# Patient Record
Sex: Female | Born: 1960
Health system: Southern US, Community
[De-identification: ages and names within clinical notes are randomized; demographics above are authoritative.]

## PROBLEM LIST (undated history)

## (undated) DIAGNOSIS — K219 Gastro-esophageal reflux disease without esophagitis: Secondary | ICD-10-CM

## (undated) DIAGNOSIS — J449 Chronic obstructive pulmonary disease, unspecified: Secondary | ICD-10-CM

## (undated) DIAGNOSIS — R519 Headache, unspecified: Secondary | ICD-10-CM

## (undated) DIAGNOSIS — T7840XA Allergy, unspecified, initial encounter: Secondary | ICD-10-CM

## (undated) DIAGNOSIS — J45909 Unspecified asthma, uncomplicated: Secondary | ICD-10-CM

## (undated) DIAGNOSIS — R51 Headache: Secondary | ICD-10-CM

## (undated) HISTORY — PX: POLYPECTOMY: SHX149

## (undated) HISTORY — PX: BREAST SURGERY: SHX581

## (undated) HISTORY — DX: Headache, unspecified: R51.9

## (undated) HISTORY — DX: Headache: R51

## (undated) HISTORY — PX: FRACTURE SURGERY: SHX138

## (undated) HISTORY — DX: Chronic obstructive pulmonary disease, unspecified: J44.9

## (undated) HISTORY — DX: Allergy, unspecified, initial encounter: T78.40XA

## (undated) HISTORY — DX: Gastro-esophageal reflux disease without esophagitis: K21.9

---

## 2002-05-25 HISTORY — PX: ABDOMINAL HYSTERECTOMY: SHX81

## 2006-06-05 ENCOUNTER — Emergency Department (HOSPITAL_COMMUNITY): Admission: EM | Admit: 2006-06-05 | Discharge: 2006-06-05 | Payer: Self-pay | Admitting: Family Medicine

## 2006-10-19 ENCOUNTER — Encounter (HOSPITAL_COMMUNITY): Admission: RE | Admit: 2006-10-19 | Discharge: 2006-10-20 | Payer: Self-pay | Admitting: Internal Medicine

## 2010-10-04 DIAGNOSIS — K769 Liver disease, unspecified: Secondary | ICD-10-CM | POA: Insufficient documentation

## 2011-10-07 ENCOUNTER — Ambulatory Visit: Payer: Self-pay | Admitting: Family Medicine

## 2014-03-19 ENCOUNTER — Ambulatory Visit: Payer: Self-pay | Admitting: Gastroenterology

## 2014-08-25 ENCOUNTER — Encounter: Payer: Self-pay | Admitting: Obstetrics and Gynecology

## 2014-11-08 ENCOUNTER — Encounter: Payer: Self-pay | Admitting: Obstetrics and Gynecology

## 2014-11-13 ENCOUNTER — Encounter: Payer: Self-pay | Admitting: Family Medicine

## 2014-11-13 DIAGNOSIS — J449 Chronic obstructive pulmonary disease, unspecified: Secondary | ICD-10-CM | POA: Insufficient documentation

## 2014-11-13 DIAGNOSIS — F324 Major depressive disorder, single episode, in partial remission: Secondary | ICD-10-CM | POA: Insufficient documentation

## 2014-11-13 DIAGNOSIS — G43009 Migraine without aura, not intractable, without status migrainosus: Secondary | ICD-10-CM | POA: Insufficient documentation

## 2014-11-13 DIAGNOSIS — K219 Gastro-esophageal reflux disease without esophagitis: Secondary | ICD-10-CM | POA: Insufficient documentation

## 2014-11-13 DIAGNOSIS — F325 Major depressive disorder, single episode, in full remission: Secondary | ICD-10-CM | POA: Insufficient documentation

## 2014-11-13 DIAGNOSIS — Z8742 Personal history of other diseases of the female genital tract: Secondary | ICD-10-CM | POA: Insufficient documentation

## 2014-11-13 DIAGNOSIS — E559 Vitamin D deficiency, unspecified: Secondary | ICD-10-CM | POA: Insufficient documentation

## 2014-11-13 DIAGNOSIS — Z85828 Personal history of other malignant neoplasm of skin: Secondary | ICD-10-CM | POA: Insufficient documentation

## 2014-11-13 DIAGNOSIS — F900 Attention-deficit hyperactivity disorder, predominantly inattentive type: Secondary | ICD-10-CM | POA: Insufficient documentation

## 2014-11-16 ENCOUNTER — Ambulatory Visit (INDEPENDENT_AMBULATORY_CARE_PROVIDER_SITE_OTHER): Payer: BLUE CROSS/BLUE SHIELD | Admitting: Family Medicine

## 2014-11-16 ENCOUNTER — Encounter: Payer: Self-pay | Admitting: Family Medicine

## 2014-11-16 VITALS — BP 116/80 | HR 86 | Temp 97.9°F | Resp 18 | Ht 62.0 in | Wt 140.5 lb

## 2014-11-16 DIAGNOSIS — Z13220 Encounter for screening for lipoid disorders: Secondary | ICD-10-CM

## 2014-11-16 DIAGNOSIS — G43009 Migraine without aura, not intractable, without status migrainosus: Secondary | ICD-10-CM

## 2014-11-16 DIAGNOSIS — F900 Attention-deficit hyperactivity disorder, predominantly inattentive type: Secondary | ICD-10-CM | POA: Diagnosis not present

## 2014-11-16 DIAGNOSIS — J449 Chronic obstructive pulmonary disease, unspecified: Secondary | ICD-10-CM | POA: Diagnosis not present

## 2014-11-16 DIAGNOSIS — M7550 Bursitis of unspecified shoulder: Secondary | ICD-10-CM | POA: Diagnosis not present

## 2014-11-16 DIAGNOSIS — Z79899 Other long term (current) drug therapy: Secondary | ICD-10-CM

## 2014-11-16 DIAGNOSIS — K219 Gastro-esophageal reflux disease without esophagitis: Secondary | ICD-10-CM | POA: Diagnosis not present

## 2014-11-16 DIAGNOSIS — F329 Major depressive disorder, single episode, unspecified: Secondary | ICD-10-CM | POA: Diagnosis not present

## 2014-11-16 DIAGNOSIS — F32A Depression, unspecified: Secondary | ICD-10-CM

## 2014-11-16 MED ORDER — AMPHETAMINE-DEXTROAMPHETAMINE 10 MG PO TABS: 10.0000 mg | ORAL_TABLET | Freq: Three times a day (TID) | ORAL | Status: DC

## 2014-11-16 MED ORDER — DESVENLAFAXINE SUCCINATE ER 50 MG PO TB24: 50.0000 mg | ORAL_TABLET | Freq: Every day | ORAL | Status: DC

## 2014-11-16 MED ORDER — SUMATRIPTAN SUCCINATE 100 MG PO TABS: 100.0000 mg | ORAL_TABLET | ORAL | Status: DC | PRN

## 2014-11-16 MED ORDER — IPRATROPIUM-ALBUTEROL 20-100 MCG/ACT IN AERS: 1.0000 | INHALATION_SPRAY | Freq: Four times a day (QID) | RESPIRATORY_TRACT | Status: DC

## 2014-11-16 MED ORDER — ALPRAZOLAM 0.5 MG PO TABS
0.5000 mg | ORAL_TABLET | ORAL | Status: DC | PRN
Start: 2014-11-16 — End: 2015-02-19

## 2014-11-16 MED ORDER — TOPIRAMATE 100 MG PO TABS: 100.0000 mg | ORAL_TABLET | Freq: Two times a day (BID) | ORAL | Status: DC

## 2014-11-16 NOTE — Progress Notes (Signed)
Name: Jane Patrick   MRN: [id number]    DOB: 07-19-1960   Date:11/16/2014       Progress Note  Subjective  Chief Complaint  Chief Complaint  Patient presents with  . Depression  . Anxiety  . COPD  . Headache    worsening having 6 per month  . Arm Pain    left worsening over a month    HPI  Migraine Headaches:  She states symptoms have been more often , about once weekly. She does not take Imitrex right away and can have pain all day. Associated with nausea and occasionally vomiting. Taking Topamax. She states she has been more stressed lately. Changes at work .  COPD Mild: She has been taking Combivent and also walks daily to control her symptoms.  She states no cough, wheezing but occasionally has some SOB, worse with weather changes.  ADHD: taking medication as prescribed, no side effects, symptoms have been controlled.   Depression/Anxiety: she has been taking Pristiq daily and Alprazolam prn, she also has a dog at home that helps her cope with stress.  No suicidal thoughts or ideation, no anhedonia.   Bilateral upper arm pain: she had a high desk at work and over the past couple of months she has noticed pain with abduction of arms, and worse when laying on lateral decubitus at night. No redness, no swelling, no tingling or numbness or arms. No trauma.  GERD: not on medication because it does not work , controlled with life style modification  Patient Active Problem List   Diagnosis Date Noted  . ADD (attention deficit hyperactivity disorder, inattentive type) 11/13/2014  . COPD, mild 11/13/2014  . Clinical depression 11/13/2014  . Basal cell carcinoma 11/13/2014  . Gastroesophageal reflux disease without esophagitis 11/13/2014  . History of abnormal cervical Pap smear 11/13/2014  . Migraine without aura and without status migrainosus, not intractable 11/13/2014  . Vitamin D deficiency 11/13/2014    Past Surgical History  Procedure Laterality Date  . Abdominal  hysterectomy  2004    partial  . Fracture surgery      rode in femur  . Breast surgery      biopsies  . Polypectomy      Family History  Problem Relation Age of Onset  . Colon cancer Father   . Breast cancer Sister     History   Social History  . Marital Status: Married    Spouse Name: N/A  . Number of Children: N/A  . Years of Education: N/A   Occupational History  . Not on file.   Social History Main Topics  . Smoking status: Former Smoker -- 1.00 packs/day for 25 years    Types: Cigarettes    Quit date: 05/25/2004  . Smokeless tobacco: Not on file  . Alcohol Use: No  . Drug Use: No  . Sexual Activity: Yes   Other Topics Concern  . Not on file   Social History Narrative     Current outpatient prescriptions:  .  ALPRAZolam (XANAX) 0.5 MG tablet, Take 1 tablet (0.5 mg total) by mouth as needed for anxiety., Disp: 30 tablet, Rfl: 2 .  amphetamine-dextroamphetamine (ADDERALL) 10 MG tablet, Take 1 tablet (10 mg total) by mouth 3 (three) times daily. 2 in am and one in pm, Disp: 90 tablet, Rfl: 0 .  cholecalciferol (VITAMIN D) 1000 UNITS tablet, Take 2,000 Units by mouth daily., Disp: , Rfl:  .  desvenlafaxine (PRISTIQ) 50 MG 24 hr tablet,  Take 1 tablet (50 mg total) by mouth daily., Disp: 30 tablet, Rfl: 5 .  Ipratropium-Albuterol (COMBIVENT) 20-100 MCG/ACT AERS respimat, Inhale 1 puff into the lungs every 6 (six) hours., Disp: 1 Inhaler, Rfl: 5 .  SUMAtriptan (IMITREX) 100 MG tablet, Take 1 tablet (100 mg total) by mouth as needed for migraine. May repeat in 2 hours if headache persists or recurs., Disp: 10 tablet, Rfl: 5 .  topiramate (TOPAMAX) 100 MG tablet, Take 1 tablet (100 mg total) by mouth 2 (two) times daily., Disp: 60 tablet, Rfl: 5 .  amphetamine-dextroamphetamine (ADDERALL) 10 MG tablet, Take 1 tablet (10 mg total) by mouth 3 (three) times daily. 2 in am and one in pm, Disp: 90 tablet, Rfl: 0 .  amphetamine-dextroamphetamine (ADDERALL) 10 MG tablet, Take 1  tablet (10 mg total) by mouth 3 (three) times daily. 2 in am and one in pm, Disp: 90 tablet, Rfl: 0  Allergies  Allergen Reactions  . Codeine      ROS  Constitutional: Negative for fever no significant weight change.  Respiratory: Negative for cough occasionally  shortness of breath.   Cardiovascular: Negative for chest pain or palpitations.  Gastrointestinal: Negative for abdominal pain, no bowel changes.  Musculoskeletal: Negative for gait problem or joint swelling.  Skin: Negative for rash.  Neurological: Negative for dizziness. Positive for migraine  headache.  No other specific complaints in a complete review of systems (except as listed in HPI above).  Objective  Filed Vitals:   11/16/14 0822  BP: 116/80  Pulse: 86  Temp: 97.9 F (36.6 C)  TempSrc: Oral  Resp: 18  Height:  (1.575 m)  Weight: 140 lb 8 oz (63.73 kg)  SpO2: 98%    Body mass index is 25.69 kg/(m^2).  Physical Exam   Constitutional: Patient appears well-developed and well-nourished. No distress.  Eyes:  No scleral icterus.  Neck: Normal range of motion. Neck supple. Cardiovascular: Normal rate, regular rhythm and normal heart sounds.  No murmur heard. No BLE edema. Pulmonary/Chest: Effort normal and breath sounds normal. No respiratory distress. Abdominal: Soft.  There is no tenderness. Psychiatric: Patient has a normal mood and affect. behavior is normal. Judgment and thought content normal. Muscular skeletal : pain during palpation of deltoid bilaterally. Pain with abduction of shoulders, no redness.   PHQ2/9: Depression screen PHQ 2/9 11/16/2014  Decreased Interest 0  Down, Depressed, Hopeless 0  PHQ - 2 Score 0    Fall Risk: Fall Risk  11/16/2014  Falls in the past year? No    Assessment & Plan   1. Migraine without aura and without status migrainosus, not intractable Increased secondary to stress, advised to take Imitrex as soon as symptoms starts.  - topiramate (TOPAMAX) 100  MG tablet; Take 1 tablet (100 mg total) by mouth 2 (two) times daily.  Dispense: 60 tablet; Refill: 5 - SUMAtriptan (IMITREX) 100 MG tablet; Take 1 tablet (100 mg total) by mouth as needed for migraine. May repeat in 2 hours if headache persists or recurs.  Dispense: 10 tablet; Refill: 5  2. Gastroesophageal reflux disease without esophagitis stable  3. Clinical depression We will wrist a letter stating that she needs a emotional support dog to help with her symptoms - ALPRAZolam (XANAX) 0.5 MG tablet; Take 1 tablet (0.5 mg total) by mouth as needed for anxiety.  Dispense: 30 tablet; Refill: 2  4. COPD, mild  - Ipratropium-Albuterol (COMBIVENT) 20-100 MCG/ACT AERS respimat; Inhale 1 puff into the lungs every 6 (six)  hours.  Dispense: 1 Inhaler; Refill: 5  5. ADD (attention deficit hyperactivity disorder, inattentive type)  - desvenlafaxine (PRISTIQ) 50 MG 24 hr tablet; Take 1 tablet (50 mg total) by mouth daily.  Dispense: 30 tablet; Refill: 5 - amphetamine-dextroamphetamine (ADDERALL) 10 MG tablet; Take 1 tablet (10 mg total) by mouth 3 (three) times daily. 2 in am and one in pm  Dispense: 90 tablet; Refill: 0 - amphetamine-dextroamphetamine (ADDERALL) 10 MG tablet; Take 1 tablet (10 mg total) by mouth 3 (three) times daily. 2 in am and one in pm  Dispense: 90 tablet; Refill: 0 - amphetamine-dextroamphetamine (ADDERALL) 10 MG tablet; Take 1 tablet (10 mg total) by mouth 3 (three) times daily. 2 in am and one in pm  Dispense: 90 tablet; Refill: 0  6. Lipid screening  - Lipid Profile  7. Long-term use of high-risk medication  - Comprehensive Metabolic Panel (CMET)  8. Bursitis/tendonitis, shoulder, unspecified laterality Discussed PT referral, injections, or otc ice and nsaid's prn, she wants to follow the later. She will also try a different desk at work , since she is constantly typing.

## 2014-11-16 NOTE — Patient Instructions (Signed)

## 2014-11-20 LAB — COMPREHENSIVE METABOLIC PANEL
A/G RATIO: 1.9 (ref 1.1–2.5)
ALK PHOS: 97 IU/L (ref 39–117)
ALT: 11 IU/L (ref 0–32)
AST: 12 IU/L (ref 0–40)
Albumin: 4.2 g/dL (ref 3.5–5.5)
BUN / CREAT RATIO: 22 (ref 9–23)
BUN: 19 mg/dL (ref 6–24)
Bilirubin Total: 0.2 mg/dL (ref 0.0–1.2)
CHLORIDE: 107 mmol/L (ref 97–108)
CO2: 25 mmol/L (ref 18–29)
CREATININE: 0.86 mg/dL (ref 0.57–1.00)
Calcium: 9.4 mg/dL (ref 8.7–10.2)
GFR, EST AFRICAN AMERICAN: 90 mL/min/{1.73_m2} (ref >59)
GFR, EST NON AFRICAN AMERICAN: 78 mL/min/{1.73_m2} (ref >59)
GLOBULIN, TOTAL: 2.2 g/dL (ref 1.5–4.5)
GLUCOSE: 89 mg/dL (ref 65–99)
POTASSIUM: 4.1 mmol/L (ref 3.5–5.2)
Sodium: 146 mmol/L — ABNORMAL HIGH (ref 134–144)
Total Protein: 6.4 g/dL (ref 6.0–8.5)

## 2014-11-20 LAB — LIPID PANEL
CHOL/HDL RATIO: 4.3 ratio (ref 0.0–4.4)
Cholesterol, Total: 201 mg/dL — ABNORMAL HIGH (ref 100–199)
HDL: 47 mg/dL (ref >39)
LDL Calculated: 123 mg/dL — ABNORMAL HIGH (ref 0–99)
Triglycerides: 157 mg/dL — ABNORMAL HIGH (ref 0–149)
VLDL CHOLESTEROL CAL: 31 mg/dL (ref 5–40)

## 2014-11-21 ENCOUNTER — Telehealth: Payer: Self-pay

## 2014-11-21 DIAGNOSIS — R5383 Other fatigue: Secondary | ICD-10-CM

## 2014-11-21 NOTE — Telephone Encounter (Signed)
Patient notified and wanted to ask if we could add a Vitamin D to her existing blood work due to being Fatigue.

## 2014-11-21 NOTE — Telephone Encounter (Signed)
-----   Message from Alba CoryKrichna Sowles, MD sent at 11/21/2014  3:02 PM EDT ----- Lipid panel is at goal for her, ASCVD is 1.5% in the next 10 years, no need for statins at this time Sugar , kidney and liver functions are within normal limits Please notify patient, thank you

## 2014-11-22 NOTE — Telephone Encounter (Signed)
I ordered CBC, vitamin D , B12 and TSH if you can add it to current labs. Thank you

## 2014-11-22 NOTE — Telephone Encounter (Signed)
We could add all the chemistry labs, Vitamin D, B12, and TSH. But she did not have any lavendar's draw so they could add the CBC.

## 2014-11-23 LAB — VITAMIN B12: VITAMIN B 12: 201 pg/mL — AB (ref 211–946)

## 2014-11-23 LAB — TSH: TSH: 4.22 u[IU]/mL (ref 0.450–4.500)

## 2014-11-23 LAB — VITAMIN D 25 HYDROXY (VIT D DEFICIENCY, FRACTURES): VIT D 25 HYDROXY: 32.2 ng/mL (ref 30.0–100.0)

## 2014-11-23 NOTE — Progress Notes (Signed)
Patient notified

## 2014-11-24 LAB — SPECIMEN STATUS REPORT

## 2014-11-27 ENCOUNTER — Telehealth: Payer: Self-pay | Admitting: Family Medicine

## 2014-11-27 NOTE — Telephone Encounter (Signed)
She can come in for B12 shot

## 2014-11-27 NOTE — Telephone Encounter (Signed)
Pt was told that her b-12 level was low and was advised to take something over the counter, however patient is not able to find it and is requesting to come in for a b-12 shot. Is it possible for you to write her a prescription for this? Please return call once complete and it is okay to leave a detailed message. [phone]

## 2014-11-28 ENCOUNTER — Encounter: Payer: Self-pay | Admitting: Family Medicine

## 2014-11-28 ENCOUNTER — Ambulatory Visit (INDEPENDENT_AMBULATORY_CARE_PROVIDER_SITE_OTHER): Payer: BLUE CROSS/BLUE SHIELD

## 2014-11-28 DIAGNOSIS — D519 Vitamin B12 deficiency anemia, unspecified: Secondary | ICD-10-CM

## 2014-11-28 MED ORDER — CYANOCOBALAMIN 1000 MCG/ML IJ SOLN
1000.0000 ug | Freq: Once | INTRAMUSCULAR | Status: AC
  Administered 2014-11-28: 1000 ug via INTRAMUSCULAR

## 2015-01-29 ENCOUNTER — Other Ambulatory Visit: Payer: Self-pay

## 2015-01-29 DIAGNOSIS — F900 Attention-deficit hyperactivity disorder, predominantly inattentive type: Secondary | ICD-10-CM

## 2015-02-19 ENCOUNTER — Other Ambulatory Visit: Payer: Self-pay

## 2015-02-19 ENCOUNTER — Encounter: Payer: Self-pay | Admitting: Family Medicine

## 2015-02-19 ENCOUNTER — Ambulatory Visit (INDEPENDENT_AMBULATORY_CARE_PROVIDER_SITE_OTHER): Payer: 59 | Admitting: Family Medicine

## 2015-02-19 VITALS — BP 104/70 | HR 82 | Temp 97.9°F | Resp 16 | Ht 62.0 in | Wt 146.6 lb

## 2015-02-19 DIAGNOSIS — F900 Attention-deficit hyperactivity disorder, predominantly inattentive type: Secondary | ICD-10-CM

## 2015-02-19 DIAGNOSIS — J449 Chronic obstructive pulmonary disease, unspecified: Secondary | ICD-10-CM

## 2015-02-19 DIAGNOSIS — E559 Vitamin D deficiency, unspecified: Secondary | ICD-10-CM | POA: Diagnosis not present

## 2015-02-19 DIAGNOSIS — Z23 Encounter for immunization: Secondary | ICD-10-CM

## 2015-02-19 DIAGNOSIS — F32A Depression, unspecified: Secondary | ICD-10-CM

## 2015-02-19 DIAGNOSIS — G43009 Migraine without aura, not intractable, without status migrainosus: Secondary | ICD-10-CM

## 2015-02-19 DIAGNOSIS — R319 Hematuria, unspecified: Secondary | ICD-10-CM

## 2015-02-19 DIAGNOSIS — E538 Deficiency of other specified B group vitamins: Secondary | ICD-10-CM | POA: Diagnosis not present

## 2015-02-19 DIAGNOSIS — F329 Major depressive disorder, single episode, unspecified: Secondary | ICD-10-CM | POA: Diagnosis not present

## 2015-02-19 DIAGNOSIS — Z8742 Personal history of other diseases of the female genital tract: Secondary | ICD-10-CM | POA: Diagnosis not present

## 2015-02-19 DIAGNOSIS — F324 Major depressive disorder, single episode, in partial remission: Secondary | ICD-10-CM

## 2015-02-19 DIAGNOSIS — K7689 Other specified diseases of liver: Secondary | ICD-10-CM

## 2015-02-19 DIAGNOSIS — K769 Liver disease, unspecified: Secondary | ICD-10-CM

## 2015-02-19 MED ORDER — SUMATRIPTAN SUCCINATE 100 MG PO TABS: 100.0000 mg | ORAL_TABLET | ORAL | Status: DC | PRN

## 2015-02-19 MED ORDER — IPRATROPIUM-ALBUTEROL 20-100 MCG/ACT IN AERS: 1.0000 | INHALATION_SPRAY | Freq: Four times a day (QID) | RESPIRATORY_TRACT | Status: DC

## 2015-02-19 MED ORDER — TOPIRAMATE 100 MG PO TABS: 100.0000 mg | ORAL_TABLET | Freq: Two times a day (BID) | ORAL | Status: DC

## 2015-02-19 MED ORDER — AMPHETAMINE-DEXTROAMPHETAMINE 10 MG PO TABS: 10.0000 mg | ORAL_TABLET | Freq: Three times a day (TID) | ORAL | Status: DC

## 2015-02-19 MED ORDER — CYANOCOBALAMIN 1000 MCG/ML IJ SOLN
1000.0000 ug | Freq: Once | INTRAMUSCULAR | Status: AC
  Administered 2015-02-19: 1000 ug via INTRAMUSCULAR

## 2015-02-19 MED ORDER — VITAMIN B-12 1000 MCG PO TABS: 1000.0000 ug | ORAL_TABLET | Freq: Every day | ORAL | Status: DC

## 2015-02-19 MED ORDER — ALPRAZOLAM 0.5 MG PO TABS: 0.5000 mg | ORAL_TABLET | Freq: Every evening | ORAL | Status: DC | PRN

## 2015-02-19 MED ORDER — DESVENLAFAXINE SUCCINATE ER 50 MG PO TB24: 50.0000 mg | ORAL_TABLET | Freq: Every day | ORAL | Status: DC

## 2015-02-19 NOTE — Progress Notes (Signed)
Name: Jane Patrick   MRN: [id number]    DOB: [birth year]   Date:02/19/2015       Progress Note  Subjective  Chief Complaint  Chief Complaint  Patient presents with  . Medication Refill    follow-up  . Depression  . Migraine  . ADHD    HPI  Abnormal pap smear in 19-Oct-2010: she was referred to [facility], and advised to see [name] but never followed through.  Explained the risk of worsening of vaginal wall pathology/caner. She has a TAH in 10-19-2002 for treatment of abnormal cervical cells  Hematuria: seen at [facility] in 10-19-2010 and advised by them to follow up in 6 months, but did not go back, she does not want to go back now. She has a long history of hematuria and in one of the studies she has a liver lesion, but was offered multiple times to have a repeat CT scan but she continues to refuse.   ADD: she now has a new job. Working for [employer]. They are located in [place], she works from home. She states medication helps her focus.   COPD Mild: occasional SOB with weather changes, no cough, no decrease in exercise tolerance. No longer smoking.  She uses Combivent daily for symptoms.   B12 deficiency: found in June 2016, had one B12 injection and felt better, still taking otc SL but would like another injection  Major Depression in partial Remission: on Pristiq and Alprazolam prn. Symptoms started when husband was diagnosed with lung cancer and died in 10/18/08.  She is now mostly stressed with learning a new job. No crying spells, but she has lack of energy, she has some anhedonia.    Patient Active Problem List   Diagnosis Date Noted  . Hematuria 02/19/2015  . B12 deficiency 02/19/2015  . ADD (attention deficit hyperactivity disorder, inattentive type) 11/13/2014  . COPD, mild 11/13/2014  . Major depression in partial remission 11/13/2014  . History of basal cell carcinoma 11/13/2014  . Gastroesophageal reflux disease without esophagitis 11/13/2014  . History of abnormal  cervical Pap smear 11/13/2014  . Migraine without aura and without status migrainosus, not intractable 11/13/2014  . Vitamin D deficiency 11/13/2014  . Liver lesion 10/04/2010    Past Surgical History  Procedure Laterality Date  . Abdominal hysterectomy  2002-10-19    partial  . Fracture surgery      rode in femur  . Breast surgery      biopsies  . Polypectomy      Family History  Problem Relation Age of Onset  . Colon cancer Father   . Breast cancer Sister     Social History   Social History  . Marital Status: Married    Spouse Name: N/A  . Number of Children: N/A  . Years of Education: N/A   Occupational History  . Not on file.   Social History Main Topics  . Smoking status: Former Smoker -- 1.00 packs/day for 25 years    Types: Cigarettes    Quit date: 05/25/2004  . Smokeless tobacco: Never Used  . Alcohol Use: No  . Drug Use: No  . Sexual Activity: Yes   Other Topics Concern  . Not on file   Social History Narrative     Current outpatient prescriptions:  .  ALPRAZolam (XANAX) 0.5 MG tablet, Take 1 tablet (0.5 mg total) by mouth at bedtime as needed for anxiety., Disp: 30 tablet, Rfl: 2 .  amphetamine-dextroamphetamine (ADDERALL) 10  MG tablet, Take 1 tablet (10 mg total) by mouth 3 (three) times daily. 2 in am and one in pm, Disp: 90 tablet, Rfl: 0 .  amphetamine-dextroamphetamine (ADDERALL) 10 MG tablet, Take 1 tablet (10 mg total) by mouth 3 (three) times daily. 2 in am and one in pm, Disp: 90 tablet, Rfl: 0 .  amphetamine-dextroamphetamine (ADDERALL) 10 MG tablet, Take 1 tablet (10 mg total) by mouth 3 (three) times daily. 2 in am and one in pm, Disp: 90 tablet, Rfl: 0 .  cholecalciferol (VITAMIN D) 1000 UNITS tablet, Take 2,000 Units by mouth daily., Disp: , Rfl:  .  desvenlafaxine (PRISTIQ) 50 MG 24 hr tablet, Take 1 tablet (50 mg total) by mouth daily., Disp: 30 tablet, Rfl: 5 .  Ipratropium-Albuterol (COMBIVENT) 20-100 MCG/ACT AERS respimat, Inhale 1 puff  into the lungs every 6 (six) hours., Disp: 3 Inhaler, Rfl: 1 .  SUMAtriptan (IMITREX) 100 MG tablet, Take 1 tablet (100 mg total) by mouth as needed for migraine. May repeat in 2 hours if headache persists or recurs., Disp: 18 tablet, Rfl: 1 .  topiramate (TOPAMAX) 100 MG tablet, Take 1 tablet (100 mg total) by mouth 2 (two) times daily., Disp: 180 tablet, Rfl: 1 .  vitamin B-12 (CYANOCOBALAMIN) 1000 MCG tablet, Take 1 tablet (1,000 mcg total) by mouth daily., Disp: 30 tablet, Rfl: 0  Allergies  Allergen Reactions  . Codeine   . Pantoprazole     Flu like syptoms     ROS  Constitutional: Negative for fever or weight change.  Respiratory: Negative for cough and shortness of breath.   Cardiovascular: Negative for chest pain or palpitations.  Gastrointestinal: Negative for abdominal pain, no bowel changes.  Musculoskeletal: Negative for gait problem or joint swelling.  Skin: Negative for rash.  Neurological: Negative for dizziness or headache.  No other specific complaints in a complete review of systems (except as listed in HPI above).  Objective  Filed Vitals:   02/19/15 0811  BP: 104/70  Pulse: 82  Temp: 97.9 F (36.6 C)  TempSrc: Oral  Resp: 16  Height:  (1.575 m)  Weight: 146 lb 9.6 oz (66.497 kg)  SpO2: 95%    Body mass index is 26.81 kg/(m^2).  Physical Exam  Constitutional: Patient appears well-developed and well-nourished. No distress.  HEENT: head atraumatic, normocephalic, pupils equal and reactive to light, neck supple, throat within normal limits Cardiovascular: Normal rate, regular rhythm and normal heart sounds.  No murmur heard. No BLE edema. Pulmonary/Chest: Effort normal and breath sounds normal. No respiratory distress. Abdominal: Soft.  There is no tenderness. Psychiatric: Patient has a normal mood and affect. behavior is normal. Judgment and thought content normal.  PHQ2/9: Depression screen [facility] 2/9 02/19/2015 11/16/2014  Decreased Interest 0 0   Down, Depressed, Hopeless 0 0  PHQ - 2 Score 0 0    Fall Risk: Fall Risk  02/19/2015 11/16/2014 11/16/2014  Falls in the past year? No No No    Functional Status Survey: Is the patient deaf or have difficulty hearing?: No Does the patient have difficulty seeing, even when wearing glasses/contacts?: Yes (reading glasses) Does the patient have difficulty concentrating, remembering, or making decisions?: No Does the patient have difficulty walking or climbing stairs?: No Does the patient have difficulty dressing or bathing?: No Does the patient have difficulty doing errands alone such as visiting a doctor's office or shopping?: No    Assessment & Plan  1. History of abnormal cervical Pap smear  - Ambulatory  referral to Gynecology  2. Needs flu shot  - Flu Vaccine QUAD 36+ mos PF IM (Fluarix & Fluzone Quad PF) - refused  3. Liver lesion  Refuses to have CT at this time  4. ADD (attention deficit hyperactivity disorder, inattentive type)  Doing well on medication  - desvenlafaxine (PRISTIQ) 50 MG 24 hr tablet; Take 1 tablet (50 mg total) by mouth daily.  Dispense: 30 tablet; Refill: 5 - amphetamine-dextroamphetamine (ADDERALL) 10 MG tablet; Take 1 tablet (10 mg total) by mouth 3 (three) times daily. 2 in am and one in pm  Dispense: 90 tablet; Refill: 0 - amphetamine-dextroamphetamine (ADDERALL) 10 MG tablet; Take 1 tablet (10 mg total) by mouth 3 (three) times daily. 2 in am and one in pm  Dispense: 90 tablet; Refill: 0 - amphetamine-dextroamphetamine (ADDERALL) 10 MG tablet; Take 1 tablet (10 mg total) by mouth 3 (three) times daily. 2 in am and one in pm  Dispense: 90 tablet; Refill: 0  5. Major depressive disorder, single episode, in partial remission  _Pristiq 50 mg daily  - ALPRAZolam (XANAX) 0.5 MG tablet; Take 1 tablet (0.5 mg total) by mouth at bedtime as needed for anxiety.  Dispense: 30 tablet; Refill: 2  6. Vitamin D deficiency   continue medication daily   7.  Migraine without aura and without status migrainosus, not intractable  Doing well at this time, only one or two episodes in the past 3 months - topiramate (TOPAMAX) 100 MG tablet; Take 1 tablet (100 mg total) by mouth 2 (two) times daily.  Dispense: 180 tablet; Refill: 1 - SUMAtriptan (IMITREX) 100 MG tablet; Take 1 tablet (100 mg total) by mouth as needed for migraine. May repeat in 2 hours if headache persists or recurs.  Dispense: 18 tablet; Refill: 1  8. Hematuria  Discussed importance of going back to Urologist at [facility] - Dr. Solmon Ice, but she still does not want to go  9. COPD, mild  - Ipratropium-Albuterol (COMBIVENT) 20-100 MCG/ACT AERS respimat; Inhale 1 puff into the lungs every 6 (six) hours.  Dispense: 3 Inhaler; Refill: 1   10. B12 deficiency

## 2015-02-19 NOTE — Telephone Encounter (Signed)
Patient requesting refill. 

## 2015-02-20 ENCOUNTER — Other Ambulatory Visit: Payer: Self-pay

## 2015-02-20 DIAGNOSIS — J449 Chronic obstructive pulmonary disease, unspecified: Secondary | ICD-10-CM

## 2015-02-20 MED ORDER — IPRATROPIUM-ALBUTEROL 20-100 MCG/ACT IN AERS: 1.0000 | INHALATION_SPRAY | Freq: Four times a day (QID) | RESPIRATORY_TRACT | Status: DC

## 2015-05-22 ENCOUNTER — Encounter: Payer: Self-pay | Admitting: Family Medicine

## 2015-05-22 ENCOUNTER — Ambulatory Visit (INDEPENDENT_AMBULATORY_CARE_PROVIDER_SITE_OTHER): Payer: 59 | Admitting: Family Medicine

## 2015-05-22 VITALS — BP 102/64 | HR 96 | Temp 98.2°F | Resp 16 | Ht 62.0 in | Wt 147.7 lb

## 2015-05-22 DIAGNOSIS — J449 Chronic obstructive pulmonary disease, unspecified: Secondary | ICD-10-CM

## 2015-05-22 DIAGNOSIS — G43009 Migraine without aura, not intractable, without status migrainosus: Secondary | ICD-10-CM

## 2015-05-22 DIAGNOSIS — Z9119 Patient's noncompliance with other medical treatment and regimen: Secondary | ICD-10-CM | POA: Diagnosis not present

## 2015-05-22 DIAGNOSIS — F900 Attention-deficit hyperactivity disorder, predominantly inattentive type: Secondary | ICD-10-CM

## 2015-05-22 DIAGNOSIS — F324 Major depressive disorder, single episode, in partial remission: Secondary | ICD-10-CM

## 2015-05-22 DIAGNOSIS — Z8742 Personal history of other diseases of the female genital tract: Secondary | ICD-10-CM

## 2015-05-22 DIAGNOSIS — Z91199 Patient's noncompliance with other medical treatment and regimen due to unspecified reason: Secondary | ICD-10-CM

## 2015-05-22 MED ORDER — AMPHETAMINE-DEXTROAMPHETAMINE 10 MG PO TABS: 10.0000 mg | ORAL_TABLET | Freq: Three times a day (TID) | ORAL | Status: DC

## 2015-05-22 NOTE — Progress Notes (Signed)
Name: Jane Patrick   MRN: [id number]    DOB: [birth year]   Date:05/22/2015       Progress Note  Subjective  Chief Complaint  Chief Complaint  Patient presents with  . Medication Refill    3 month follow-up  . ADHD  . Depression  . Migraine  . Asthma    HPI   Migraine headache: she is on Topamax for prevention, described as throbbing, usually triggered by barometric pressure changes. Imitrex helps with symptoms, takes about 45 minutes to resolve the symptoms.   Abnormal pap smear in 27-Sep-2010: she was referred to [facility], and advised to see [name] but never followed through. Explained the risk of worsening of vaginal wall pathology/caner. She has a TAH in 09-27-02 for treatment of abnormal cervical cells. She has missed another appointment and is trying to see a local GYN, [name] at [facility] before going to [facility] - she will schedule the appointment today  Hematuria: seen at [facility] in 2010-09-27 and advised by them to follow up in 6 months, but did not go back, she does not want to go back now. She has a long history of hematuria and in one of the studies she has a liver lesion, but was offered multiple times to have a repeat CT scan but she continues to refuse. She still has not gone back yet. She wants to check coverage with her insurance first  ADD: she now has a new job. Working for [employer]. They are located in [place], she works from home. She states medication helps her focus, adjusting well to the new job. No side effects of medication   COPD Mild: occasional SOB , no cough, no wheezing, no decrease in exercise tolerance. No longer smoking. She uses Combivent daily for symptoms - usually for SOB.   B12 deficiency: found in June 2016, had one B12 injection and felt better, still taking otc SL but would like another injection  Major Depression in partial Remission: on Pristiq and Alprazolam prn. Symptoms started when husband was diagnosed with lung cancer and died  in 2008/09/26.She does not want to change medication or see a Psychiatrist.  She is now mostly stressed with learning a new job. No crying spells, but she has lack of energy, she has some anhedonia.   Patient Active Problem List   Diagnosis Date Noted  . Hematuria 02/19/2015  . B12 deficiency 02/19/2015  . ADD (attention deficit hyperactivity disorder, inattentive type) 11/13/2014  . COPD, mild (HCC) 11/13/2014  . Major depression in partial remission (HCC) 11/13/2014  . History of basal cell carcinoma 11/13/2014  . Gastroesophageal reflux disease without esophagitis 11/13/2014  . History of abnormal cervical Pap smear 11/13/2014  . Migraine without aura and without status migrainosus, not intractable 11/13/2014  . Vitamin D deficiency 11/13/2014  . Liver lesion 10/04/2010    Past Surgical History  Procedure Laterality Date  . Abdominal hysterectomy  09-27-02    partial  . Fracture surgery      rode in femur  . Breast surgery      biopsies  . Polypectomy      Family History  Problem Relation Age of Onset  . Colon cancer Father   . Breast cancer Sister     Social History   Social History  . Marital Status: Married    Spouse Name: N/A  . Number of Children: N/A  . Years of Education: N/A   Occupational History  . Not on  file.   Social History Main Topics  . Smoking status: Former Smoker -- 1.00 packs/day for 25 years    Types: Cigarettes    Quit date: 05/25/2004  . Smokeless tobacco: Never Used  . Alcohol Use: No  . Drug Use: No  . Sexual Activity: Yes   Other Topics Concern  . Not on file   Social History Narrative     Current outpatient prescriptions:  .  ALPRAZolam (XANAX) 0.5 MG tablet, Take 1 tablet (0.5 mg total) by mouth at bedtime as needed for anxiety., Disp: 30 tablet, Rfl: 2 .  amphetamine-dextroamphetamine (ADDERALL) 10 MG tablet, Take 1 tablet (10 mg total) by mouth 3 (three) times daily. 2 in am and one in pm, Disp: 90 tablet, Rfl: 0 .   amphetamine-dextroamphetamine (ADDERALL) 10 MG tablet, Take 1 tablet (10 mg total) by mouth 3 (three) times daily. 2 in am and one in pm, Disp: 90 tablet, Rfl: 0 .  amphetamine-dextroamphetamine (ADDERALL) 10 MG tablet, Take 1 tablet (10 mg total) by mouth 3 (three) times daily. 2 in am and one in pm, Disp: 90 tablet, Rfl: 0 .  cholecalciferol (VITAMIN D) 1000 UNITS tablet, Take 2,000 Units by mouth daily., Disp: , Rfl:  .  desvenlafaxine (PRISTIQ) 50 MG 24 hr tablet, Take 1 tablet (50 mg total) by mouth daily., Disp: 30 tablet, Rfl: 5 .  Ipratropium-Albuterol (COMBIVENT) 20-100 MCG/ACT AERS respimat, Inhale 1 puff into the lungs every 6 (six) hours., Disp: 3 Inhaler, Rfl: 1 .  SUMAtriptan (IMITREX) 100 MG tablet, Take 1 tablet (100 mg total) by mouth as needed for migraine. May repeat in 2 hours if headache persists or recurs., Disp: 18 tablet, Rfl: 1 .  topiramate (TOPAMAX) 100 MG tablet, Take 1 tablet (100 mg total) by mouth 2 (two) times daily., Disp: 180 tablet, Rfl: 1 .  vitamin B-12 (CYANOCOBALAMIN) 1000 MCG tablet, Take 1 tablet (1,000 mcg total) by mouth daily., Disp: 30 tablet, Rfl: 0  Allergies  Allergen Reactions  . Codeine   . Pantoprazole     Flu like syptoms     ROS  Constitutional: Negative for fever or weight change.  Respiratory: Negative for cough , occasional shortness of breath.   Cardiovascular: Negative for chest pain or palpitations.  Gastrointestinal: Negative for abdominal pain, no bowel changes.  Musculoskeletal: Negative for gait problem or joint swelling.  Skin: Negative for rash.  Neurological: Negative for dizziness or headache.  No other specific complaints in a complete review of systems (except as listed in HPI above).  Objective  Filed Vitals:   05/22/15 0807  BP: 102/64  Pulse: 96  Temp: 98.2 F (36.8 C)  TempSrc: Oral  Resp: 16  Height: 5\' 2"  (1.575 m)  Weight: 147 lb 11.2 oz (66.996 kg)  SpO2: 96%    Body mass index is 27.01  kg/(m^2).  Physical Exam  Constitutional: Patient appears well-developed and well-nourished.  No distress.  HEENT: head atraumatic, normocephalic, pupils equal and reactive to light, , neck supple, throat within normal limits Cardiovascular: Normal rate, regular rhythm and normal heart sounds.  No murmur heard. No BLE edema. Pulmonary/Chest: Effort normal and breath sounds normal. No respiratory distress. Abdominal: Soft.  There is no tenderness. Psychiatric: Patient has a normal mood and affect. behavior is normal. Judgment and thought content normal.  PHQ2/9: Depression screen [facility] 2/9 05/22/2015 02/19/2015 11/16/2014  Decreased Interest 0 0 0  Down, Depressed, Hopeless 0 0 0  PHQ - 2 Score 0 0 0  Fall Risk: Fall Risk  05/22/2015 02/19/2015 11/16/2014 11/16/2014  Falls in the past year? No No No No     Functional Status Survey: Is the patient deaf or have difficulty hearing?: No Does the patient have difficulty seeing, even when wearing glasses/contacts?: Yes (reading glasses) Does the patient have difficulty concentrating, remembering, or making decisions?: No Does the patient have difficulty walking or climbing stairs?: No Does the patient have difficulty dressing or bathing?: No Does the patient have difficulty doing errands alone such as visiting a doctor's office or shopping?: No    Assessment & Plan  1. Migraine without aura and without status migrainosus, not intractable  Doing well continue medication   2. Major depressive disorder, single episode, in partial remission (HCC)  Continue medication, still not in full remission but stable and does not want to see a Psychiatrist   3. ADD (attention deficit hyperactivity disorder, inattentive type)  - amphetamine-dextroamphetamine (ADDERALL) 10 MG tablet; Take 1 tablet (10 mg total) by mouth 3 (three) times daily. 2 in am and one in pm  Dispense: 90 tablet; Refill: 0 - amphetamine-dextroamphetamine (ADDERALL) 10 MG  tablet; Take 1 tablet (10 mg total) by mouth 3 (three) times daily. 2 in am and one in pm  Dispense: 90 tablet; Refill: 0 - amphetamine-dextroamphetamine (ADDERALL) 10 MG tablet; Take 1 tablet (10 mg total) by mouth 3 (three) times daily. 2 in am and one in pm  Dispense: 90 tablet; Refill: 0  4. COPD, mild (HCC)  Continue Combivent prn   5. History of abnormal cervical Pap smear  Explained importance of following with GYN asap, she also needs to check liver lesion and mammogram is not up to date  6. Non compliance with medical treatment

## 2015-07-18 ENCOUNTER — Encounter: Payer: Self-pay | Admitting: Family Medicine

## 2015-07-19 ENCOUNTER — Other Ambulatory Visit: Payer: Self-pay | Admitting: Family Medicine

## 2015-07-19 ENCOUNTER — Telehealth: Payer: Self-pay

## 2015-07-19 MED ORDER — CYANOCOBALAMIN 1000 MCG/ML IJ KIT: 1000.0000 mg | PACK | INTRAMUSCULAR | Status: DC

## 2015-07-19 NOTE — Telephone Encounter (Signed)
Got a call from [facility] needing instructions for the B12 injection as well as the diagnosis.

## 2015-07-31 ENCOUNTER — Telehealth: Payer: Self-pay

## 2015-07-31 NOTE — Telephone Encounter (Signed)
Got a call from [facility] asking for specifics regarding this pt's rx for b12 injections. She was informed it was supposed to go to [facility] instead of [facility] so she cancelled their order.

## 2015-08-20 ENCOUNTER — Ambulatory Visit (INDEPENDENT_AMBULATORY_CARE_PROVIDER_SITE_OTHER): Payer: 59 | Admitting: Family Medicine

## 2015-08-20 ENCOUNTER — Encounter: Payer: Self-pay | Admitting: Family Medicine

## 2015-08-20 VITALS — BP 102/66 | HR 88 | Temp 98.0°F | Resp 14 | Ht 62.0 in | Wt 148.9 lb

## 2015-08-20 DIAGNOSIS — F900 Attention-deficit hyperactivity disorder, predominantly inattentive type: Secondary | ICD-10-CM | POA: Diagnosis not present

## 2015-08-20 DIAGNOSIS — Z8619 Personal history of other infectious and parasitic diseases: Secondary | ICD-10-CM | POA: Diagnosis not present

## 2015-08-20 DIAGNOSIS — J449 Chronic obstructive pulmonary disease, unspecified: Secondary | ICD-10-CM

## 2015-08-20 DIAGNOSIS — G43009 Migraine without aura, not intractable, without status migrainosus: Secondary | ICD-10-CM

## 2015-08-20 DIAGNOSIS — F324 Major depressive disorder, single episode, in partial remission: Secondary | ICD-10-CM

## 2015-08-20 DIAGNOSIS — E538 Deficiency of other specified B group vitamins: Secondary | ICD-10-CM

## 2015-08-20 MED ORDER — AMPHETAMINE-DEXTROAMPHETAMINE 10 MG PO TABS: 10.0000 mg | ORAL_TABLET | Freq: Three times a day (TID) | ORAL | Status: DC

## 2015-08-20 MED ORDER — DESVENLAFAXINE SUCCINATE ER 50 MG PO TB24: 50.0000 mg | ORAL_TABLET | Freq: Every day | ORAL | Status: DC

## 2015-08-20 MED ORDER — VALACYCLOVIR HCL 500 MG PO TABS: 500.0000 mg | ORAL_TABLET | Freq: Two times a day (BID) | ORAL | Status: DC

## 2015-08-20 MED ORDER — TOPIRAMATE 100 MG PO TABS: 100.0000 mg | ORAL_TABLET | Freq: Two times a day (BID) | ORAL | Status: DC

## 2015-08-20 NOTE — Progress Notes (Signed)
Name: Jane Patrick   MRN: [id number]    DOB: 1961/04/04   Date:08/20/2015       Progress Note  Subjective  Chief Complaint  Chief Complaint  Patient presents with  . Follow-up    medications  . ADHD  . Insomnia    HPI  Migraine headache: she is on Topamax for prevention,  usually triggered by barometric pressure changes. Imitrex helps with symptoms, takes about 45 minutes to resolve the symptoms. Symptoms are described as throbbing, usually temporal but can radiate to the entire head. It is associates with nausea and vomiting, at times associated with phonophobia and photophobia.   Abnormal pap smear in Jul 11, 2010: she was referred to [facility], and advised to see Dr. Merilyn Baba but never followed through. She has a TAH in 11-Jul-2002 for treatment of abnormal cervical cells. She was seen by Dr. Leafy Ro at [facility] and last pap done in Feb 2017 was normal with positive HPV and will go back for follow up.  Herpes Type II: she had a small lesion when she was seen by Dr. Leafy Ro and culture was positive for herpes, she was given Valtrex, no other outbreaks since. Boyfriend has been notified.   Hematuria: seen at [facility] in 2010-07-11 and advised by them to follow up in 6 months, but did not go back, she does not want to go back now. She has a long history of hematuria and in one of the studies she has a liver lesion, but was offered multiple times to have a repeat CT scan but she continues to refuse. She still has not gone back yet. She wants to check coverage with her insurance first  ADD: she now has a new job. Working for [employer]. They are located in [place], she works from home. She states medication helps her focus, adjusting well to the new job. No side effects of medication . Skipping Adderal at times since working from home has less distractions.   COPD Mild: occasional SOB , no cough, no wheezing, no decrease in exercise tolerance. No longer smoking. She uses Combivent intermittently  - usually for SOB.   B12 deficiency: found in June 2016, she is getting B12 at home, her daughter is a CMA.  Major Depression in partial Remission: on Pristiq and Alprazolam prn. Symptoms started when husband was diagnosed with lung cancer and died in 2008-07-11.She does not want to change medication or see a Psychiatrist. She is now mostly stressed with learning a new job. No crying spells, she states the anhedonia has improved.    Patient Active Problem List   Diagnosis Date Noted  . H/O herpes simplex type 2 infection 08/20/2015  . Hematuria 02/19/2015  . B12 deficiency 02/19/2015  . ADD (attention deficit hyperactivity disorder, inattentive type) 11/13/2014  . COPD, mild ([place]) 11/13/2014  . Major depression in partial remission ([place]) 11/13/2014  . History of basal cell carcinoma 11/13/2014  . Gastroesophageal reflux disease without esophagitis 11/13/2014  . History of abnormal cervical Pap smear 11/13/2014  . Migraine without aura and without status migrainosus, not intractable 11/13/2014  . Vitamin D deficiency 11/13/2014  . Liver lesion 10/04/2010    Past Surgical History  Procedure Laterality Date  . Abdominal hysterectomy  2002-07-11    partial  . Fracture surgery      rode in femur  . Breast surgery      biopsies  . Polypectomy      Family History  Problem Relation Age of Onset  .  Colon cancer Father   . Breast cancer Sister     Social History   Social History  . Marital Status: Married    Spouse Name: N/A  . Number of Children: N/A  . Years of Education: N/A   Occupational History  . Not on file.   Social History Main Topics  . Smoking status: Former Smoker -- 1.00 packs/day for 25 years    Types: Cigarettes    Quit date: 05/25/2004  . Smokeless tobacco: Never Used  . Alcohol Use: No  . Drug Use: No  . Sexual Activity: Yes   Other Topics Concern  . Not on file   Social History Narrative     Current outpatient prescriptions:  .  ALPRAZolam (XANAX) 0.5  MG tablet, Take 1 tablet (0.5 mg total) by mouth at bedtime as needed for anxiety., Disp: 30 tablet, Rfl: 2 .  amphetamine-dextroamphetamine (ADDERALL) 10 MG tablet, Take 1 tablet (10 mg total) by mouth 3 (three) times daily. 2 in am and one in pm, Disp: 90 tablet, Rfl: 0 .  amphetamine-dextroamphetamine (ADDERALL) 10 MG tablet, Take 1 tablet (10 mg total) by mouth 3 (three) times daily. 2 in am and one in pm, Disp: 90 tablet, Rfl: 0 .  amphetamine-dextroamphetamine (ADDERALL) 10 MG tablet, Take 1 tablet (10 mg total) by mouth 3 (three) times daily. 2 in am and one in pm, Disp: 90 tablet, Rfl: 0 .  cholecalciferol (VITAMIN D) 1000 UNITS tablet, Take 2,000 Units by mouth daily., Disp: , Rfl:  .  Cyanocobalamin 1000 MCG/ML KIT, Inject 1,000 mg as directed every 30 (thirty) days., Disp: 1 kit, Rfl: 5 .  desvenlafaxine (PRISTIQ) 50 MG 24 hr tablet, Take 1 tablet (50 mg total) by mouth daily., Disp: 30 tablet, Rfl: 5 .  Ipratropium-Albuterol (COMBIVENT) 20-100 MCG/ACT AERS respimat, Inhale 1 puff into the lungs every 6 (six) hours., Disp: 3 Inhaler, Rfl: 1 .  SUMAtriptan (IMITREX) 100 MG tablet, Take 1 tablet (100 mg total) by mouth as needed for migraine. May repeat in 2 hours if headache persists or recurs., Disp: 18 tablet, Rfl: 1 .  topiramate (TOPAMAX) 100 MG tablet, Take 1 tablet (100 mg total) by mouth 2 (two) times daily., Disp: 180 tablet, Rfl: 1 .  valACYclovir (VALTREX) 500 MG tablet, Take 1 tablet (500 mg total) by mouth 2 (two) times daily., Disp: 30 tablet, Rfl: 0  Allergies  Allergen Reactions  . Codeine   . Pantoprazole     Flu like syptoms     ROS  Constitutional: Negative for fever or weight change.  Respiratory: Negative for cough , occasionally has  shortness of breath.   Cardiovascular: Negative for chest pain or palpitations.  Gastrointestinal: Negative for abdominal pain, no bowel changes.  Musculoskeletal: Negative for gait problem or joint swelling.  Skin: Negative for  rash.  Neurological: Negative for dizziness , positive for intermittent headache.  No other specific complaints in a complete review of systems (except as listed in HPI above).  Objective  Filed Vitals:   08/20/15 0818  BP: 102/66  Pulse: 88  Temp: 98 F (36.7 C)  TempSrc: Oral  Resp: 14  Height: 5' 2" (1.575 m)  Weight: 148 lb 14.4 oz (67.541 kg)  SpO2: 97%    Body mass index is 27.23 kg/(m^2).  Physical Exam  Constitutional: Patient appears well-developed and well-nourished. No distress.  HEENT: head atraumatic, normocephalic, pupils equal and reactive to light, neck supple, throat within normal limits Cardiovascular: Normal rate, regular  rhythm and normal heart sounds.  No murmur heard. No BLE edema. Pulmonary/Chest: Effort normal and breath sounds normal. No respiratory distress. Abdominal: Soft.  There is no tenderness. Psychiatric: Patient has a normal mood and affect. behavior is normal. Judgment and thought content normal.  PHQ2/9: Depression screen [facility] 2/9 05/22/2015 02/19/2015 11/16/2014  Decreased Interest 0 0 0  Down, Depressed, Hopeless 0 0 0  PHQ - 2 Score 0 0 0     Fall Risk: Fall Risk  08/20/2015 05/22/2015 02/19/2015 11/16/2014 11/16/2014  Falls in the past year? _0      Functional Status Survey: Is the patient deaf or have difficulty hearing?: No Does the patient have difficulty seeing, even when wearing glasses/contacts?: No Does the patient have difficulty concentrating, remembering, or making decisions?: No Does the patient have difficulty walking or climbing stairs?: No Does the patient have difficulty dressing or bathing?: No Does the patient have difficulty doing errands alone such as visiting a doctor's office or shopping?: No    Assessment & Plan  1. Migraine without aura and without status migrainosus, not intractable  - topiramate (TOPAMAX) 100 MG tablet; Take 1 tablet (100 mg total) by mouth 2 (two) times daily.  Dispense:  180 tablet; Refill: 1  2. H/O herpes simplex type 2 infection  - valACYclovir (VALTREX) 500 MG tablet; Take 1 tablet (500 mg total) by mouth 2 (two) times daily.  Dispense: 30 tablet; Refill: 0  3. Major depressive disorder, single episode, in partial remission (HCC)  - desvenlafaxine (PRISTIQ) 50 MG 24 hr tablet; Take 1 tablet (50 mg total) by mouth daily.  Dispense: 30 tablet; Refill: 5  4. ADD (attention deficit hyperactivity disorder, inattentive type)  - amphetamine-dextroamphetamine (ADDERALL) 10 MG tablet; Take 1 tablet (10 mg total) by mouth 3 (three) times daily. 2 in am and one in pm  Dispense: 90 tablet; Refill: 0 - amphetamine-dextroamphetamine (ADDERALL) 10 MG tablet; Take 1 tablet (10 mg total) by mouth 3 (three) times daily. 2 in am and one in pm  Dispense: 90 tablet; Refill: 0 - amphetamine-dextroamphetamine (ADDERALL) 10 MG tablet; Take 1 tablet (10 mg total) by mouth 3 (three) times daily. 2 in am and one in pm  Dispense: 90 tablet; Refill: 0  5. COPD, mild ([place])  Doing well on prn medication   6. B12 deficiency  Continue B12 injection

## 2015-08-31 ENCOUNTER — Other Ambulatory Visit: Payer: Self-pay | Admitting: Family Medicine

## 2015-09-18 ENCOUNTER — Other Ambulatory Visit: Payer: Self-pay | Admitting: Family Medicine

## 2015-09-18 NOTE — Telephone Encounter (Signed)
Patient requesting refill. 

## 2015-10-31 ENCOUNTER — Other Ambulatory Visit: Payer: Self-pay | Admitting: Family Medicine

## 2015-10-31 NOTE — Telephone Encounter (Signed)
Patient requesting refill. 

## 2015-12-04 ENCOUNTER — Other Ambulatory Visit: Payer: Self-pay | Admitting: Family Medicine

## 2015-12-04 NOTE — Telephone Encounter (Signed)
Patient requesting refill. 

## 2015-12-09 ENCOUNTER — Ambulatory Visit (INDEPENDENT_AMBULATORY_CARE_PROVIDER_SITE_OTHER): Payer: 59 | Admitting: Family Medicine

## 2015-12-09 ENCOUNTER — Encounter: Payer: Self-pay | Admitting: Family Medicine

## 2015-12-09 VITALS — BP 108/70 | HR 77 | Temp 97.6°F | Resp 16 | Wt 150.4 lb

## 2015-12-09 DIAGNOSIS — K7689 Other specified diseases of liver: Secondary | ICD-10-CM

## 2015-12-09 DIAGNOSIS — Z79899 Other long term (current) drug therapy: Secondary | ICD-10-CM

## 2015-12-09 DIAGNOSIS — E785 Hyperlipidemia, unspecified: Secondary | ICD-10-CM

## 2015-12-09 DIAGNOSIS — F900 Attention-deficit hyperactivity disorder, predominantly inattentive type: Secondary | ICD-10-CM

## 2015-12-09 DIAGNOSIS — J449 Chronic obstructive pulmonary disease, unspecified: Secondary | ICD-10-CM

## 2015-12-09 DIAGNOSIS — E559 Vitamin D deficiency, unspecified: Secondary | ICD-10-CM | POA: Diagnosis not present

## 2015-12-09 DIAGNOSIS — K219 Gastro-esophageal reflux disease without esophagitis: Secondary | ICD-10-CM | POA: Diagnosis not present

## 2015-12-09 DIAGNOSIS — F324 Major depressive disorder, single episode, in partial remission: Secondary | ICD-10-CM

## 2015-12-09 DIAGNOSIS — G43009 Migraine without aura, not intractable, without status migrainosus: Secondary | ICD-10-CM | POA: Diagnosis not present

## 2015-12-09 DIAGNOSIS — Z131 Encounter for screening for diabetes mellitus: Secondary | ICD-10-CM

## 2015-12-09 DIAGNOSIS — K769 Liver disease, unspecified: Secondary | ICD-10-CM

## 2015-12-09 DIAGNOSIS — E538 Deficiency of other specified B group vitamins: Secondary | ICD-10-CM | POA: Diagnosis not present

## 2015-12-09 LAB — HEMOGLOBIN A1C
HEMOGLOBIN A1C: 5.6 % (ref <5.7)
MEAN PLASMA GLUCOSE: 114 mg/dL

## 2015-12-09 LAB — CBC WITH DIFFERENTIAL/PLATELET
BASOS ABS: 38 {cells}/uL (ref 0–200)
Basophils Relative: 1 %
EOS PCT: 3 %
Eosinophils Absolute: 114 cells/uL (ref 15–500)
HCT: 40 % (ref 35.0–45.0)
Hemoglobin: 13.1 g/dL (ref 11.7–15.5)
LYMPHS ABS: 1140 {cells}/uL (ref 850–3900)
Lymphocytes Relative: 30 %
MCH: 27.7 pg (ref 27.0–33.0)
MCHC: 32.8 g/dL (ref 32.0–36.0)
MCV: 84.6 fL (ref 80.0–100.0)
MONOS PCT: 8 %
MPV: 9.7 fL (ref 7.5–12.5)
Monocytes Absolute: 304 cells/uL (ref 200–950)
NEUTROS ABS: 2204 {cells}/uL (ref 1500–7800)
NEUTROS PCT: 58 %
PLATELETS: 190 10*3/uL (ref 140–400)
RBC: 4.73 MIL/uL (ref 3.80–5.10)
RDW: 15 % (ref 11.0–15.0)
WBC: 3.8 10*3/uL (ref 3.8–10.8)

## 2015-12-09 LAB — COMPLETE METABOLIC PANEL WITH GFR
ALBUMIN: 4.1 g/dL (ref 3.6–5.1)
ALK PHOS: 101 U/L (ref 33–130)
ALT: 9 U/L (ref 6–29)
AST: 11 U/L (ref 10–35)
BILIRUBIN TOTAL: 0.3 mg/dL (ref 0.2–1.2)
BUN: 22 mg/dL (ref 7–25)
CALCIUM: 9.1 mg/dL (ref 8.6–10.4)
CO2: 24 mmol/L (ref 20–31)
Chloride: 109 mmol/L (ref 98–110)
Creat: 1.04 mg/dL (ref 0.50–1.05)
GFR, EST NON AFRICAN AMERICAN: 61 mL/min (ref >=60)
GFR, Est African American: 71 mL/min (ref >=60)
GLUCOSE: 98 mg/dL (ref 65–99)
POTASSIUM: 4.2 mmol/L (ref 3.5–5.3)
SODIUM: 140 mmol/L (ref 135–146)
TOTAL PROTEIN: 6.6 g/dL (ref 6.1–8.1)

## 2015-12-09 LAB — LIPID PANEL
CHOL/HDL RATIO: 4.5 ratio (ref <=5.0)
CHOLESTEROL: 207 mg/dL — AB (ref 125–200)
HDL: 46 mg/dL (ref >=46)
LDL CALC: 138 mg/dL — AB (ref <130)
TRIGLYCERIDES: 113 mg/dL (ref <150)
VLDL: 23 mg/dL (ref <30)

## 2015-12-09 LAB — VITAMIN B12: Vitamin B-12: 1161 pg/mL — ABNORMAL HIGH (ref 200–1100)

## 2015-12-09 MED ORDER — IPRATROPIUM-ALBUTEROL 20-100 MCG/ACT IN AERS: 1.0000 | INHALATION_SPRAY | Freq: Four times a day (QID) | RESPIRATORY_TRACT | Status: DC | PRN

## 2015-12-09 MED ORDER — AMPHETAMINE-DEXTROAMPHETAMINE 10 MG PO TABS: 10.0000 mg | ORAL_TABLET | Freq: Three times a day (TID) | ORAL | Status: DC

## 2015-12-09 MED ORDER — CYANOCOBALAMIN 1000 MCG/ML IJ KIT: 1000.0000 mg | PACK | INTRAMUSCULAR | Status: DC

## 2015-12-09 NOTE — Progress Notes (Signed)
Name: Jane Patrick   MRN: [id number]    DOB: 09-14-1960   Date:12/09/2015       Progress Note  Subjective  Chief Complaint  Chief Complaint  Patient presents with  . Medication Refill  . Migraine  . Depression  . ADD  . COPD  . B12 Deficiency    HPI  Migraine headache: she is on Topamax for prevention, usually triggered by barometric pressure changes. Imitrex helps with symptoms, takes about 45 minutes to resolve the symptoms. Symptoms are described as throbbing, usually temporal but can radiate to the entire head. It is associates with nausea and vomiting, at times associated with phonophobia and photophobia. Last episode was 7/10 and was about one month ago. It came in waves and lasted a couple of days  Abnormal pap smear in 2010/08/24: she was referred to [facility], and advised to see Dr. Merilyn Baba.  She had a TAH in Aug 24, 2002 for treatment of abnormal cervical cells. She was seen by Dr. Leafy Ro at [facility] and last pap done in Feb 2017 was normal with positive HPV and will go back for follow up yearly. No vaginal bleeding or cramping  Herpes Type II: she had a small lesion when she was seen by Dr. Leafy Ro and culture was positive for herpes, she was given Valtrex, no other outbreaks since. Boyfriend has been notified.   Hematuria: seen at [facility] in 2010-08-24 and advised by them to follow up in 6 months, but did not go back, she does not want to go back now. She has a long history of hematuria and in one of the studies she has a liver lesion, but was offered multiple times to have a repeat CT scan but she continues to refuse. She still has not gone back yet. She still wants to see insurance coverage. Can't have an MRI because of metal rod on left femur  ADD: .Working for [employer]. They are located in [place], she works from home. She states medication helps her focus. No side effects of medication . Skipping Adderal on weekends, sometimes she skips during the day  COPD Mild:  occasional SOB , no cough, no wheezing, no decrease in exercise tolerance. No longer smoking. She uses Combivent intermittently - usually for SOB. She has not noticed worsening of symptoms this Summer, but has been spending more time indoors.   B12 deficiency: found in June 2016, she is getting B12 at home, her daughter is a CMA - she states she has not really noticed an improvement in energy with the B12 supplementation   Major Depression in partial Remission: on Pristiq and Alprazolam prn. Symptoms started when husband was diagnosed with lung cancer and died in 08-23-2008.She does not want to change medication or see a Psychiatrist . No crying spells, she states the anhedonia has improved. She also states it helps regulate her mood.    Patient Active Problem List   Diagnosis Date Noted  . H/O herpes simplex type 2 infection 08/20/2015  . Hematuria 02/19/2015  . B12 deficiency 02/19/2015  . ADD (attention deficit hyperactivity disorder, inattentive type) 11/13/2014  . COPD, mild ([place]) 11/13/2014  . Major depression in partial remission ([place]) 11/13/2014  . History of basal cell carcinoma 11/13/2014  . Gastroesophageal reflux disease without esophagitis 11/13/2014  . History of abnormal cervical Pap smear 11/13/2014  . Migraine without aura and without status migrainosus, not intractable 11/13/2014  . Vitamin D deficiency 11/13/2014  . Liver lesion 10/04/2010    Past  Surgical History  Procedure Laterality Date  . Abdominal hysterectomy  2004    partial  . Fracture surgery      rode in femur  . Breast surgery      biopsies  . Polypectomy      Family History  Problem Relation Age of Onset  . Colon cancer Father   . Breast cancer Sister     Social History   Social History  . Marital Status: Married    Spouse Name: N/A  . Number of Children: N/A  . Years of Education: N/A   Occupational History  . Not on file.   Social History Main Topics  . Smoking status: Former Smoker --  1.00 packs/day for 25 years    Types: Cigarettes    Quit date: 05/25/2004  . Smokeless tobacco: Never Used  . Alcohol Use: No  . Drug Use: No  . Sexual Activity: Yes   Other Topics Concern  . Not on file   Social History Narrative     Current outpatient prescriptions:  .  ALPRAZolam (XANAX) 0.5 MG tablet, TAKE 1 TABLET BY MOUTH AS NEEDED FOR ANXIETY, Disp: 30 tablet, Rfl: 0 .  amphetamine-dextroamphetamine (ADDERALL) 10 MG tablet, Take 1 tablet (10 mg total) by mouth 3 (three) times daily. 2 in am and one in pm, Disp: 90 tablet, Rfl: 0 .  amphetamine-dextroamphetamine (ADDERALL) 10 MG tablet, Take 1 tablet (10 mg total) by mouth 3 (three) times daily. 2 in am and one in pm, Disp: 90 tablet, Rfl: 0 .  amphetamine-dextroamphetamine (ADDERALL) 10 MG tablet, Take 1 tablet (10 mg total) by mouth 3 (three) times daily. 2 in am and one in pm, Disp: 90 tablet, Rfl: 0 .  cholecalciferol (VITAMIN D) 1000 UNITS tablet, Take 2,000 Units by mouth daily., Disp: , Rfl:  .  Cyanocobalamin 1000 MCG/ML KIT, Inject 1,000 mg as directed every 30 (thirty) days., Disp: 1 kit, Rfl: 5 .  desvenlafaxine (PRISTIQ) 50 MG 24 hr tablet, Take 1 tablet (50 mg total) by mouth daily., Disp: 30 tablet, Rfl: 5 .  Ipratropium-Albuterol (COMBIVENT RESPIMAT) 20-100 MCG/ACT AERS respimat, Inhale 1 puff into the lungs every 6 (six) hours as needed for wheezing or shortness of breath., Disp: 12 g, Rfl: 2 .  SUMAtriptan (IMITREX) 100 MG tablet, Take 1 tablet (100 mg total) by mouth as needed for migraine. May repeat in 2 hours if headache persists or recurs., Disp: 18 tablet, Rfl: 1 .  topiramate (TOPAMAX) 100 MG tablet, Take 1 tablet by mouth two  times daily, Disp: 180 tablet, Rfl: 1 .  valACYclovir (VALTREX) 500 MG tablet, Take 1 tablet (500 mg total) by mouth 2 (two) times daily., Disp: 30 tablet, Rfl: 0  Allergies  Allergen Reactions  . Codeine   . Pantoprazole     Flu like syptoms     ROS  Constitutional: Negative  for fever or weight change.  Respiratory: Negative for cough , positive for intermittent  shortness of breath.   Cardiovascular: Negative for chest pain or palpitations.  Gastrointestinal: Negative for abdominal pain, no bowel changes.  Musculoskeletal: Negative for gait problem or joint swelling.  Skin: Negative for rash.  Neurological: Negative for dizziness or headache.  No other specific complaints in a complete review of systems (except as listed in HPI above).  Objective  Filed Vitals:   12/09/15 0819  BP: 108/70  Pulse: 77  Temp: 97.6 F (36.4 C)  TempSrc: Oral  Resp: 16  Weight: 150 lb  6.4 oz (68.221 kg)  SpO2: 98%    Body mass index is 27.5 kg/(m^2).  Physical Exam  Constitutional: Patient appears well-developed and well-nourished. No distress.  HEENT: head atraumatic, normocephalic, pupils equal and reactive to light,  neck supple, throat within normal limits Cardiovascular: Normal rate, regular rhythm and normal heart sounds.  No murmur heard. No BLE edema. Pulmonary/Chest: Effort normal and breath sounds normal. No respiratory distress. Abdominal: Soft.  There is no tenderness. Psychiatric: Patient has a normal mood and affect. behavior is normal. Judgment and thought content normal.  PHQ2/9: Depression screen [facility] 2/9 12/09/2015 05/22/2015 02/19/2015 11/16/2014  Decreased Interest 0 0 0 0  Down, Depressed, Hopeless 0 0 0 0  PHQ - 2 Score 0 0 0 0     Fall Risk: Fall Risk  12/09/2015 08/20/2015 05/22/2015 02/19/2015 11/16/2014  Falls in the past year? No No No No No     Functional Status Survey: Is the patient deaf or have difficulty hearing?: No Does the patient have difficulty seeing, even when wearing glasses/contacts?: No Does the patient have difficulty concentrating, remembering, or making decisions?: No Does the patient have difficulty walking or climbing stairs?: No Does the patient have difficulty dressing or bathing?: No Does the patient have  difficulty doing errands alone such as visiting a doctor's office or shopping?: No    Assessment & Plan  1. Migraine without aura and without status migrainosus, not intractable  Doing well on preventive medication   2. Major depressive disorder, single episode, in partial remission ([place])  Doing well with medication   3. ADD (attention deficit hyperactivity disorder, inattentive type)  - amphetamine-dextroamphetamine (ADDERALL) 10 MG tablet; Take 1 tablet (10 mg total) by mouth 3 (three) times daily. 2 in am and one in pm  Dispense: 90 tablet; Refill: 0 - amphetamine-dextroamphetamine (ADDERALL) 10 MG tablet; Take 1 tablet (10 mg total) by mouth 3 (three) times daily. 2 in am and one in pm  Dispense: 90 tablet; Refill: 0 - amphetamine-dextroamphetamine (ADDERALL) 10 MG tablet; Take 1 tablet (10 mg total) by mouth 3 (three) times daily. 2 in am and one in pm  Dispense: 90 tablet; Refill: 0  4. COPD, mild ([place])  - Ipratropium-Albuterol (COMBIVENT RESPIMAT) 20-100 MCG/ACT AERS respimat; Inhale 1 puff into the lungs every 6 (six) hours as needed for wheezing or shortness of breath.  Dispense: 12 g; Refill: 2  5. B12 deficiency  - Cyanocobalamin 1000 MCG/ML KIT; Inject 1,000 mg as directed every 30 (thirty) days.  Dispense: 1 kit; Refill: 5 - Vitamin B12  6. Vitamin D deficiency  - VITAMIN D 25 Hydroxy (Vit-D Deficiency, Fractures)  7. Gastroesophageal reflux disease without esophagitis  Not symptoms at this time  8. Dyslipidemia  - Lipid panel  9. Long-term use of high-risk medication  - COMPLETE METABOLIC PANEL WITH GFR - CBC with Differential/Platelet  10. Screening for diabetes mellitus (DM)  - Hemoglobin A1c  11. Liver lesion  She does not want to have test done at this time for follow up

## 2015-12-10 LAB — VITAMIN D 25 HYDROXY (VIT D DEFICIENCY, FRACTURES): Vit D, 25-Hydroxy: 32 ng/mL (ref 30–100)

## 2016-01-15 ENCOUNTER — Encounter: Payer: Self-pay | Admitting: Family Medicine

## 2016-01-15 ENCOUNTER — Ambulatory Visit (INDEPENDENT_AMBULATORY_CARE_PROVIDER_SITE_OTHER): Payer: 59 | Admitting: Family Medicine

## 2016-01-15 VITALS — BP 118/68 | HR 88 | Temp 97.9°F | Resp 20 | Ht 62.0 in | Wt 149.0 lb

## 2016-01-15 DIAGNOSIS — M7541 Impingement syndrome of right shoulder: Secondary | ICD-10-CM | POA: Diagnosis not present

## 2016-01-15 DIAGNOSIS — M25511 Pain in right shoulder: Secondary | ICD-10-CM

## 2016-01-15 MED ORDER — MELOXICAM 15 MG PO TABS: 15.0000 mg | ORAL_TABLET | Freq: Every day | ORAL | 0 refills | Status: DC

## 2016-01-15 MED ORDER — HYDROCODONE-ACETAMINOPHEN 10-325 MG PO TABS: 1.0000 | ORAL_TABLET | Freq: Four times a day (QID) | ORAL | 0 refills | Status: DC | PRN

## 2016-01-15 NOTE — Progress Notes (Signed)
Name: Jane Patrick   MRN: [id number]    DOB: [birth year]   Date:01/15/2016       Progress Note  Subjective  Chief Complaint  Chief Complaint  Patient presents with  . Shoulder Pain    rt side for months getting worse    HPI  Right Shoulder pain: gradual onset over the past few months, but is getting progressively worse. Pain is aching or throbbing  like, worse with movement and when she tries to sleep at night. She can't get comfortable, also unable to fasten her bra on her back, able to comb her hair. No trauma, taking ibuprofen every night without much help. No tingling, numbness or weakness or right hand or arm. No neck problems. No previous history of shoulder problems.   Patient Active Problem List   Diagnosis Date Noted  . H/O herpes simplex type 2 infection 08/20/2015  . Hematuria 02/19/2015  . B12 deficiency 02/19/2015  . ADD (attention deficit hyperactivity disorder, inattentive type) 11/13/2014  . COPD, mild ([place]) 11/13/2014  . Major depression in partial remission ([place]) 11/13/2014  . History of basal cell carcinoma 11/13/2014  . Gastroesophageal reflux disease without esophagitis 11/13/2014  . History of abnormal cervical Pap smear 11/13/2014  . Migraine without aura and without status migrainosus, not intractable 11/13/2014  . Vitamin D deficiency 11/13/2014  . Liver lesion 10/04/2010    Past Surgical History:  Procedure Laterality Date  . ABDOMINAL HYSTERECTOMY  2004   partial  . BREAST SURGERY     biopsies  . FRACTURE SURGERY     rode in femur  . POLYPECTOMY      Family History  Problem Relation Age of Onset  . Colon cancer Father   . Breast cancer Sister     Social History   Social History  . Marital status: Married    Spouse name: N/A  . Number of children: N/A  . Years of education: N/A   Occupational History  . Not on file.   Social History Main Topics  . Smoking status: Former Smoker    Packs/day: 1.00    Years: 25.00    Types:  Cigarettes    Quit date: 05/25/2004  . Smokeless tobacco: Never Used  . Alcohol use No  . Drug use: No  . Sexual activity: Yes   Other Topics Concern  . Not on file   Social History Narrative  . No narrative on file     Current Outpatient Prescriptions:  .  ALPRAZolam (XANAX) 0.5 MG tablet, TAKE 1 TABLET BY MOUTH AS NEEDED FOR ANXIETY, Disp: 30 tablet, Rfl: 0 .  amphetamine-dextroamphetamine (ADDERALL) 10 MG tablet, Take 1 tablet (10 mg total) by mouth 3 (three) times daily. 2 in am and one in pm, Disp: 90 tablet, Rfl: 0 .  amphetamine-dextroamphetamine (ADDERALL) 10 MG tablet, Take 1 tablet (10 mg total) by mouth 3 (three) times daily. 2 in am and one in pm, Disp: 90 tablet, Rfl: 0 .  amphetamine-dextroamphetamine (ADDERALL) 10 MG tablet, Take 1 tablet (10 mg total) by mouth 3 (three) times daily. 2 in am and one in pm, Disp: 90 tablet, Rfl: 0 .  cholecalciferol (VITAMIN D) 1000 UNITS tablet, Take 2,000 Units by mouth daily., Disp: , Rfl:  .  Cyanocobalamin 1000 MCG/ML KIT, Inject 1,000 mg as directed every 30 (thirty) days., Disp: 1 kit, Rfl: 5 .  desvenlafaxine (PRISTIQ) 50 MG 24 hr tablet, Take 1 tablet (50 mg total) by mouth daily., Disp: 30 tablet,  Rfl: 5 .  Ipratropium-Albuterol (COMBIVENT RESPIMAT) 20-100 MCG/ACT AERS respimat, Inhale 1 puff into the lungs every 6 (six) hours as needed for wheezing or shortness of breath., Disp: 12 g, Rfl: 2 .  meloxicam (MOBIC) 15 MG tablet, Take 1 tablet (15 mg total) by mouth daily., Disp: 30 tablet, Rfl: 0 .  SUMAtriptan (IMITREX) 100 MG tablet, Take 1 tablet (100 mg total) by mouth as needed for migraine. May repeat in 2 hours if headache persists or recurs., Disp: 18 tablet, Rfl: 1 .  topiramate (TOPAMAX) 100 MG tablet, Take 1 tablet by mouth two  times daily, Disp: 180 tablet, Rfl: 1 .  valACYclovir (VALTREX) 500 MG tablet, Take 1 tablet (500 mg total) by mouth 2 (two) times daily., Disp: 30 tablet, Rfl: 0  Allergies  Allergen Reactions  .  Codeine   . Pantoprazole     Flu like syptoms     ROS  Ten systems reviewed and is negative except as mentioned in HPI   Objective  Vitals:   01/15/16 1344  BP: 118/68  Pulse: 88  Resp: 20  Temp: 97.9 F (36.6 C)  SpO2: 95%  Weight: 149 lb (67.6 kg)  Height: '5\' 2"'  (1.575 m)    Body mass index is 27.25 kg/m.  Physical Exam  Constitutional: Patient appears well-developed and well-nourished. Obese  No distress.  HEENT: head atraumatic, normocephalic, pupils equal and reactive to light,  neck supple, throat within normal limits Cardiovascular: Normal rate, regular rhythm and normal heart sounds.  No murmur heard. No BLE edema. Pulmonary/Chest: Effort normal and breath sounds normal. No respiratory distress. Abdominal: Soft.  There is no tenderness. Psychiatric: Patient has a normal mood and affect. behavior is normal. Judgment and thought content normal. Muscular Skeletal: she has pain with abduction, internal rotation and positive empty can sign on right side, also pain during palpation of right anterior shoulder  Recent Results (from the past 2160 hour(s))  Lipid panel     Status: Abnormal   Collection Time: 12/09/15  9:04 AM  Result Value Ref Range   Cholesterol 207 (H) 125 - 200 mg/dL   Triglycerides 113 <150 mg/dL   HDL 46 >=46 mg/dL   Total CHOL/HDL Ratio 4.5 <=5.0 Ratio   VLDL 23 <30 mg/dL   LDL Cholesterol 138 (H) <130 mg/dL    Comment:   Total Cholesterol/HDL Ratio:CHD Risk                        Coronary Heart Disease Risk Table                                        Men       Women          1/2 Average Risk              3.4        3.3              Average Risk              5.0        4.4           2X Average Risk              9.6        7.1           3X  Average Risk             23.4       11.0 Use the calculated Patient Ratio above and the CHD Risk table  to determine the patient's CHD Risk.   VITAMIN D 25 Hydroxy (Vit-D Deficiency, Fractures)      Status: None   Collection Time: 12/09/15  9:04 AM  Result Value Ref Range   Vit D, 25-Hydroxy 32 30 - 100 ng/mL    Comment: Vitamin D Status           25-OH Vitamin D        Deficiency                <20 ng/mL        Insufficiency         20 - 29 ng/mL        Optimal             > or = 30 ng/mL   For 25-OH Vitamin D testing on patients on D2-supplementation and patients for whom quantitation of D2 and D3 fractions is required, the QuestAssureD 25-OH VIT D, (D2,D3), LC/MS/MS is recommended: order code [id number] (patients > 2 yrs).   Vitamin B12     Status: Abnormal   Collection Time: 12/09/15  9:04 AM  Result Value Ref Range   Vitamin B-12 1,161 (H) 200 - 1,100 pg/mL  COMPLETE METABOLIC PANEL WITH GFR     Status: None   Collection Time: 12/09/15  9:04 AM  Result Value Ref Range   Sodium 140 135 - 146 mmol/L   Potassium 4.2 3.5 - 5.3 mmol/L   Chloride 109 98 - 110 mmol/L   CO2 24 20 - 31 mmol/L   Glucose, Bld 98 65 - 99 mg/dL   BUN 22 7 - 25 mg/dL   Creat 1.04 0.50 - 1.05 mg/dL    Comment:   For patients > or = 54 years of age: The upper reference limit for Creatinine is approximately 13% higher for people identified as African-American.      Total Bilirubin 0.3 0.2 - 1.2 mg/dL   Alkaline Phosphatase 101 33 - 130 U/L   AST 11 10 - 35 U/L   ALT 9 6 - 29 U/L   Total Protein 6.6 6.1 - 8.1 g/dL   Albumin 4.1 3.6 - 5.1 g/dL   Calcium 9.1 8.6 - 10.4 mg/dL   GFR, Est African American 71 >=60 mL/min   GFR, Est Non African American 61 >=60 mL/min  CBC with Differential/Platelet     Status: None   Collection Time: 12/09/15  9:04 AM  Result Value Ref Range   WBC 3.8 3.8 - 10.8 K/uL   RBC 4.73 3.80 - 5.10 MIL/uL   Hemoglobin 13.1 11.7 - 15.5 g/dL   HCT 40.0 35.0 - 45.0 %   MCV 84.6 80.0 - 100.0 fL   MCH 27.7 27.0 - 33.0 pg   MCHC 32.8 32.0 - 36.0 g/dL   RDW 15.0 11.0 - 15.0 %   Platelets 190 140 - 400 K/uL   MPV 9.7 7.5 - 12.5 fL   Neutro Abs 2,204 1,500 - 7,800 cells/uL    Lymphs Abs 1,140 850 - 3,900 cells/uL   Monocytes Absolute 304 200 - 950 cells/uL   Eosinophils Absolute 114 15 - 500 cells/uL   Basophils Absolute 38 0 - 200 cells/uL   Neutrophils Relative % 58 %   Lymphocytes Relative 30 %   Monocytes Relative  8 %   Eosinophils Relative 3 %   Basophils Relative 1 %   Smear Review Criteria for review not met     Comment: ** Please note change in unit of measure and reference range(s). **  Hemoglobin A1c     Status: None   Collection Time: 12/09/15  9:04 AM  Result Value Ref Range   Hgb A1c MFr Bld 5.6 <5.7 %    Comment:   For the purpose of screening for the presence of diabetes:   <5.7%       Consistent with the absence of diabetes 5.7-6.4 %   Consistent with increased risk for diabetes (prediabetes) >=6.5 %     Consistent with diabetes   This assay result is consistent with a decreased risk of diabetes.   Currently, no consensus exists regarding use of hemoglobin A1c for diagnosis of diabetes in children.   According to American Diabetes Association (ADA) guidelines, hemoglobin A1c <7.0% represents optimal control in non-pregnant diabetic patients. Different metrics may apply to specific patient populations. Standards of Medical Care in Diabetes (ADA).      Mean Plasma Glucose 114 mg/dL     PHQ2/9: Depression screen [facility] 2/9 01/15/2016 12/09/2015 05/22/2015 02/19/2015 11/16/2014  Decreased Interest 0 0 0 0 0  Down, Depressed, Hopeless 0 0 0 0 0  PHQ - 2 Score 0 0 0 0 0     Fall Risk: Fall Risk  01/15/2016 12/09/2015 08/20/2015 05/22/2015 02/19/2015  Falls in the past year? No No No No No     Functional Status Survey: Is the patient deaf or have difficulty hearing?: No Does the patient have difficulty seeing, even when wearing glasses/contacts?: No Does the patient have difficulty concentrating, remembering, or making decisions?: No Does the patient have difficulty walking or climbing stairs?: No Does the patient have difficulty  dressing or bathing?: No Does the patient have difficulty doing errands alone such as visiting a doctor's office or shopping?: No   Assessment & Plan  1. Right shoulder pain  - meloxicam (MOBIC) 15 MG tablet; Take 1 tablet (15 mg total) by mouth daily.  Dispense: 30 tablet; Refill: 0  2. Impingement syndrome of right shoulder  She was seen at [facility] for carpal tunnel in the past. She would like to hold off on steroid injection. We will try Meloxicam and discussed some exercises at home , she will call back for referral to Ortho if no improvement - meloxicam (MOBIC) 15 MG tablet; Take 1 tablet (15 mg total) by mouth daily.  Dispense: 30 tablet; Refill: 0 - HYDROcodone-acetaminophen (NORCO) 10-325 MG tablet; Take 1 tablet by mouth every 6 (six) hours as needed.  Dispense: 20 tablet; Refill: 0

## 2016-03-05 ENCOUNTER — Other Ambulatory Visit: Payer: Self-pay | Admitting: Family Medicine

## 2016-03-05 DIAGNOSIS — F324 Major depressive disorder, single episode, in partial remission: Secondary | ICD-10-CM

## 2016-03-05 NOTE — Telephone Encounter (Signed)
Patient requesting refill of Pristiq to [facility].  

## 2016-03-09 ENCOUNTER — Ambulatory Visit (INDEPENDENT_AMBULATORY_CARE_PROVIDER_SITE_OTHER): Payer: 59 | Admitting: Family Medicine

## 2016-03-09 ENCOUNTER — Encounter: Payer: Self-pay | Admitting: Family Medicine

## 2016-03-09 VITALS — BP 112/64 | HR 78 | Temp 98.0°F | Resp 18 | Ht 62.0 in | Wt 148.0 lb

## 2016-03-09 DIAGNOSIS — E559 Vitamin D deficiency, unspecified: Secondary | ICD-10-CM

## 2016-03-09 DIAGNOSIS — K219 Gastro-esophageal reflux disease without esophagitis: Secondary | ICD-10-CM | POA: Diagnosis not present

## 2016-03-09 DIAGNOSIS — G43009 Migraine without aura, not intractable, without status migrainosus: Secondary | ICD-10-CM

## 2016-03-09 DIAGNOSIS — E538 Deficiency of other specified B group vitamins: Secondary | ICD-10-CM

## 2016-03-09 DIAGNOSIS — E785 Hyperlipidemia, unspecified: Secondary | ICD-10-CM | POA: Diagnosis not present

## 2016-03-09 DIAGNOSIS — F900 Attention-deficit hyperactivity disorder, predominantly inattentive type: Secondary | ICD-10-CM

## 2016-03-09 DIAGNOSIS — M7541 Impingement syndrome of right shoulder: Secondary | ICD-10-CM | POA: Diagnosis not present

## 2016-03-09 DIAGNOSIS — F324 Major depressive disorder, single episode, in partial remission: Secondary | ICD-10-CM

## 2016-03-09 DIAGNOSIS — J449 Chronic obstructive pulmonary disease, unspecified: Secondary | ICD-10-CM | POA: Diagnosis not present

## 2016-03-09 MED ORDER — RANITIDINE HCL 300 MG PO TABS: 300.0000 mg | ORAL_TABLET | Freq: Every day | ORAL | 2 refills | Status: DC

## 2016-03-09 MED ORDER — TOPIRAMATE 100 MG PO TABS: 100.0000 mg | ORAL_TABLET | Freq: Two times a day (BID) | ORAL | 1 refills | Status: DC

## 2016-03-09 MED ORDER — SUMATRIPTAN SUCCINATE 100 MG PO TABS: 100.0000 mg | ORAL_TABLET | ORAL | 1 refills | Status: DC | PRN

## 2016-03-09 MED ORDER — AMPHETAMINE-DEXTROAMPHETAMINE 10 MG PO TABS: 10.0000 mg | ORAL_TABLET | Freq: Three times a day (TID) | ORAL | 0 refills | Status: DC

## 2016-03-09 MED ORDER — DESVENLAFAXINE SUCCINATE ER 50 MG PO TB24: 50.0000 mg | ORAL_TABLET | Freq: Every day | ORAL | 2 refills | Status: DC

## 2016-03-09 NOTE — Progress Notes (Signed)
Name: Jane Patrick   MRN: [id number]    DOB: 12-Nov-1960   Date:03/09/2016       Progress Note  Subjective  Chief Complaint  Chief Complaint  Patient presents with  . Medication Refill    3 month F/U  . Migraine    Takes medication as needed, has had 2 since last visit. Patient states lack of sleep will bring them on and change of weather.  . ADD    Well controlled  . COPD    Mild, well controlled    HPI   Migraine headache: she is on Topamax for prevention, usually triggered by barometric pressure changes. Imitrex helps with symptoms, takes about 45 minutes to resolve the symptoms and she feels groggy the following day. Symptoms are described as throbbing, usually temporal but can radiate to the entire head. It is associates with nausea and vomiting, at times associated with phonophobia and photophobia. Last episode was yesterday. Total of two episodes since last visit.  Abnormal pap smear in 10-19-2010: she was referred to [facility], and advised to see [name].  She had a TAH in 2002-10-19 for treatment of abnormal cervical cells. She was seen by [name] at [facility] and last pap done in Feb 2017 was normal with positive HPV and will go back for follow up yearly. No vaginal bleeding or cramping  Herpes Type II: she had a small lesion when she was seen by [name] and culture was positive for herpes, she was given Valtrex, no other outbreaks since, she is not on preventive therapy.   Hematuria: seen at [facility] in 19-Oct-2010 and advised by them to follow up in 6 months, but did not go back, she does not want to go back now. She has a long history of hematuria and in one of the studies she has a liver lesion, but was offered multiple times to have a repeat CT scan but she continues to refuse. She still has not gone back yet. She checked the coverage with her insurance but it would be too expensive. Can't have an MRI because of metal rod on left femur.  ADD: .Working for [employer]. They are located in [place], she works from home. She states medication helps her focus. No side effects of medication . Skipping Adderal on weekends, but not able to focus if she skips during the work week.   COPD Mild: occasional SOB , no cough, no wheezing, no decrease in exercise tolerance. No longer smoking. She uses Combivent intermittently - usually for SOB. Symptoms are worse with high humidity , but doing better now, she refuses to have flu shot  B12 deficiency: found in June 2016, she is getting B12 at home, her daughter is a CMA - she states she has not really noticed an improvement in energy with the B12 supplementation   Major Depression in partial Remission: on Pristiq and Alprazolam prn. Symptoms started when husband was diagnosed with lung cancer and died in 10/18/08.She does not want to change medication or see a Psychiatrist. No crying spells, she states the anhedonia has improved. She also states it helps regulate her mood.   GERD: she states symptoms are getting worse, more heartburn and regurgitation, episodes of nausea. She states she can't tolerate PPI and H2 blocker does not work, she has been taking Tums prn .Discussed life style modification, she is willing to try rx Ranitidine  Patient Active Problem List   Diagnosis Date Noted  . H/O  herpes simplex type 2 infection 08/20/2015  . Hematuria 02/19/2015  . B12 deficiency 02/19/2015  . ADD (attention deficit hyperactivity disorder, inattentive type) 11/13/2014  . COPD, mild (HCC) 11/13/2014  . Major depression in partial remission (HCC) 11/13/2014  . History of basal cell carcinoma 11/13/2014  . Gastroesophageal reflux disease without esophagitis 11/13/2014  . History of abnormal cervical Pap smear 11/13/2014  . Migraine without aura and without status migrainosus, not intractable 11/13/2014  . Vitamin D deficiency 11/13/2014  . Liver lesion 10/04/2010    Past Surgical History:  Procedure Laterality Date   . ABDOMINAL HYSTERECTOMY  2004   partial  . BREAST SURGERY     biopsies  . FRACTURE SURGERY     rode in femur  . POLYPECTOMY      Family History  Problem Relation Age of Onset  . Colon cancer Father   . Breast cancer Sister     Social History   Social History  . Marital status: Married    Spouse name: N/A  . Number of children: N/A  . Years of education: N/A   Occupational History  . Not on file.   Social History Main Topics  . Smoking status: Former Smoker    Packs/day: 1.00    Years: 25.00    Types: Cigarettes    Quit date: 05/25/2004  . Smokeless tobacco: Never Used  . Alcohol use No  . Drug use: No  . Sexual activity: Yes   Other Topics Concern  . Not on file   Social History Narrative  . No narrative on file     Current Outpatient Prescriptions:  .  ALPRAZolam (XANAX) 0.5 MG tablet, TAKE 1 TABLET BY MOUTH AS NEEDED FOR ANXIETY, Disp: 30 tablet, Rfl: 0 .  amphetamine-dextroamphetamine (ADDERALL) 10 MG tablet, Take 1 tablet (10 mg total) by mouth 3 (three) times daily. 2 in am and one in pm, Disp: 90 tablet, Rfl: 0 .  amphetamine-dextroamphetamine (ADDERALL) 10 MG tablet, Take 1 tablet (10 mg total) by mouth 3 (three) times daily. 2 in am and one in pm, Disp: 90 tablet, Rfl: 0 .  amphetamine-dextroamphetamine (ADDERALL) 10 MG tablet, Take 1 tablet (10 mg total) by mouth 3 (three) times daily. 2 in am and one in pm, Disp: 90 tablet, Rfl: 0 .  Aspirin-Acetaminophen-Caffeine (GOODY HEADACHE PO), Take by mouth., Disp: , Rfl:  .  cholecalciferol (VITAMIN D) 1000 UNITS tablet, Take 2,000 Units by mouth daily., Disp: , Rfl:  .  cyanocobalamin (,VITAMIN B-12,) 1000 MCG/ML injection, Once a month, Disp: , Rfl:  .  desvenlafaxine (PRISTIQ) 50 MG 24 hr tablet, Take 1 tablet (50 mg total) by mouth daily., Disp: 30 tablet, Rfl: 2 .  ibuprofen (ADVIL,MOTRIN) 200 MG tablet, Take by mouth., Disp: , Rfl:  .  Ipratropium-Albuterol (COMBIVENT RESPIMAT) 20-100 MCG/ACT AERS  respimat, Inhale 1 puff into the lungs every 6 (six) hours as needed for wheezing or shortness of breath., Disp: 12 g, Rfl: 2 .  SUMAtriptan (IMITREX) 100 MG tablet, Take 1 tablet (100 mg total) by mouth as needed for migraine. May repeat in 2 hours if headache persists or recurs., Disp: 18 tablet, Rfl: 1 .  topiramate (TOPAMAX) 100 MG tablet, Take 1 tablet (100 mg total) by mouth 2 (two) times daily., Disp: 180 tablet, Rfl: 1  Allergies  Allergen Reactions  . Codeine   . Pantoprazole     Flu like syptoms     ROS  Constitutional: Negative for fever or weight change.  Respiratory: Negative for cough and shortness of breath.   Cardiovascular: Negative for chest pain or palpitations.  Gastrointestinal: Negative for abdominal pain, no bowel changes.  Musculoskeletal: Negative for gait problem or joint swelling.  Skin: Negative for rash.  Neurological: Negative for dizziness , positive for intermittent headache.  No other specific complaints in a complete review of systems (except as listed in HPI above).  Objective  Vitals:   03/09/16 0814  BP: 112/64  Pulse: 78  Resp: 18  Temp: 98 F (36.7 C)  TempSrc: Oral  SpO2: 97%  Weight: 148 lb (67.1 kg)  Height: 5\' 2"  (1.575 m)    Body mass index is 27.07 kg/m.  Physical Exam  Constitutional: Patient appears well-developed and well-nourished. Obese No distress.  HEENT: head atraumatic, normocephalic, pupils equal and reactive to light,  neck supple, throat within normal limits Cardiovascular: Normal rate, regular rhythm and normal heart sounds.  No murmur heard. No BLE edema. Pulmonary/Chest: Effort normal and breath sounds normal. No respiratory distress. Abdominal: Soft.  There is no tenderness. Psychiatric: Patient has a normal mood and affect. behavior is normal. Judgment and thought content normal.  PHQ2/9: Depression screen [facility] 2/9 01/15/2016 12/09/2015 05/22/2015 02/19/2015 11/16/2014  Decreased Interest 0 0 0 0 0  Down,  Depressed, Hopeless 0 0 0 0 0  PHQ - 2 Score 0 0 0 0 0     Fall Risk: Fall Risk  01/15/2016 12/09/2015 08/20/2015 05/22/2015 02/19/2015  Falls in the past year? No No No No No    Assessment & Plan   1. Major depressive disorder, single episode, in partial remission (HCC)  - desvenlafaxine (PRISTIQ) 50 MG 24 hr tablet; Take 1 tablet (50 mg total) by mouth daily.  Dispense: 30 tablet; Refill: 2  2. Dyslipidemia  ASCVD 1.7% and does not needs statin therapy   3. COPD, mild (HCC)  Continue prn medication   4. Migraine without aura and without status migrainosus, not intractable  - topiramate (TOPAMAX) 100 MG tablet; Take 1 tablet (100 mg total) by mouth 2 (two) times daily.  Dispense: 180 tablet; Refill: 1 - SUMAtriptan (IMITREX) 100 MG tablet; Take 1 tablet (100 mg total) by mouth as needed for migraine. May repeat in 2 hours if headache persists or recurs.  Dispense: 18 tablet; Refill: 1  5. Gastroesophageal reflux disease without esophagitis  Taking Tums prn , we will add Ranitidine 300 mg twice daily   6. B12 deficiency  Continue B12 supplementation   7. Vitamin D deficiency  Continue supplementation   8. Attention deficit hyperactivity disorder (ADHD), predominantly inattentive type  - amphetamine-dextroamphetamine (ADDERALL) 10 MG tablet; Take 1 tablet (10 mg total) by mouth 3 (three) times daily. 2 in am and one in pm  Dispense: 90 tablet; Refill: 0 - amphetamine-dextroamphetamine (ADDERALL) 10 MG tablet; Take 1 tablet (10 mg total) by mouth 3 (three) times daily. 2 in am and one in pm  Dispense: 90 tablet; Refill: 0 - amphetamine-dextroamphetamine (ADDERALL) 10 MG tablet; Take 1 tablet (10 mg total) by mouth 3 (three) times daily. 2 in am and one in pm  Dispense: 90 tablet; Refill: 0  9. Impingement syndrome of right shoulder  He was seen at [facility], and had steroid injection for biceps tendinitis doing much better now

## 2016-04-05 ENCOUNTER — Other Ambulatory Visit: Payer: Self-pay | Admitting: Family Medicine

## 2016-04-05 DIAGNOSIS — F324 Major depressive disorder, single episode, in partial remission: Secondary | ICD-10-CM

## 2016-05-19 ENCOUNTER — Other Ambulatory Visit: Payer: Self-pay | Admitting: Family Medicine

## 2016-05-19 DIAGNOSIS — G43009 Migraine without aura, not intractable, without status migrainosus: Secondary | ICD-10-CM

## 2016-05-20 NOTE — Telephone Encounter (Signed)
Patient requesting refill of Topamax to [facility].

## 2016-06-15 ENCOUNTER — Encounter: Payer: Self-pay | Admitting: Family Medicine

## 2016-06-15 ENCOUNTER — Ambulatory Visit (INDEPENDENT_AMBULATORY_CARE_PROVIDER_SITE_OTHER): Payer: 59 | Admitting: Family Medicine

## 2016-06-15 VITALS — BP 114/66 | HR 91 | Temp 97.9°F | Resp 17 | Ht 62.0 in | Wt 152.2 lb

## 2016-06-15 DIAGNOSIS — E785 Hyperlipidemia, unspecified: Secondary | ICD-10-CM

## 2016-06-15 DIAGNOSIS — Z23 Encounter for immunization: Secondary | ICD-10-CM | POA: Diagnosis not present

## 2016-06-15 DIAGNOSIS — F324 Major depressive disorder, single episode, in partial remission: Secondary | ICD-10-CM | POA: Diagnosis not present

## 2016-06-15 DIAGNOSIS — J449 Chronic obstructive pulmonary disease, unspecified: Secondary | ICD-10-CM | POA: Diagnosis not present

## 2016-06-15 DIAGNOSIS — E538 Deficiency of other specified B group vitamins: Secondary | ICD-10-CM

## 2016-06-15 DIAGNOSIS — G43009 Migraine without aura, not intractable, without status migrainosus: Secondary | ICD-10-CM

## 2016-06-15 DIAGNOSIS — Z87898 Personal history of other specified conditions: Secondary | ICD-10-CM | POA: Diagnosis not present

## 2016-06-15 DIAGNOSIS — F900 Attention-deficit hyperactivity disorder, predominantly inattentive type: Secondary | ICD-10-CM

## 2016-06-15 MED ORDER — AMPHETAMINE-DEXTROAMPHETAMINE 10 MG PO TABS: 10.0000 mg | ORAL_TABLET | Freq: Three times a day (TID) | ORAL | 0 refills | Status: DC

## 2016-06-15 MED ORDER — SCOPOLAMINE 1 MG/3DAYS TD PT72: 1.0000 | MEDICATED_PATCH | TRANSDERMAL | 0 refills | Status: DC

## 2016-06-15 MED ORDER — DESVENLAFAXINE SUCCINATE ER 50 MG PO TB24: 50.0000 mg | ORAL_TABLET | Freq: Every day | ORAL | 5 refills | Status: DC

## 2016-06-15 NOTE — Progress Notes (Signed)
Name: Jane Patrick   MRN: [id number]    DOB: 03/21/1961   Date:06/15/2016       Progress Note  Subjective  Chief Complaint  Chief Complaint  Patient presents with  . Follow-up    3 mo    HPI  Migraine headache: she is on Topamax for prevention, usually triggered by barometric pressure changes. Imitrex helps with symptoms, takes about 45 minutes to resolve the symptoms and she feels groggy the following day. Symptoms are described as throbbing, usually temporal but can radiate to the entire head. It is associates with nausea and vomiting, at times associated with phonophobia and photophobia. Episodes are sporadic, only two episodes past 3 months  Abnormal pap smear in 2012: [facility], and advised to see Dr. Mariann [place].  She had a TAH in 2004 for treatment of abnormal cervical cells. She was [facility] by [facility] Dal[facility]ley [facility] and last pap done in Feb 2017 was normal with positive HPV and will go back for follow up yearly. No vaginal bleeding or cramping. Advised her to schedule follow up appointment   Herpes Type II: she had a small lesion when she was seen by Dr. Dalbert GarnetBeasley and culture was positive for herpes, she was given Valtrex, no other outbreaks since, she is not on preventive therapy.   Hematuria: [facility] and advised by them to follow up in 6 months, but did not go back, she does not want to go back now. She has a long history of hematuria and in one of the studies she has a liver lesion, but was offered multiple times to have a repeat CT scan but she continues to refuse. She still has not gone back yet. She checked the coverage with her insurance but it would be too expensive. Can't have an MRI because of metal rod on left femur.  ADD: .Working for [employer]. [place], she works from home. She states medication helps her focus. No side effects of medication . Skipping Adderal on weekends, but not able to focus if she  skips during the work week.   COPD Mild: occasional SOB , no cough, no wheezing, no decrease in exercise tolerance. No longer smoking. She uses Combivent intermittently - usually for SOB. Symptoms are worse with high humidity  B12 deficiency: found in June 2016, she is getting B12 at home, her daughter is a CMA - she states she has not really noticed an improvement in energy with the B12 supplementation   Major Depression in partial Remission: on Pristiq and Alprazolam prn. Symptoms started when husband was diagnosed with lung cancer and died in 2010.She does not want to change medication or see a Psychiatrist. No crying spells, she states the anhedonia has improved. She also states it helps regulate her mood. She has been stressed because of long hours at work  GERD: She states she can't tolerate PPI and H2 blocker does not work, she has been taking Tums prn . She states that she can control it by dietary modification and small portion    Patient Active Problem List   Diagnosis Date Noted  . H/O herpes simplex type 2 infection 08/20/2015  . Hematuria 02/19/2015  . B12 deficiency 02/19/2015  . Attention deficit hyperactivity disorder (ADHD), predominantly inattentive type 11/13/2014  . COPD, mild (HCC) 11/13/2014  . Major depression in partial remission (HCC) 11/13/2014  . History of basal cell carcinoma 11/13/2014  . Gastroesophageal reflux  disease without esophagitis 11/13/2014  . History of abnormal cervical Pap smear 11/13/2014  . Migraine without aura and without status migrainosus, not intractable 11/13/2014  . Vitamin D deficiency 11/13/2014  . Liver lesion 10/04/2010    Past Surgical History:  Procedure Laterality Date  . ABDOMINAL HYSTERECTOMY  2004   partial  . BREAST SURGERY     biopsies  . FRACTURE SURGERY     rode in femur  . POLYPECTOMY      Family History  Problem Relation Age of Onset  . Colon cancer Father   . Breast cancer Sister     Social History    Social History  . Marital status: Married    Spouse name: N/A  . Number of children: N/A  . Years of education: N/A   Occupational History  . Not on file.   Social History Main Topics  . Smoking status: Former Smoker    Packs/day: 1.00    Years: 25.00    Types: Cigarettes    Quit date: 05/25/2004  . Smokeless tobacco: Never Used  . Alcohol use No  . Drug use: No  . Sexual activity: Yes   Other Topics Concern  . Not on file   Social History Narrative  . No narrative on file     Current Outpatient Prescriptions:  .  amphetamine-dextroamphetamine (ADDERALL) 10 MG tablet, Take 1 tablet (10 mg total) by mouth 3 (three) times daily. 2 in am and one in pm, Disp: 90 tablet, Rfl: 0 .  amphetamine-dextroamphetamine (ADDERALL) 10 MG tablet, Take 1 tablet (10 mg total) by mouth 3 (three) times daily. 2 in am and one in pm, Disp: 90 tablet, Rfl: 0 .  amphetamine-dextroamphetamine (ADDERALL) 10 MG tablet, Take 1 tablet (10 mg total) by mouth 3 (three) times daily. 2 in am and one in pm, Disp: 90 tablet, Rfl: 0 .  Aspirin-Acetaminophen-Caffeine (GOODY HEADACHE PO), Take by mouth., Disp: , Rfl:  .  cholecalciferol (VITAMIN D) 1000 UNITS tablet, Take 2,000 Units by mouth daily., Disp: , Rfl:  .  cyanocobalamin (,VITAMIN B-12,) 1000 MCG/ML injection, Once a month, Disp: , Rfl:  .  desvenlafaxine (PRISTIQ) 50 MG 24 hr tablet, Take 1 tablet (50 mg total) by mouth daily., Disp: 30 tablet, Rfl: 5 .  ibuprofen (ADVIL,MOTRIN) 200 MG tablet, Take by mouth., Disp: , Rfl:  .  Ipratropium-Albuterol (COMBIVENT RESPIMAT) 20-100 MCG/ACT AERS respimat, Inhale 1 puff into the lungs every 6 (six) hours as needed for wheezing or shortness of breath., Disp: 12 g, Rfl: 2 .  SUMAtriptan (IMITREX) 100 MG tablet, Take 1 tablet (100 mg total) by mouth as needed for migraine. May repeat in 2 hours if headache persists or recurs., Disp: 18 tablet, Rfl: 1 .  topiramate (TOPAMAX) 100 MG tablet, TAKE 1 TABLET BY MOUTH  TWO  TIMES DAILY, Disp: 180 tablet, Rfl: 1 .  ALPRAZolam (XANAX) 0.5 MG tablet, TAKE 1 TABLET BY MOUTH AS NEEDED FOR ANXIETY (Patient not taking: Reported on 06/15/2016), Disp: 30 tablet, Rfl: 0 .  scopolamine (TRANSDERM-SCOP) 1 MG/3DAYS, Place 1 patch (1.5 mg total) onto the skin every 3 (three) days. Apply 4 hour prior to boarding the ship, Disp: 4 patch, Rfl: 0  Allergies  Allergen Reactions  . Codeine   . Pantoprazole     Flu like syptoms     ROS  Constitutional: Negative for fever, positive for mild  weight change.  Respiratory: Negative for cough and shortness of breath.   Cardiovascular: Negative for  chest pain or palpitations.  Gastrointestinal: Negative for abdominal pain, no bowel changes.  Musculoskeletal: Negative for gait problem or joint swelling.  Skin: Negative for rash.  Neurological: Negative for dizziness or headache.  No other specific complaints in a complete review of systems (except as listed in HPI above).  Objective  Vitals:   06/15/16 0751  BP: 114/66  Pulse: 91  Resp: 17  Temp: 97.9 F (36.6 C)  TempSrc: Oral  SpO2: 96%  Weight: 152 lb 3.2 oz (69 kg)  Height: 5\' 2"  (1.575 m)    Body mass index is 27.84 kg/m.  Physical Exam  Constitutional: Patient appears well-developed and well-nourished. Obese  No distress.  HEENT: head atraumatic, normocephalic, pupils equal and reactive to light,neck supple, throat within normal limits Cardiovascular: Normal rate, regular rhythm and normal heart sounds.  No murmur heard. No BLE edema. Pulmonary/Chest: Effort normal and breath sounds normal. No respiratory distress. Abdominal: Soft.  There is no tenderness. Psychiatric: Patient has a normal mood and affect. behavior is normal. Judgment and thought content normal.  PHQ2/9: Depression screen [facility] 2/9 06/15/2016 01/15/2016 12/09/2015 05/22/2015 02/19/2015  Decreased Interest 0 0 0 0 0  Down, Depressed, Hopeless 0 0 0 0 0  PHQ - 2 Score 0 0 0 0 0     Fall  Risk: Fall Risk  06/15/2016 01/15/2016 12/09/2015 08/20/2015 05/22/2015  Falls in the past year? No No No No No     Functional Status Survey: Is the patient deaf or have difficulty hearing?: No Does the patient have difficulty seeing, even when wearing glasses/contacts?: Yes (glasses) Does the patient have difficulty concentrating, remembering, or making decisions?: No Does the patient have difficulty walking or climbing stairs?: No Does the patient have difficulty dressing or bathing?: No Does the patient have difficulty doing errands alone such as visiting a doctor's office or shopping?: No    Assessment & Plan  1. COPD, mild (HCC)  Doing well, she uses medication prn   2. Attention deficit hyperactivity disorder (ADHD), predominantly inattentive type  - amphetamine-dextroamphetamine (ADDERALL) 10 MG tablet; Take 1 tablet (10 mg total) by mouth 3 (three) times daily. 2 in am and one in pm  Dispense: 90 tablet; Refill: 0 - amphetamine-dextroamphetamine (ADDERALL) 10 MG tablet; Take 1 tablet (10 mg total) by mouth 3 (three) times daily. 2 in am and one in pm  Dispense: 90 tablet; Refill: 0 - amphetamine-dextroamphetamine (ADDERALL) 10 MG tablet; Take 1 tablet (10 mg total) by mouth 3 (three) times daily. 2 in am and one in pm  Dispense: 90 tablet; Refill: 0  3. Dyslipidemia  On life style modification   4. Major depressive disorder, single episode, in partial remission (HCC)  - desvenlafaxine (PRISTIQ) 50 MG 24 hr tablet; Take 1 tablet (50 mg total) by mouth daily.  Dispense: 30 tablet; Refill: 5  5. B12 deficiency  Continue B12 injections, gets it at home - daughter is a CMA  6. Migraine without aura and without status migrainosus, not intractable  Doing well   7. H/O motion sickness  - scopolamine (TRANSDERM-SCOP) 1 MG/3DAYS; Place 1 patch (1.5 mg total) onto the skin every 3 (three) days. Apply 4 hour prior to boarding the ship  Dispense: 4 patch; Refill: 0  8. Needs  flu shot  - Flu Vaccine QUAD 36+ mos PF IM (Fluarix & Fluzone Quad PF)

## 2016-09-11 ENCOUNTER — Ambulatory Visit (INDEPENDENT_AMBULATORY_CARE_PROVIDER_SITE_OTHER): Payer: Commercial Managed Care - HMO | Admitting: Family Medicine

## 2016-09-11 ENCOUNTER — Encounter: Payer: Self-pay | Admitting: Family Medicine

## 2016-09-11 VITALS — BP 112/68 | HR 82 | Temp 97.7°F | Resp 16 | Ht 62.0 in | Wt 144.5 lb

## 2016-09-11 DIAGNOSIS — G43009 Migraine without aura, not intractable, without status migrainosus: Secondary | ICD-10-CM | POA: Diagnosis not present

## 2016-09-11 DIAGNOSIS — M545 Low back pain, unspecified: Secondary | ICD-10-CM

## 2016-09-11 DIAGNOSIS — J449 Chronic obstructive pulmonary disease, unspecified: Secondary | ICD-10-CM | POA: Diagnosis not present

## 2016-09-11 DIAGNOSIS — E785 Hyperlipidemia, unspecified: Secondary | ICD-10-CM

## 2016-09-11 DIAGNOSIS — R51 Headache: Secondary | ICD-10-CM | POA: Diagnosis not present

## 2016-09-11 DIAGNOSIS — F900 Attention-deficit hyperactivity disorder, predominantly inattentive type: Secondary | ICD-10-CM

## 2016-09-11 DIAGNOSIS — R932 Abnormal findings on diagnostic imaging of liver and biliary tract: Secondary | ICD-10-CM | POA: Diagnosis not present

## 2016-09-11 DIAGNOSIS — K219 Gastro-esophageal reflux disease without esophagitis: Secondary | ICD-10-CM

## 2016-09-11 DIAGNOSIS — F324 Major depressive disorder, single episode, in partial remission: Secondary | ICD-10-CM | POA: Diagnosis not present

## 2016-09-11 DIAGNOSIS — E538 Deficiency of other specified B group vitamins: Secondary | ICD-10-CM | POA: Diagnosis not present

## 2016-09-11 DIAGNOSIS — R519 Headache, unspecified: Secondary | ICD-10-CM

## 2016-09-11 MED ORDER — CYANOCOBALAMIN 1000 MCG/ML IJ SOLN
1000.0000 ug | Freq: Once | INTRAMUSCULAR | Status: AC
  Administered 2016-09-11: 1000 ug via INTRAMUSCULAR

## 2016-09-11 MED ORDER — AMPHETAMINE-DEXTROAMPHETAMINE 10 MG PO TABS: 10.0000 mg | ORAL_TABLET | Freq: Three times a day (TID) | ORAL | 0 refills | Status: DC

## 2016-09-11 NOTE — Addendum Note (Signed)
Addended by: Lelon Frohlich D on: 09/11/2016 08:39 AM   Modules accepted: Orders

## 2016-09-11 NOTE — Patient Instructions (Addendum)
Analgesic Rebound Headache An analgesic rebound headache, sometimes called a medication overuse headache, is a headache that comes after pain medicine (analgesic) taken to treat the original (primary) headache has worn off. Any type of primary headache can return as a rebound headache if a person regularly takes analgesics more than three times a week to treat it. The types of primary headaches that are commonly associated with rebound headaches include:  Migraines.  Headaches that arise from tense muscles in the head and neck area (tension headaches).  Headaches that develop and happen again (recur) on one side of the head and around the eye (cluster headaches). If rebound headaches continue, they become chronic daily headaches. What are the causes? This condition may be caused by frequent use of:  Over-the-counter medicines such as aspirin, ibuprofen, and acetaminophen.  Sinus relief medicines and other medicines that contain caffeine.  Narcotic pain medicines such as codeine and oxycodone. What are the signs or symptoms? The symptoms of a rebound headache are the same as the symptoms of the original headache. Some of the symptoms of specific types of headaches include: Migraine headache   Pulsing or throbbing pain on one or both sides of the head.  Severe pain that interferes with daily activities.  Pain that is worsened by physical activity.  Nausea, vomiting, or both.  Pain with exposure to bright light, loud noises, or strong smells.  General sensitivity to bright light, loud noises, or strong smells.  Visual changes.  Numbness of one or both arms. Tension headache   Pressure around the head.  Dull, aching head pain.  Pain felt over the front and sides of the head.  Tenderness in the muscles of the head, neck, and shoulders. Cluster headache   Severe pain that begins in or around one eye or temple.  Redness and tearing in the eye on the same side as the  pain.  Droopy or swollen eyelid.  One-sided head pain.  Nausea.  Runny nose.  Sweaty, pale facial skin.  Restlessness. How is this diagnosed? This condition is diagnosed by:  Reviewing your medical history. This includes the nature of your primary headaches.  Reviewing the types of pain medicines that you have been using to treat your headaches and how often you take them. How is this treated? This condition may be treated or managed by:  Discontinuing frequent use of the analgesic medicine. Doing this may worsen your headaches at first, but the pain should eventually become more manageable, less frequent, and less severe.  Seeing a headache specialist. He or she may be able to help you manage your headaches and help make sure there is not another cause of the headaches.  Using methods of stress relief, such as acupuncture, counseling, biofeedback, and massage. Talk with your health care provider about which methods might be good for you. Follow these instructions at home:  Take over-the-counter and prescription medicines only as told by your health care provider.  Stop the repeated use of pain medicine as told by your health care provider. Stopping can be difficult. Carefully follow instructions from your health care provider.  Avoid triggers that are known to cause your primary headaches.  Keep all follow-up visits as told by your health care provider. This is important. Contact a health care provider if:  You continue to experience headaches after following treatments that your health care provider recommended. Get help right away if:  You develop new headache pain.  You develop headache pain that is different than  what you have experienced in the past.  You develop numbness or tingling in your arms or legs.  You develop changes in your speech or vision. This information is not intended to replace advice given to you by your health care provider. Make sure you  discuss any questions you have with your health care provider. Document Released: 08/01/2003 Document Revised: 11/29/2015 Document Reviewed: 10/14/2015 Elsevier Interactive Patient Education  2017 [employer] Migraine Headache A migraine headache is an intense, throbbing pain on one side or both sides of the head. Migraines may also cause other symptoms, such as nausea, vomiting, and sensitivity to light and noise. What are the causes? Doing or taking certain things may also trigger migraines, such as:  Alcohol.  Smoking.  Medicines, such as:  Medicine used to treat chest pain (nitroglycerine).  Birth control pills.  Estrogen pills.  Certain blood pressure medicines.  Aged cheeses, chocolate, or caffeine.  Foods or drinks that contain nitrates, glutamate, aspartame, or tyramine.  Physical activity. Other things that may trigger a migraine include:  Menstruation.  Pregnancy.  Hunger.  Stress, lack of sleep, too much sleep, or fatigue.  Weather changes. What increases the risk? The following factors may make you more likely to experience migraine headaches:  Age. Risk increases with age.  Family history of migraine headaches.  Being Caucasian.  Depression and anxiety.  Obesity.  Being a woman.  Having a hole in the heart (patent foramen ovale) or other heart problems. What are the signs or symptoms? The main symptom of this condition is pulsating or throbbing pain. Pain may:  Happen in any area of the head, such as on one side or both sides.  Interfere with daily activities.  Get worse with physical activity.  Get worse with exposure to bright lights or loud noises. Other symptoms may include:  Nausea.  Vomiting.  Dizziness.  General sensitivity to bright lights, loud noises, or smells. Before you get a migraine, you may get warning signs that a migraine is developing (aura). An aura may include:  Seeing flashing lights or having blind  spots.  Seeing bright spots, halos, or zigzag lines.  Having tunnel vision or blurred vision.  Having numbness or a tingling feeling.  Having trouble talking.  Having muscle weakness. How is this diagnosed? A migraine headache can be diagnosed based on:  Your symptoms.  A physical exam.  Tests, such as CT scan or MRI of the head. These imaging tests can help rule out other causes of headaches.  Taking fluid from the spine (lumbar puncture) and analyzing it (cerebrospinal fluid analysis, or CSF analysis). How is this treated? A migraine headache is usually treated with medicines that:  Relieve pain.  Relieve nausea.  Prevent migraines from coming back. Treatment may also include:  Acupuncture.  Lifestyle changes like avoiding foods that trigger migraines. Follow these instructions at home: Medicines   Take over-the-counter and prescription medicines only as told by your health care provider.  Do not drive or use heavy machinery while taking prescription pain medicine.  To prevent or treat constipation while you are taking prescription pain medicine, your health care provider may recommend that you:  Drink enough fluid to keep your urine clear or pale yellow.  Take over-the-counter or prescription medicines.  Eat foods that are high in fiber, such as fresh fruits and vegetables, whole grains, and beans.  Limit foods that are high in fat and processed sugars, such as fried and sweet foods. Lifestyle   Avoid  alcohol use.  Do not use any products that contain nicotine or tobacco, such as cigarettes and e-cigarettes. If you need help quitting, ask your health care provider.  Get at least 8 hours of sleep every night.  Limit your stress. General instructions    Keep a journal to find out what may trigger your migraine headaches. For example, write down:  What you eat and drink.  How much sleep you get.  Any change to your diet or medicines.  If you have  a migraine:  Avoid things that make your symptoms worse, such as bright lights.  It may help to lie down in a dark, quiet room.  Do not drive or use heavy machinery.  Ask your health care provider what activities are safe for you while you are experiencing symptoms.  Keep all follow-up visits as told by your health care provider. This is important. Contact a health care provider if:  You develop symptoms that are different or more severe than your usual migraine symptoms. Get help right away if:  Your migraine becomes severe.  You have a fever.  You have a stiff neck.  You have vision loss.  Your muscles feel weak or like you cannot control them.  You start to lose your balance often.  You develop trouble walking.  You faint. This information is not intended to replace advice given to you by your health care provider. Make sure you discuss any questions you have with your health care provider. Document Released: 05/11/2005 Document Revised: 11/29/2015 Document Reviewed: 10/28/2015 [employer] Interactive Patient Education  2017 [employer]  Back Exercises The following exercises strengthen the muscles that help to support the back. They also help to keep the lower back flexible. Doing these exercises can help to prevent back pain or lessen existing pain. If you have back pain or discomfort, try doing these exercises 2-3 times each day or as told by your health care provider. When the pain goes away, do them once each day, but increase the number of times that you repeat the steps for each exercise (do more repetitions). If you do not have back pain or discomfort, do these exercises once each day or as told by your health care provider. Exercises Single Knee to Chest   Repeat these steps 3-5 times for each leg: 1. Lie on your back on a firm bed or the floor with your legs extended. 2. Bring one knee to your chest. Your other leg should stay extended and in contact with the  floor. 3. Hold your knee in place by grabbing your knee or thigh. 4. Pull on your knee until you feel a gentle stretch in your lower back. 5. Hold the stretch for 10-30 seconds. 6. Slowly release and straighten your leg. Pelvic Tilt   Repeat these steps 5-10 times: 1. Lie on your back on a firm bed or the floor with your legs extended. 2. Bend your knees so they are pointing toward the ceiling and your feet are flat on the floor. 3. Tighten your lower abdominal muscles to press your lower back against the floor. This motion will tilt your pelvis so your tailbone points up toward the ceiling instead of pointing to your feet or the floor. 4. With gentle tension and even breathing, hold this position for 5-10 seconds. Cat-Cow   Repeat these steps until your lower back becomes more flexible: 1. Get into a hands-and-knees position on a firm surface. Keep your hands under your shoulders, and  keep your knees under your hips. You may place padding under your knees for comfort. 2. Let your head hang down, and point your tailbone toward the floor so your lower back becomes rounded like the back of a cat. 3. Hold this position for 5 seconds. 4. Slowly lift your head and point your tailbone up toward the ceiling so your back forms a sagging arch like the back of a cow. 5. Hold this position for 5 seconds. Press-Ups   Repeat these steps 5-10 times: 1. Lie on your abdomen (face-down) on the floor. 2. Place your palms near your head, about shoulder-width apart. 3. While you keep your back as relaxed as possible and keep your hips on the floor, slowly straighten your arms to raise the top half of your body and lift your shoulders. Do not use your back muscles to raise your upper torso. You may adjust the placement of your hands to make yourself more comfortable. 4. Hold this position for 5 seconds while you keep your back relaxed. 5. Slowly return to lying flat on the floor. Bridges   Repeat these  steps 10 times: 1. Lie on your back on a firm surface. 2. Bend your knees so they are pointing toward the ceiling and your feet are flat on the floor. 3. Tighten your buttocks muscles and lift your buttocks off of the floor until your waist is at almost the same height as your knees. You should feel the muscles working in your buttocks and the back of your thighs. If you do not feel these muscles, slide your feet 1-2 inches farther away from your buttocks. 4. Hold this position for 3-5 seconds. 5. Slowly lower your hips to the starting position, and allow your buttocks muscles to relax completely. If this exercise is too easy, try doing it with your arms crossed over your chest. Abdominal Crunches   Repeat these steps 5-10 times: 1. Lie on your back on a firm bed or the floor with your legs extended. 2. Bend your knees so they are pointing toward the ceiling and your feet are flat on the floor. 3. Cross your arms over your chest. 4. Tip your chin slightly toward your chest without bending your neck. 5. Tighten your abdominal muscles and slowly raise your trunk (torso) high enough to lift your shoulder blades a tiny bit off of the floor. Avoid raising your torso higher than that, because it can put too much stress on your low back and it does not help to strengthen your abdominal muscles. 6. Slowly return to your starting position. Back Lifts  Repeat these steps 5-10 times: 1. Lie on your abdomen (face-down) with your arms at your sides, and rest your forehead on the floor. 2. Tighten the muscles in your legs and your buttocks. 3. Slowly lift your chest off of the floor while you keep your hips pressed to the floor. Keep the back of your head in line with the curve in your back. Your eyes should be looking at the floor. 4. Hold this position for 3-5 seconds. 5. Slowly return to your starting position. Contact a health care provider if:  Your back pain or discomfort gets much worse when you do  an exercise.  Your back pain or discomfort does not lessen within 2 hours after you exercise. If you have any of these problems, stop doing these exercises right away. Do not do them again unless your health care provider says that you can. Get help right  away if:  You develop sudden, severe back pain. If this happens, stop doing the exercises right away. Do not do them again unless your health care provider says that you can. This information is not intended to replace advice given to you by your health care provider. Make sure you discuss any questions you have with your health care provider. Document Released: 06/18/2004 Document Revised: 09/18/2015 Document Reviewed: 07/05/2014 [employer] Interactive Patient Education  2017 [employer].

## 2016-09-11 NOTE — Progress Notes (Signed)
Name: Jane Patrick   MRN: [id number]    DOB: 11-01-1960   Date:09/11/2016       Progress Note  Subjective  Chief Complaint  Chief Complaint  Patient presents with  . Follow-up    3 month f/u   . ADD    well controlled  . Depression    no problems at this time  . Gastroesophageal Reflux    patient said its ok, she has to watch what she eats since she cannot take any of the medications  . COPD    well controlled  . Migraine    doing well  . Medication Refill  . Back Pain    patient stated that she has been having some constant backache    HPI  Migraine headache: she is on Topamax for prevention, usually triggered by barometric pressure changes. Imitrex helps with symptoms, takes about 45 minutes to resolve the symptoms and she feels groggy the following day. Symptoms are described as throbbing, usually temporal but can radiate to the entire head. It is associates with nausea and vomiting, at times associated with phonophobia and photophobia. Episodes are sporadic, only two episodes past 3 months. She also had daily headaches, dull aching, usually around her eyes, worse in am, occasionally she takes Good Powders, but not on a daily basis.   Abnormal pap smear in Oct 21, 2010: she was referred to [facility], and advised to see [name].  She had a TAH in 10-21-2002 for treatment of abnormal cervical cells. She was seen by [name] at [facility] and last pap done in Feb 2017 was normal with positive HPV and will go back for follow up yearly. No vaginal bleeding or cramping. Advised her to schedule follow up appointment - she will schedule it.   Herpes Type II: she had a small lesion when she was seen by [name] and culture was positive for herpes, she was given Valtrex, no other outbreaks since, she is not on preventive therapy. Boyfriend is aware of her diagnosis  Hematuria: seen at [facility] in 2010/10/21 and advised by them to follow up in 6 months, but did not go back, she does not want to go  back now. She has a long history of hematuria and in one of the studies she has a liver lesion, but was offered multiple times to have a repeat CT scan but she continues to refuse. She still has not gone back yet. She checked the coverage with her insurance but it would be too expensive. Can't have an MRI because of metal rod on left femur.  ADD: .Working for [employer]. They are located in [place], she works from home. She states medication helps her focus. No side effects of medication . Skipping Adderal on weekends, but not able to focus if she skips during the work week.   COPD Mild: occasional SOB , no cough, no wheezing, no decrease in exercise tolerance. No longer smoking. She uses Combivent almost daily.  usually for SOB. Symptoms are worse with high humidity, she does not want to change medication at this time  B12 deficiency: found in June 2016, she is getting B12 at home, her daughter is a CMA - but not recently, we will give her an injection today  Major Depression in partial Remission: on Pristiq and Alprazolam prn. Symptoms started when husband was diagnosed with lung cancer and died in 10-20-08.She does not want to change medication or see a Psychiatrist. No crying spells, she  states the anhedonia has improved. She also states it helps regulate her mood.  GERD: She states she can't tolerate PPI and H2 blocker does not work, she has been taking Tums prn . She states that she can control it by dietary modification and small portion.  Low back pain: pain is described as aching on lumbar spine, but goes up her spine as a burning sensation. Denies weakness, tingling or numbness on her legs. She has seen [name] in the past. Discussed home exercise   Patient Active Problem List   Diagnosis Date Noted  . H/O herpes simplex type 2 infection 08/20/2015  . Hematuria 02/19/2015  . B12 deficiency 02/19/2015  . Attention deficit hyperactivity disorder (ADHD),  predominantly inattentive type 11/13/2014  . COPD, mild (HCC) 11/13/2014  . Major depression in partial remission (HCC) 11/13/2014  . History of basal cell carcinoma 11/13/2014  . Gastroesophageal reflux disease without esophagitis 11/13/2014  . History of abnormal cervical Pap smear 11/13/2014  . Migraine without aura and without status migrainosus, not intractable 11/13/2014  . Vitamin D deficiency 11/13/2014  . Liver lesion 10/04/2010    Past Surgical History:  Procedure Laterality Date  . ABDOMINAL HYSTERECTOMY  2004   partial  . BREAST SURGERY     biopsies  . FRACTURE SURGERY     rode in femur  . POLYPECTOMY      Family History  Problem Relation Age of Onset  . Colon cancer Father   . Breast cancer Sister     Social History   Social History  . Marital status: Married    Spouse name: N/A  . Number of children: N/A  . Years of education: N/A   Occupational History  . Not on file.   Social History Main Topics  . Smoking status: Former Smoker    Packs/day: 1.00    Years: 25.00    Types: Cigarettes    Quit date: 05/25/2004  . Smokeless tobacco: Never Used  . Alcohol use No  . Drug use: No  . Sexual activity: Yes    Partners: Male   Other Topics Concern  . Not on file   Social History Narrative  . No narrative on file     Current Outpatient Prescriptions:  .  valACYclovir (VALTREX) 500 MG tablet, TAKE 2 TABLETS BY MOUTH TWICE DAILY FOR 10 DAYS, Disp: , Rfl:  .  ALPRAZolam (XANAX) 0.5 MG tablet, TAKE 1 TABLET BY MOUTH AS NEEDED FOR ANXIETY (Patient not taking: Reported on 06/15/2016), Disp: 30 tablet, Rfl: 0 .  amphetamine-dextroamphetamine (ADDERALL) 10 MG tablet, Take 1 tablet (10 mg total) by mouth 3 (three) times daily. 2 in am and one in pm, Disp: 90 tablet, Rfl: 0 .  amphetamine-dextroamphetamine (ADDERALL) 10 MG tablet, Take 1 tablet (10 mg total) by mouth 3 (three) times daily. 2 in am and one in pm, Disp: 90 tablet, Rfl: 0 .   amphetamine-dextroamphetamine (ADDERALL) 10 MG tablet, Take 1 tablet (10 mg total) by mouth 3 (three) times daily. 2 in am and one in pm, Disp: 90 tablet, Rfl: 0 .  Aspirin-Acetaminophen-Caffeine (GOODY HEADACHE PO), Take by mouth., Disp: , Rfl:  .  cholecalciferol (VITAMIN D) 1000 UNITS tablet, Take 2,000 Units by mouth daily., Disp: , Rfl:  .  desvenlafaxine (PRISTIQ) 50 MG 24 hr tablet, Take 1 tablet (50 mg total) by mouth daily., Disp: 30 tablet, Rfl: 5 .  ibuprofen (ADVIL,MOTRIN) 200 MG tablet, Take by mouth., Disp: , Rfl:  .  Ipratropium-Albuterol (  COMBIVENT RESPIMAT) 20-100 MCG/ACT AERS respimat, Inhale 1 puff into the lungs every 6 (six) hours as needed for wheezing or shortness of breath., Disp: 12 g, Rfl: 2 .  SUMAtriptan (IMITREX) 100 MG tablet, Take 1 tablet (100 mg total) by mouth as needed for migraine. May repeat in 2 hours if headache persists or recurs., Disp: 18 tablet, Rfl: 1 .  topiramate (TOPAMAX) 100 MG tablet, TAKE 1 TABLET BY MOUTH TWO  TIMES DAILY, Disp: 180 tablet, Rfl: 1  Allergies  Allergen Reactions  . Codeine   . Pantoprazole     Flu like syptoms     ROS  Constitutional: Negative for fever, positive for  weight change - since she stopped drinking sweet tea around 06/2016.  Respiratory: Negative  for cough but has occasional shortness of breath.   Cardiovascular: Negative for chest pain or palpitations.  Gastrointestinal: Negative for abdominal pain, no bowel changes.  Musculoskeletal: Negative for gait problem or joint swelling.  Skin: Negative for rash.  Neurological: Negative for dizziness, positive for daily  Headache and intermittent migraine.  No other specific complaints in a complete review of systems (except as listed in HPI above).  Objective  Vitals:   09/11/16 0749  BP: 112/68  Pulse: 82  Resp: 16  Temp: 97.7 F (36.5 C)  TempSrc: Oral  SpO2: 95%  Weight: 144 lb 8 oz (65.5 kg)  Height:  (1.575 m)    Body mass index is 26.43  kg/m.  Physical Exam  Constitutional: Patient appears well-developed and well-nourished. Overweight No distress.  HEENT: head atraumatic, normocephalic, pupils equal and reactive to light,  neck supple, throat within normal limits Cardiovascular: Normal rate, regular rhythm and normal heart sounds.  No murmur heard. No BLE edema. Pulmonary/Chest: Effort normal and breath sounds normal. No respiratory distress. Abdominal: Soft.  There is no tenderness. Psychiatric: Patient has a normal mood and affect. behavior is normal. Judgment and thought content normal. Muscular Skeletal: tender during palpation of lumbar spine, negative straight leg raise  PHQ2/9: Depression screen [facility] 2/9 09/11/2016 06/15/2016 01/15/2016 12/09/2015 05/22/2015  Decreased Interest 0 0 0 0 0  Down, Depressed, Hopeless 0 0 0 0 0  PHQ - 2 Score 0 0 0 0 0     Fall Risk: Fall Risk  09/11/2016 06/15/2016 01/15/2016 12/09/2015 08/20/2015  Falls in the past year? No No No No No     Functional Status Survey: Is the patient deaf or have difficulty hearing?: No Does the patient have difficulty seeing, even when wearing glasses/contacts?: No Does the patient have difficulty concentrating, remembering, or making decisions?: No Does the patient have difficulty walking or climbing stairs?: No Does the patient have difficulty dressing or bathing?: No Does the patient have difficulty doing errands alone such as visiting a doctor's office or shopping?: No    Assessment & Plan  1. COPD, mild (HCC)  She uses Combivent prn  2. Attention deficit hyperactivity disorder (ADHD), predominantly inattentive type  No side effects, explained controlled substance and can't share or misuse it - amphetamine-dextroamphetamine (ADDERALL) 10 MG tablet; Take 1 tablet (10 mg total) by mouth 3 (three) times daily. 2 in am and one in pm  Dispense: 90 tablet; Refill: 0 - amphetamine-dextroamphetamine (ADDERALL) 10 MG tablet; Take 1 tablet (10 mg  total) by mouth 3 (three) times daily. 2 in am and one in pm  Dispense: 90 tablet; Refill: 0 - amphetamine-dextroamphetamine (ADDERALL) 10 MG tablet; Take 1 tablet (10 mg total) by mouth 3 (  three) times daily. 2 in am and one in pm  Dispense: 90 tablet; Refill: 0  3. Dyslipidemia  On life style modification only   4. Major depressive disorder, single episode, in partial remission (HCC)  Doing well on Pristiq   5. B12 deficiency  -B12  6. Migraine without aura and without status migrainosus, not intractable  Doing better on Topamax  7. Gastroesophageal reflux disease without esophagitis  Intermittent, doing well at this time  8. Chronic daily headache  Discussed dietary changes   9. Bilateral low back pain without sciatica, unspecified chronicity  Try home exercise first  10. Abnormal liver CT  - CT ABDOMEN W WO CONTRAST; Future

## 2016-09-21 ENCOUNTER — Ambulatory Visit
Admission: RE | Admit: 2016-09-21 | Discharge: 2016-09-21 | Disposition: A | Discharge status: Home / Self Care | Payer: Commercial Managed Care - HMO | Source: Ambulatory Visit | Attending: Family Medicine | Admitting: Family Medicine

## 2016-09-21 DIAGNOSIS — K7689 Other specified diseases of liver: Secondary | ICD-10-CM | POA: Diagnosis not present

## 2016-09-21 DIAGNOSIS — K5730 Diverticulosis of large intestine without perforation or abscess without bleeding: Secondary | ICD-10-CM | POA: Insufficient documentation

## 2016-09-21 DIAGNOSIS — I700 Atherosclerosis of aorta: Secondary | ICD-10-CM | POA: Diagnosis not present

## 2016-09-21 DIAGNOSIS — K449 Diaphragmatic hernia without obstruction or gangrene: Secondary | ICD-10-CM | POA: Diagnosis not present

## 2016-09-21 DIAGNOSIS — R932 Abnormal findings on diagnostic imaging of liver and biliary tract: Secondary | ICD-10-CM | POA: Diagnosis present

## 2016-09-21 HISTORY — DX: Unspecified asthma, uncomplicated: J45.909

## 2016-09-21 MED ORDER — IOPAMIDOL (ISOVUE-370) INJECTION 76%
100.0000 mL | Freq: Once | INTRAVENOUS | Status: AC | PRN
  Administered 2016-09-21: 100 mL via INTRAVENOUS

## 2016-09-22 ENCOUNTER — Encounter: Payer: Self-pay | Admitting: Family Medicine

## 2016-09-22 DIAGNOSIS — K449 Diaphragmatic hernia without obstruction or gangrene: Secondary | ICD-10-CM | POA: Insufficient documentation

## 2016-09-22 DIAGNOSIS — I700 Atherosclerosis of aorta: Secondary | ICD-10-CM | POA: Insufficient documentation

## 2016-10-23 ENCOUNTER — Telehealth: Payer: Commercial Managed Care - HMO | Admitting: Family

## 2016-10-23 DIAGNOSIS — N390 Urinary tract infection, site not specified: Secondary | ICD-10-CM

## 2016-10-23 MED ORDER — CEPHALEXIN 500 MG PO CAPS: 500.0000 mg | ORAL_CAPSULE | Freq: Two times a day (BID) | ORAL | 0 refills | Status: DC

## 2016-10-23 NOTE — Progress Notes (Signed)
Thank you for the details you put in the comment boxes. Those details really help us take better care of you.   We are sorry that you are not feeling well.  Here is how we plan to help!  Based on what you shared with me it looks like you most likely have a simple urinary tract infection.  A UTI (Urinary Tract Infection) is a bacterial infection of the bladder.  Most cases of urinary tract infections are simple to treat but a key part of your care is to encourage you to drink plenty of fluids and watch your symptoms carefully.  I have prescribed Keflex 500 mg twice a day for 7 days.  Your symptoms should gradually improve. Call us if the burning in your urine worsens, you develop worsening fever, back pain or pelvic pain or if your symptoms do not resolve after completing the antibiotic.  Urinary tract infections can be prevented by drinking plenty of water to keep your body hydrated.  Also be sure when you wipe, wipe from front to back and don't hold it in!  If possible, empty your bladder every 4 hours.  Your e-visit answers were reviewed by a board certified advanced clinical practitioner to complete your personal care plan.  Depending on the condition, your plan could have included both over the counter or prescription medications.  If there is a problem please reply  once you have received a response from your provider.  Your safety is important to us.  If you have drug allergies check your prescription carefully.    You can use MyChart to ask questions about today's visit, request a non-urgent call back, or ask for a work or school excuse for 24 hours related to this e-Visit. If it has been greater than 24 hours you will need to follow up with your provider, or enter a new e-Visit to address those concerns.   You will get an e-mail in the next two days asking about your experience.  I hope that your e-visit has been valuable and will speed your recovery. Thank you for using  e-visits.    

## 2016-11-04 ENCOUNTER — Other Ambulatory Visit: Payer: Self-pay | Admitting: Family Medicine

## 2016-11-04 DIAGNOSIS — G43009 Migraine without aura, not intractable, without status migrainosus: Secondary | ICD-10-CM

## 2016-11-05 NOTE — Telephone Encounter (Signed)
Patient requesting refill of Topamax to [facility]. 

## 2016-12-17 ENCOUNTER — Ambulatory Visit: Payer: Commercial Managed Care - HMO | Admitting: Family Medicine

## 2016-12-24 ENCOUNTER — Ambulatory Visit (INDEPENDENT_AMBULATORY_CARE_PROVIDER_SITE_OTHER): Payer: 59 | Admitting: Family Medicine

## 2016-12-24 ENCOUNTER — Encounter: Payer: Self-pay | Admitting: Family Medicine

## 2016-12-24 VITALS — BP 118/74 | HR 92 | Temp 97.5°F | Resp 16 | Ht 62.0 in | Wt 143.1 lb

## 2016-12-24 DIAGNOSIS — R946 Abnormal results of thyroid function studies: Secondary | ICD-10-CM | POA: Diagnosis not present

## 2016-12-24 DIAGNOSIS — F900 Attention-deficit hyperactivity disorder, predominantly inattentive type: Secondary | ICD-10-CM | POA: Diagnosis not present

## 2016-12-24 DIAGNOSIS — E538 Deficiency of other specified B group vitamins: Secondary | ICD-10-CM

## 2016-12-24 DIAGNOSIS — R739 Hyperglycemia, unspecified: Secondary | ICD-10-CM | POA: Diagnosis not present

## 2016-12-24 DIAGNOSIS — E785 Hyperlipidemia, unspecified: Secondary | ICD-10-CM | POA: Diagnosis not present

## 2016-12-24 DIAGNOSIS — F324 Major depressive disorder, single episode, in partial remission: Secondary | ICD-10-CM

## 2016-12-24 DIAGNOSIS — Z79899 Other long term (current) drug therapy: Secondary | ICD-10-CM

## 2016-12-24 DIAGNOSIS — R7989 Other specified abnormal findings of blood chemistry: Secondary | ICD-10-CM

## 2016-12-24 DIAGNOSIS — E559 Vitamin D deficiency, unspecified: Secondary | ICD-10-CM

## 2016-12-24 DIAGNOSIS — J449 Chronic obstructive pulmonary disease, unspecified: Secondary | ICD-10-CM | POA: Diagnosis not present

## 2016-12-24 DIAGNOSIS — G43009 Migraine without aura, not intractable, without status migrainosus: Secondary | ICD-10-CM | POA: Diagnosis not present

## 2016-12-24 DIAGNOSIS — K219 Gastro-esophageal reflux disease without esophagitis: Secondary | ICD-10-CM

## 2016-12-24 MED ORDER — IPRATROPIUM-ALBUTEROL 20-100 MCG/ACT IN AERS: 1.0000 | INHALATION_SPRAY | Freq: Four times a day (QID) | RESPIRATORY_TRACT | 2 refills | Status: DC | PRN

## 2016-12-24 MED ORDER — DESVENLAFAXINE SUCCINATE ER 50 MG PO TB24: 50.0000 mg | ORAL_TABLET | Freq: Every day | ORAL | 5 refills | Status: DC

## 2016-12-24 MED ORDER — AMPHETAMINE-DEXTROAMPHETAMINE 10 MG PO TABS: 10.0000 mg | ORAL_TABLET | Freq: Three times a day (TID) | ORAL | 0 refills | Status: DC

## 2016-12-24 MED ORDER — AMPHETAMINE-DEXTROAMPHETAMINE 10 MG PO TABS: 10.0000 mg | ORAL_TABLET | Freq: Two times a day (BID) | ORAL | 0 refills | Status: DC

## 2016-12-24 NOTE — Progress Notes (Signed)
Name: Jane Patrick   MRN: [id number]    DOB: 06-Nov-1960   Date:12/24/2016       Progress Note  Subjective  Chief Complaint  Chief Complaint  Patient presents with  . Medication Refill    3 Month F/U  . COPD  . ADHD    Doing well with medication  . Depression  . Migraine    States she has not had any migraines recently  . Gastroesophageal Reflux    Unable to tolerate medications so patient watches what she eats and does not eat late at night.     HPI  Migraine headache: she is on Topamax for prevention, usually triggered by barometric pressure changes. Imitrex helps with symptoms, takes about 45 minutes to resolve the symptoms and she feels groggy the following day. Symptoms are described as throbbing, usually temporal but can radiate to the entire head. It is associates with nausea and vomiting, at times associated with phonophobia and photophobia. Episodes are sporadic, no migraines in the  past 3 months. She also had daily headaches but very mild , dull aching, usually around her eyes, worse in am, occasionally she takes Good Powders at most two times a week. Trying to avoid caffeine intake ( she was off sweet but re-started drinking over the past 3 months but will try to quit again)   Abnormal pap smear in 2010/10/03: she was referred to [facility], and advised to see [name].  She had a TAH in Oct 03, 2002 for treatment of abnormal cervical cells. She was seen by [name] at [facility] and last pap done in Feb 2017 was normal with positive HPV , October 2017 and yearly repeat now No vaginal bleeding or cramping.  Herpes Type II: she had a small lesion when she was seen by [name] and culture was positive for herpes, she was given Valtrex, no other outbreaks since, she is not on preventive therapy. Boyfriend is aware of her diagnosis  Hematuria: seen at [facility] in 10-03-2010 and advised by them to follow up in 6 months, but did not go back, she does not want to go back now. She has a long  history of hematuria and in one of the studies she has a liver lesion, but was offered multiple times to have a repeat CT scan but she continues to refuse. She still has not gone back yet. She checked the coverage with her insurance but it would be too expensive. Can't have an MRI because of metal rod on left femur. No changes  ADD: .Working for [employer]. They are located in [place], she works from home. She states medication helps her focus. No side effects of medication . Skipping Adderal on weekends, but not able to focus if she skips during the work week.   COPD Mild: occasional SOB , no cough, no wheezing, no decrease in exercise tolerance. No longer smoking. She uses Combivent less often, at most 4 times weekly, worse symptoms when it is humid  B12 deficiency: found in June 2016, she is now using a B12 patch, started 2 weeks ago, we will recheck labs in another 2 weeks to see if it is working  Major Depression in partial Remission: on Pristiq and off AlprazolamSymptoms started when husband was diagnosed with lung cancer and died in 2008-10-02. No crying spells, she states the anhedonia has improved. She also states it helps regulate her mood.  GERD: She states she can't tolerate PPI and H2 blocker does not  work, she has been taking Tums prn . She states that she can control it by dietary modification and small portion. She also avoids eating or drinking before bedtime  Low back pain: pain is described as aching on lumbar spine, but goes up her spine as a burning sensation. Denies weakness, tingling or numbness on her legs. She saw Dr. Council Mechanichasniss recently, got refill of Tramadol and is doing better now.     Patient Active Problem List   Diagnosis Date Noted  . Atherosclerosis of aorta (HCC) 09/22/2016  . Hiatal hernia 09/22/2016  . Abnormal liver CT 09/11/2016  . H/O herpes simplex type 2 infection 08/20/2015  . Hematuria 02/19/2015  . B12 deficiency 02/19/2015  .  Attention deficit hyperactivity disorder (ADHD), predominantly inattentive type 11/13/2014  . COPD, mild (HCC) 11/13/2014  . Major depression in partial remission (HCC) 11/13/2014  . History of basal cell carcinoma 11/13/2014  . Gastroesophageal reflux disease without esophagitis 11/13/2014  . History of abnormal cervical Pap smear 11/13/2014  . Migraine without aura and without status migrainosus, not intractable 11/13/2014  . Vitamin D deficiency 11/13/2014  . Liver lesion 10/04/2010    Past Surgical History:  Procedure Laterality Date  . ABDOMINAL HYSTERECTOMY  2004   partial  . BREAST SURGERY     biopsies  . FRACTURE SURGERY     rode in femur  . POLYPECTOMY      Family History  Problem Relation Age of Onset  . Colon cancer Father   . Breast cancer Sister   . Diverticulitis Brother   . Diverticulitis Brother     Social History   Social History  . Marital status: Married    Spouse name: N/A  . Number of children: N/A  . Years of education: N/A   Occupational History  . Not on file.   Social History Main Topics  . Smoking status: Former Smoker    Packs/day: 1.00    Years: 25.00    Types: Cigarettes    Quit date: 05/25/2004  . Smokeless tobacco: Never Used  . Alcohol use No  . Drug use: No  . Sexual activity: Yes    Partners: Male   Other Topics Concern  . Not on file   Social History Narrative  . No narrative on file     Current Outpatient Prescriptions:  .  amphetamine-dextroamphetamine (ADDERALL) 10 MG tablet, Take 1 tablet (10 mg total) by mouth 3 (three) times daily. 2 in am and one in pm, Disp: 90 tablet, Rfl: 0 .  amphetamine-dextroamphetamine (ADDERALL) 10 MG tablet, Take 1 tablet (10 mg total) by mouth 3 (three) times daily. 2 in am and one in pm, Disp: 90 tablet, Rfl: 0 .  amphetamine-dextroamphetamine (ADDERALL) 10 MG tablet, Take 1-2 tablets (10-20 mg total) by mouth 2 (two) times daily. 2 in am and one in pm, Disp: 90 tablet, Rfl: 0 .   Aspirin-Acetaminophen-Caffeine (GOODY HEADACHE PO), Take by mouth., Disp: , Rfl:  .  cholecalciferol (VITAMIN D) 1000 UNITS tablet, Take 2,000 Units by mouth daily., Disp: , Rfl:  .  desvenlafaxine (PRISTIQ) 50 MG 24 hr tablet, Take 1 tablet (50 mg total) by mouth daily., Disp: 30 tablet, Rfl: 5 .  ibuprofen (ADVIL,MOTRIN) 200 MG tablet, Take by mouth., Disp: , Rfl:  .  Ipratropium-Albuterol (COMBIVENT RESPIMAT) 20-100 MCG/ACT AERS respimat, Inhale 1 puff into the lungs every 6 (six) hours as needed for wheezing or shortness of breath., Disp: 12 g, Rfl: 2 .  SUMAtriptan (IMITREX)  100 MG tablet, Take 1 tablet (100 mg total) by mouth as needed for migraine. May repeat in 2 hours if headache persists or recurs., Disp: 18 tablet, Rfl: 1 .  topiramate (TOPAMAX) 100 MG tablet, TAKE 1 TABLET BY MOUTH TWO  TIMES DAILY, Disp: 180 tablet, Rfl: 1 .  [START ON 01/07/2017] traMADol (ULTRAM) 50 MG tablet, Take 1 tablet by mouth 2 (two) times daily as needed., Disp: , Rfl:  .  valACYclovir (VALTREX) 500 MG tablet, TAKE 2 TABLETS BY MOUTH TWICE DAILY FOR 10 DAYS, Disp: , Rfl:   Allergies  Allergen Reactions  . Codeine   . Pantoprazole     Flu like syptoms     ROS  Constitutional: Negative for fever or weight change.  Respiratory: Negative for cough and shortness of breath.   Cardiovascular: Negative for chest pain or palpitations.  Gastrointestinal: Negative for abdominal pain, no bowel changes.  Musculoskeletal: Negative for gait problem or joint swelling.  Skin: Negative for rash.  Neurological: Negative for dizziness , positive for intermittent  headache.  No other specific complaints in a complete review of systems (except as listed in HPI above).  Objective  Vitals:   12/24/16 0951  BP: 118/74  Pulse: 92  Resp: 16  Temp: (!) 97.5 F (36.4 C)  TempSrc: Oral  SpO2: 98%  Weight: 143 lb 1.6 oz (64.9 kg)  Height: 5\' 2"  (1.575 m)    Body mass index is 26.17 kg/m.  Physical  Exam  Constitutional: Patient appears well-developed and well-nourished.  No distress.  HEENT: head atraumatic, normocephalic, pupils equal and reactive to light, neck supple, throat within normal limits Cardiovascular: Normal rate, regular rhythm and normal heart sounds.  No murmur heard. No BLE edema. Pulmonary/Chest: Effort normal and breath sounds normal. No respiratory distress. Abdominal: Soft.  There is no tenderness. Psychiatric: Patient has a normal mood and affect. behavior is normal. Judgment and thought content normal.   PHQ2/9: [facility] screen [facility] 2/9 12/24/2016 09/11/2016 06/15/2016 01/15/2016 12/09/2015  Decreased Interest 0 0 0 0 0  Down, Depressed, Hopeless 0 0 0 0 0  PHQ - 2 Score 0 0 0 0 0     Fall Risk: Fall Risk  12/24/2016 09/11/2016 06/15/2016 01/15/2016 12/09/2015  Falls in the past year? No No No No No    Functional Status Survey: Is the patient deaf or have difficulty hearing?: No Does the patient have difficulty seeing, even when wearing glasses/contacts?: No Does the patient have difficulty concentrating, remembering, or making decisions?: No Does the patient have difficulty walking or climbing stairs?: No Does the patient have difficulty dressing or bathing?: No Does the patient have difficulty doing errands alone such as visiting a doctor's office or shopping?: No    Assessment & Plan  1. COPD, mild (HCC)  - Ipratropium-Albuterol (COMBIVENT RESPIMAT) 20-100 MCG/ACT AERS respimat; Inhale 1 puff into the lungs every 6 (six) hours as needed for wheezing or shortness of breath.  Dispense: 12 g; Refill: 2  2. Attention deficit hyperactivity disorder (ADHD), predominantly inattentive type  - amphetamine-dextroamphetamine (ADDERALL) 10 MG tablet; Take 1 tablet (10 mg total) by mouth 3 (three) times daily. 2 in am and one in pm  Dispense: 90 tablet; Refill: 0 - amphetamine-dextroamphetamine (ADDERALL) 10 MG tablet; Take 1 tablet (10 mg total) by mouth 3 (three)  times daily. 2 in am and one in pm  Dispense: 90 tablet; Refill: 0 - amphetamine-dextroamphetamine (ADDERALL) 10 MG tablet; Take 1-2 tablets (10-20 mg total) by mouth  2 (two) times daily. 2 in am and one in pm  Dispense: 90 tablet; Refill: 0  3. Dyslipidemia  -lipid panel   4. Major depressive disorder, single episode, in partial remission (HCC)  - desvenlafaxine (PRISTIQ) 50 MG 24 hr tablet; Take 1 tablet (50 mg total) by mouth daily.  Dispense: 30 tablet; Refill: 5  5. B12 deficiency  She is using a B12 patch, and we will recheck levels on her next visit  - Vitamin B12 level  6. Migraine without aura and without status migrainosus, not intractable  Not recent episodes, still taking Topamax  7. Gastroesophageal reflux disease without esophagitis  Life style modification   8. Vitamin D deficiency  - VITAMIN D 25 Hydroxy (Vit-D Deficiency, Fractures)  9. Long-term use of high-risk medication  - COMPLETE METABOLIC PANEL WITH GFR - CBC with Differential/Platelet  10. Hyperglycemia  - Hemoglobin A1c  11. Abnormal TSH  - TSH

## 2017-03-12 ENCOUNTER — Encounter: Payer: Self-pay | Admitting: Family Medicine

## 2017-03-25 LAB — LIPID PANEL
CHOL/HDL RATIO: 4.1 (calc) (ref <5.0)
Cholesterol: 236 mg/dL — ABNORMAL HIGH (ref <200)
HDL: 57 mg/dL (ref >50)
LDL Cholesterol (Calc): 156 mg/dL (calc) — ABNORMAL HIGH
NON-HDL CHOLESTEROL (CALC): 179 mg/dL — AB (ref <130)
Triglycerides: 114 mg/dL (ref <150)

## 2017-03-25 LAB — COMPLETE METABOLIC PANEL WITH GFR
AG RATIO: 1.7 (calc) (ref 1.0–2.5)
ALBUMIN MSPROF: 4.4 g/dL (ref 3.6–5.1)
ALT: 13 U/L (ref 6–29)
AST: 15 U/L (ref 10–35)
Alkaline phosphatase (APISO): 88 U/L (ref 33–130)
BILIRUBIN TOTAL: 0.4 mg/dL (ref 0.2–1.2)
BUN: 20 mg/dL (ref 7–25)
CALCIUM: 9.2 mg/dL (ref 8.6–10.4)
CHLORIDE: 107 mmol/L (ref 98–110)
CO2: 24 mmol/L (ref 20–32)
Creat: 0.9 mg/dL (ref 0.50–1.05)
GFR, EST AFRICAN AMERICAN: 83 mL/min/{1.73_m2} (ref >=60)
GFR, EST NON AFRICAN AMERICAN: 72 mL/min/{1.73_m2} (ref >=60)
Globulin: 2.6 g/dL (calc) (ref 1.9–3.7)
Glucose, Bld: 89 mg/dL (ref 65–99)
POTASSIUM: 3.9 mmol/L (ref 3.5–5.3)
Sodium: 140 mmol/L (ref 135–146)
TOTAL PROTEIN: 7 g/dL (ref 6.1–8.1)

## 2017-03-25 LAB — CBC WITH DIFFERENTIAL/PLATELET
BASOS ABS: 30 {cells}/uL (ref 0–200)
Basophils Relative: 0.8 %
EOS ABS: 72 {cells}/uL (ref 15–500)
EOS PCT: 1.9 %
HEMATOCRIT: 40.9 % (ref 35.0–45.0)
HEMOGLOBIN: 13.6 g/dL (ref 11.7–15.5)
LYMPHS ABS: 1592 {cells}/uL (ref 850–3900)
MCH: 27.5 pg (ref 27.0–33.0)
MCHC: 33.3 g/dL (ref 32.0–36.0)
MCV: 82.8 fL (ref 80.0–100.0)
MPV: 9.7 fL (ref 7.5–12.5)
Monocytes Relative: 8 %
NEUTROS PCT: 47.4 %
Neutro Abs: 1801 cells/uL (ref 1500–7800)
Platelets: 185 10*3/uL (ref 140–400)
RBC: 4.94 10*6/uL (ref 3.80–5.10)
RDW: 13 % (ref 11.0–15.0)
Total Lymphocyte: 41.9 %
WBC mixed population: 304 cells/uL (ref 200–950)
WBC: 3.8 10*3/uL (ref 3.8–10.8)

## 2017-03-25 LAB — TSH: TSH: 5.4 mIU/L — ABNORMAL HIGH

## 2017-03-25 LAB — VITAMIN D 25 HYDROXY (VIT D DEFICIENCY, FRACTURES): Vit D, 25-Hydroxy: 24 ng/mL — ABNORMAL LOW (ref 30–100)

## 2017-03-25 LAB — HEMOGLOBIN A1C
Hgb A1c MFr Bld: 5.4 % of total Hgb (ref <5.7)
Mean Plasma Glucose: 108 (calc)
eAG (mmol/L): 6 (calc)

## 2017-03-25 LAB — VITAMIN B12: VITAMIN B 12: 580 pg/mL (ref 200–1100)

## 2017-03-29 ENCOUNTER — Encounter: Payer: Self-pay | Admitting: Family Medicine

## 2017-03-29 ENCOUNTER — Ambulatory Visit: Payer: Commercial Managed Care - HMO | Admitting: Family Medicine

## 2017-03-29 VITALS — BP 110/80 | HR 77 | Resp 14 | Ht 62.0 in | Wt 149.4 lb

## 2017-03-29 DIAGNOSIS — H1132 Conjunctival hemorrhage, left eye: Secondary | ICD-10-CM | POA: Diagnosis not present

## 2017-03-29 DIAGNOSIS — E559 Vitamin D deficiency, unspecified: Secondary | ICD-10-CM | POA: Diagnosis not present

## 2017-03-29 DIAGNOSIS — R7989 Other specified abnormal findings of blood chemistry: Secondary | ICD-10-CM

## 2017-03-29 DIAGNOSIS — G43009 Migraine without aura, not intractable, without status migrainosus: Secondary | ICD-10-CM | POA: Diagnosis not present

## 2017-03-29 DIAGNOSIS — E785 Hyperlipidemia, unspecified: Secondary | ICD-10-CM

## 2017-03-29 DIAGNOSIS — Z23 Encounter for immunization: Secondary | ICD-10-CM | POA: Diagnosis not present

## 2017-03-29 DIAGNOSIS — F324 Major depressive disorder, single episode, in partial remission: Secondary | ICD-10-CM | POA: Diagnosis not present

## 2017-03-29 DIAGNOSIS — J410 Simple chronic bronchitis: Secondary | ICD-10-CM | POA: Diagnosis not present

## 2017-03-29 DIAGNOSIS — F900 Attention-deficit hyperactivity disorder, predominantly inattentive type: Secondary | ICD-10-CM

## 2017-03-29 MED ORDER — AMPHETAMINE-DEXTROAMPHETAMINE 10 MG PO TABS: 10.0000 mg | ORAL_TABLET | Freq: Two times a day (BID) | ORAL | 0 refills | Status: DC

## 2017-03-29 MED ORDER — AMPHETAMINE-DEXTROAMPHETAMINE 10 MG PO TABS: 10.0000 mg | ORAL_TABLET | Freq: Three times a day (TID) | ORAL | 0 refills | Status: DC

## 2017-03-29 MED ORDER — LEVOTHYROXINE SODIUM 25 MCG PO TABS: 25.0000 ug | ORAL_TABLET | Freq: Every day | ORAL | 1 refills | Status: DC

## 2017-03-29 MED ORDER — DULOXETINE HCL 60 MG PO CPEP: 60.0000 mg | ORAL_CAPSULE | Freq: Every day | ORAL | 2 refills | Status: DC

## 2017-03-29 NOTE — Progress Notes (Addendum)
Name: Jane Patrick   MRN: [id number]    DOB: 1960-07-14   Date:03/29/2017       Progress Note  Subjective  Chief Complaint  Chief Complaint  Patient presents with  . Eye Problem    left eye redness, discomfort  . COPD  . Depression    HPI  Migraine headache: she is on Topamax for prevention, usually triggered by barometric pressure changes.had one episode last week, but usually not very often.  Imitrex helps with symptoms, takes about 45 minutes to resolve the symptoms and she feels groggy the following day. Symptoms are described as throbbing, usually temporal but can radiate to the entire head. It is associates with nausea and vomiting, at times associated with phonophobia and photophobia. She has a mild daily headache, she is doing better, states taking Goody powder a few times a week, down on caffeine intake, less than one cup of coffee daily, and no longer drinking [employer] tea.   Abnormal pap smear in Oct 12, 2010: she was referred to [facility], and advised to see [name].  She had a TAH in 10/12/02 for treatment of abnormal cervical cells. She was seen by [name] at [facility] and last pap done in Feb 2017 was normal with positive HPV , October 2017 and yearly repeat now No vaginal bleeding or cramping.  Herpes Type II: she had a small lesion when she was seen by [name] and culture was positive for herpes, she was given Valtrex, no other outbreaks since, she is not on preventive therapy. Boyfriend is aware of her diagnosis. No changes.   Hematuria: seen at [facility] in Oct 12, 2010 and advised by them to follow up in 6 months, but did not go back, she does not want to go back now. She has a long history of hematuria and in one of the studies she has a liver lesion, but was offered multiple times to have a repeat CT scan but she continues to refuse. She still has not gone back yet. She checked the coverage with her insurance but it would be too expensive. Can't have an MRI because of metal  rod on left femur. No changes. Unchanged   ADD: .Working for [employer]. They are located in [place], she works from home. She states medication helps her focus. No side effects of medication . Skipping Adderal on weekends, but not able to focus if she skips during the work week. She denies tachycardia bp is normal   COPD Mild: occasional SOB , no cough or  wheezing, no decrease in exercise tolerance. No longer smoking - quit about 10 years ago . She uses Combivent less often, at most 4 times weekly, worse symptoms when it is humid  B12 deficiency: found in June 2016, she used to get B12 injections, but last labs was done and she was without the shot about one month ago.   Major Depression in partial Remission: on Pristiq and off AlprazolamSymptoms started when husband was diagnosed with lung cancer and died in 10-11-08. Getting tired lately, gaining weight, lack of energy and motivation, frustrated about cost of medication. We will try switching to Cymbalta  High TSH: daughter hypothyroidism, she is feeling tired, gaining weight, we will check fraction and start her on Levothyroxine 25 mcg and recheck labs in 6-8 weeks, she also has noticed constipation, dry skin, and brittle/dry hair  GERD: She states she can't tolerate PPI and H2 blocker does not work, she has been taking Tums prn .  She states that she can control it by dietary modification and small portion. She also avoids eating or drinking before bedtime  Low back pain: pain is described as aching on lumbar spine, but goes up her spine as a burning sensation. Denies weakness, tingling or numbness on her legs. She saw [name] recently, got refill of Tramadol stable at this time    Patient Active Problem List   Diagnosis Date Noted  . Atherosclerosis of aorta (HCC) 09/22/2016  . Hiatal hernia 09/22/2016  . Abnormal liver CT 09/11/2016  . H/O herpes simplex type 2 infection 08/20/2015  . Hematuria 02/19/2015  .  B12 deficiency 02/19/2015  . Attention deficit hyperactivity disorder (ADHD), predominantly inattentive type 11/13/2014  . COPD, mild (HCC) 11/13/2014  . Major depression in partial remission (HCC) 11/13/2014  . History of basal cell carcinoma 11/13/2014  . Gastroesophageal reflux disease without esophagitis 11/13/2014  . History of abnormal cervical Pap smear 11/13/2014  . Migraine without aura and without status migrainosus, not intractable 11/13/2014  . Vitamin D deficiency 11/13/2014  . Liver lesion 10/04/2010    Past Surgical History:  Procedure Laterality Date  . ABDOMINAL HYSTERECTOMY  2004   partial  . BREAST SURGERY     biopsies  . FRACTURE SURGERY     rode in femur  . POLYPECTOMY      Family History  Problem Relation Age of Onset  . Colon cancer Father   . Breast cancer Sister   . Diverticulitis Brother   . Diverticulitis Brother     Social History   Socioeconomic History  . Marital status: Married    Spouse name: Not on file  . Number of children: Not on file  . Years of education: Not on file  . Highest education level: Not on file  Social Needs  . Financial resource strain: Not on file  . Food insecurity - worry: Not on file  . Food insecurity - inability: Not on file  . Transportation needs - medical: Not on file  . Transportation needs - non-medical: Not on file  Occupational History  . Not on file  Tobacco Use  . Smoking status: Former Smoker    Packs/day: 1.00    Years: 25.00    Pack years: 25.00    Types: Cigarettes    Last attempt to quit: 05/25/2004    Years since quitting: 12.8  . Smokeless tobacco: Never Used  Substance and Sexual Activity  . Alcohol use: No    Alcohol/week: 0.0 oz  . Drug use: No  . Sexual activity: Yes    Partners: Male  Other Topics Concern  . Not on file  Social History Narrative  . Not on file     Current Outpatient Medications:  .  amphetamine-dextroamphetamine (ADDERALL) 10 MG tablet, Take 1 tablet (10  mg total) by mouth 3 (three) times daily. 2 in am and one in pm, Disp: 90 tablet, Rfl: 0 .  amphetamine-dextroamphetamine (ADDERALL) 10 MG tablet, Take 1 tablet (10 mg total) by mouth 3 (three) times daily. 2 in am and one in pm, Disp: 90 tablet, Rfl: 0 .  amphetamine-dextroamphetamine (ADDERALL) 10 MG tablet, Take 1-2 tablets (10-20 mg total) by mouth 2 (two) times daily. 2 in am and one in pm, Disp: 90 tablet, Rfl: 0 .  Aspirin-Acetaminophen-Caffeine (GOODY HEADACHE PO), Take by mouth., Disp: , Rfl:  .  cholecalciferol (VITAMIN D) 1000 UNITS tablet, Take 2,000 Units by mouth daily., Disp: , Rfl:  .  ibuprofen (  ADVIL,MOTRIN) 200 MG tablet, Take by mouth., Disp: , Rfl:  .  Ipratropium-Albuterol (COMBIVENT RESPIMAT) 20-100 MCG/ACT AERS respimat, Inhale 1 puff into the lungs every 6 (six) hours as needed for wheezing or shortness of breath., Disp: 12 g, Rfl: 2 .  SUMAtriptan (IMITREX) 100 MG tablet, Take 1 tablet (100 mg total) by mouth as needed for migraine. May repeat in 2 hours if headache persists or recurs., Disp: 18 tablet, Rfl: 1 .  topiramate (TOPAMAX) 100 MG tablet, TAKE 1 TABLET BY MOUTH TWO  TIMES DAILY, Disp: 180 tablet, Rfl: 1 .  traMADol (ULTRAM) 50 MG tablet, Take 1 tablet by mouth 2 (two) times daily as needed., Disp: , Rfl:  .  valACYclovir (VALTREX) 500 MG tablet, TAKE 2 TABLETS BY MOUTH TWICE DAILY FOR 10 DAYS, Disp: , Rfl:   Allergies  Allergen Reactions  . Codeine   . Pantoprazole     Flu like syptoms     ROS  Constitutional: Negative for fever , positive  weight change.  Respiratory: Positive  For a seldom  cough and shortness of breath.   Cardiovascular: Negative for chest pain or palpitations.  Gastrointestinal: Negative for abdominal pain, no bowel changes.  Musculoskeletal: Negative for gait problem or joint swelling.  Skin: Negative for rash.  Neurological: Negative for dizziness , intermittent  headache.  No other specific complaints in a complete review of  systems (except as listed in HPI above).  Objective  Vitals:   03/29/17 0751  BP: 110/80  Pulse: 77  Resp: 14  SpO2: 98%  Weight: 149 lb 6.4 oz (67.8 kg)  Height: 5\' 2"  (1.575 m)    Body mass index is 27.33 kg/m.  Physical Exam  Constitutional: Patient appears well-developed and well-nourished. No distress.  HEENT: head atraumatic, normocephalic, pupils equal and reactive to light, neck supple, throat within normal limits Cardiovascular: Normal rate, regular rhythm and normal heart sounds.  No murmur heard. No BLE edema. Pulmonary/Chest: Effort normal and breath sounds normal. No respiratory distress. Abdominal: Soft.  There is no tenderness. Psychiatric: Patient has a normal mood and affect. behavior is normal. Judgment and thought content normal.  Recent Results (from the past 2160 hour(s))  COMPLETE METABOLIC PANEL WITH GFR     Status: None   Collection Time: 03/24/17  8:33 AM  Result Value Ref Range   Glucose, Bld 89 65 - 99 mg/dL    Comment: .            Fasting reference interval .    BUN 20 7 - 25 mg/dL   Creat 1.61 0.96 - 0.45 mg/dL    Comment: For patients >38 years of age, the reference limit for Creatinine is approximately 13% higher for people identified as African-American. .    GFR, Est Non African American 72 > OR = 60 mL/min/1.71m2   GFR, Est African American 83 > OR = 60 mL/min/1.28m2   BUN/Creatinine Ratio NOT APPLICABLE 6 - 22 (calc)   Sodium 140 135 - 146 mmol/L   Potassium 3.9 3.5 - 5.3 mmol/L   Chloride 107 98 - 110 mmol/L   CO2 24 20 - 32 mmol/L   Calcium 9.2 8.6 - 10.4 mg/dL   Total Protein 7.0 6.1 - 8.1 g/dL   Albumin 4.4 3.6 - 5.1 g/dL   Globulin 2.6 1.9 - 3.7 g/dL (calc)   AG Ratio 1.7 1.0 - 2.5 (calc)   Total Bilirubin 0.4 0.2 - 1.2 mg/dL   Alkaline phosphatase (APISO) 88 33 -  130 U/L   AST 15 10 - 35 U/L   ALT 13 6 - 29 U/L  CBC with Differential/Platelet     Status: None   Collection Time: 03/24/17  8:33 AM  Result Value Ref  Range   WBC 3.8 3.8 - 10.8 Thousand/uL   RBC 4.94 3.80 - 5.10 Million/uL   Hemoglobin 13.6 11.7 - 15.5 g/dL   HCT 04.5 40.9 - 81.1 %   MCV 82.8 80.0 - 100.0 fL   MCH 27.5 27.0 - 33.0 pg   MCHC 33.3 32.0 - 36.0 g/dL   RDW 91.4 78.2 - 95.6 %   Platelets 185 140 - 400 Thousand/uL   MPV 9.7 7.5 - 12.5 fL   Neutro Abs 1,801 1,500 - 7,800 cells/uL   Lymphs Abs 1,592 850 - 3,900 cells/uL   WBC mixed population 304 200 - 950 cells/uL   Eosinophils Absolute 72 15 - 500 cells/uL   Basophils Absolute 30 0 - 200 cells/uL   Neutrophils Relative % 47.4 %   Total Lymphocyte 41.9 %   Monocytes Relative 8.0 %   Eosinophils Relative 1.9 %   Basophils Relative 0.8 %  Hemoglobin A1c     Status: None   Collection Time: 03/24/17  8:33 AM  Result Value Ref Range   Hgb A1c MFr Bld 5.4 <5.7 % of total Hgb    Comment: For the purpose of screening for the presence of diabetes: . <5.7%       Consistent with the absence of diabetes 5.7-6.4%    Consistent with increased risk for diabetes             (prediabetes) > or =6.5%  Consistent with diabetes . This assay result is consistent with a decreased risk of diabetes. . Currently, no consensus exists regarding use of hemoglobin A1c for diagnosis of diabetes in children. . According to American Diabetes Association (ADA) guidelines, hemoglobin A1c <7.0% represents optimal control in non-pregnant diabetic patients. Different metrics may apply to specific patient populations.  Standards of Medical Care in Diabetes(ADA). .    Mean Plasma Glucose 108 (calc)   eAG (mmol/L) 6.0 (calc)  Lipid panel     Status: Abnormal   Collection Time: 03/24/17  8:33 AM  Result Value Ref Range   Cholesterol 236 (H) <200 mg/dL   HDL 57 >21 mg/dL   Triglycerides 308 <657 mg/dL   LDL Cholesterol (Calc) 156 (H) mg/dL (calc)    Comment: Reference range: <100 . Desirable range <100 mg/dL for primary prevention;   <70 mg/dL for patients with CHD or diabetic patients   with > or = 2 CHD risk factors. Marland Kitchen LDL-C is now calculated using the Martin-Hopkins  calculation, which is a validated novel method providing  better accuracy than the Friedewald equation in the  estimation of LDL-C.  Horald Pollen et al. Lenox Ahr. 8469;629(52): 2061-2068  (http://education.[facility].com/faq/FAQ164)    Total CHOL/HDL Ratio 4.1 <5.0 (calc)   Non-HDL Cholesterol (Calc) 179 (H) <130 mg/dL (calc)    Comment: For patients with diabetes plus 1 major ASCVD risk  factor, treating to a non-HDL-C goal of <100 mg/dL  (LDL-C of <84 mg/dL) is considered a therapeutic  option.   Vitamin B12     Status: None   Collection Time: 03/24/17  8:33 AM  Result Value Ref Range   Vitamin B-12 580 200 - 1,100 pg/mL  VITAMIN D 25 Hydroxy (Vit-D Deficiency, Fractures)     Status: Abnormal   Collection Time: 03/24/17  8:33 AM  Result Value Ref Range   Vit D, 25-Hydroxy 24 (L) 30 - 100 ng/mL    Comment: Vitamin D Status         25-OH Vitamin D: . Deficiency:                    <20 ng/mL Insufficiency:             20 - 29 ng/mL Optimal:                 > or = 30 ng/mL . For 25-OH Vitamin D testing on patients on  D2-supplementation and patients for whom quantitation  of D2 and D3 fractions is required, the [facility](TM) 25-OH VIT D, (D2,D3), LC/MS/MS is recommended: order  code 16109 (patients >33yrs). . For more information on this test, go to: http://education.[facility].com/faq/FAQ163 (This link is being provided for  informational/educational purposes only.)   TSH     Status: Abnormal   Collection Time: 03/24/17  8:33 AM  Result Value Ref Range   TSH 5.40 (H) mIU/L    Comment:           Reference Range .           > or = 20 Years  0.40-4.50 .                Pregnancy Ranges           First trimester    0.26-2.66           Second trimester   0.55-2.73           Third trimester    0.43-2.91       PHQ2/9: Depression screen [facility] 2/9 03/29/2017 12/24/2016 09/11/2016  06/15/2016 01/15/2016  Decreased Interest 0 0 0 0 0  Down, Depressed, Hopeless 0 0 0 0 0  PHQ - 2 Score 0 0 0 0 0     Fall Risk: Fall Risk  03/29/2017 12/24/2016 09/11/2016 06/15/2016 01/15/2016  Falls in the past year? No No No No No     Assessment & Plan  1. Simple chronic bronchitis (HCC)  Continue Combivent  2. Need for immunization against influenza  - Flu Vaccine QUAD 36+ mos IM  4. Attention deficit hyperactivity disorder (ADHD), predominantly inattentive type  - amphetamine-dextroamphetamine (ADDERALL) 10 MG tablet; Take 1 tablet (10 mg total) 3 (three) times daily by mouth. 2 in am and one in pm  Dispense: 90 tablet; Refill: 0 - amphetamine-dextroamphetamine (ADDERALL) 10 MG tablet; Take 1 tablet (10 mg total) 3 (three) times daily by mouth. 2 in am and one in pm  Dispense: 90 tablet; Refill: 0 - amphetamine-dextroamphetamine (ADDERALL) 10 MG tablet; Take 1-2 tablets (10-20 mg total) 2 (two) times daily by mouth. 2 in am and one in pm  Dispense: 90 tablet; Refill: 0  5. Vitamin D deficiency   6. Major depressive disorder, single episode, in partial remission (HCC)  She is tried of paying high co-pay for Pristiq we will try switching to Cymbalta  7. Dyslipidemia  ASCVD is 1.7% in the next 10 years no need for statin therapy   8. Migraine without aura and without status migrainosus, not intractable  Doing well at this time, continue Topamax and prn Imitrex  9. Abnormal TSH  Feeling more tired than usual, gaining weight, and daughter has hypothyroidism - Thyroid Peroxidase Antibody - T3, free - T4  - levothyroxine (SYNTHROID, LEVOTHROID) 25 MCG tablet; Take 1 tablet (25 mcg total) daily before breakfast  by mouth.  Dispense: 30 tablet; Refill: 1  10. Conjunctival hemorrhage, left  No blurred vision, pain or drainage, reassurance given today

## 2017-04-01 LAB — T4: T4 TOTAL: 6.8 ug/dL (ref 5.1–11.9)

## 2017-04-01 LAB — T3, FREE: T3, Free: 3 pg/mL (ref 2.3–4.2)

## 2017-04-01 LAB — THYROID PEROXIDASE ANTIBODY: THYROID PEROXIDASE ANTIBODY: 4 [IU]/mL (ref <9)

## 2017-04-19 ENCOUNTER — Other Ambulatory Visit: Payer: Self-pay | Admitting: Family Medicine

## 2017-04-19 DIAGNOSIS — G43009 Migraine without aura, not intractable, without status migrainosus: Secondary | ICD-10-CM

## 2017-04-20 NOTE — Telephone Encounter (Signed)
Refill request for general medication: Topamax  Last office visit: 03/29/2017

## 2017-05-19 ENCOUNTER — Other Ambulatory Visit: Payer: Self-pay | Admitting: Family Medicine

## 2017-05-19 DIAGNOSIS — R7989 Other specified abnormal findings of blood chemistry: Secondary | ICD-10-CM

## 2017-05-19 NOTE — Telephone Encounter (Signed)
She needs to come in for labs - TSH order placed.  10 day supply provided.

## 2017-05-24 ENCOUNTER — Encounter: Payer: Self-pay | Admitting: Family Medicine

## 2017-05-28 ENCOUNTER — Other Ambulatory Visit: Payer: Self-pay | Admitting: Family Medicine

## 2017-05-28 ENCOUNTER — Other Ambulatory Visit: Payer: Self-pay | Admitting: Emergency Medicine

## 2017-05-28 DIAGNOSIS — R7989 Other specified abnormal findings of blood chemistry: Secondary | ICD-10-CM

## 2017-05-28 LAB — TSH: TSH: 5.03 mIU/L — ABNORMAL HIGH

## 2017-06-01 ENCOUNTER — Other Ambulatory Visit: Payer: Self-pay | Admitting: Family Medicine

## 2017-06-01 DIAGNOSIS — R7989 Other specified abnormal findings of blood chemistry: Secondary | ICD-10-CM

## 2017-06-01 MED ORDER — LEVOTHYROXINE SODIUM 25 MCG PO TABS: 25.0000 ug | ORAL_TABLET | Freq: Every day | ORAL | 0 refills | Status: DC

## 2017-06-16 ENCOUNTER — Other Ambulatory Visit: Payer: Self-pay | Admitting: Family Medicine

## 2017-06-16 DIAGNOSIS — F324 Major depressive disorder, single episode, in partial remission: Secondary | ICD-10-CM

## 2017-06-17 NOTE — Telephone Encounter (Signed)
Refill request for general medication. Duloxetine to [facility].  Last office visit:03/29/2017  Next visit: 06/30/2017

## 2017-06-30 ENCOUNTER — Encounter: Payer: Self-pay | Admitting: Family Medicine

## 2017-06-30 ENCOUNTER — Ambulatory Visit: Payer: Commercial Managed Care - HMO | Admitting: Family Medicine

## 2017-06-30 VITALS — BP 104/66 | HR 92 | Resp 14 | Ht 62.0 in | Wt 146.7 lb

## 2017-06-30 DIAGNOSIS — G43009 Migraine without aura, not intractable, without status migrainosus: Secondary | ICD-10-CM | POA: Diagnosis not present

## 2017-06-30 DIAGNOSIS — E538 Deficiency of other specified B group vitamins: Secondary | ICD-10-CM

## 2017-06-30 DIAGNOSIS — F900 Attention-deficit hyperactivity disorder, predominantly inattentive type: Secondary | ICD-10-CM | POA: Diagnosis not present

## 2017-06-30 DIAGNOSIS — E039 Hypothyroidism, unspecified: Secondary | ICD-10-CM

## 2017-06-30 DIAGNOSIS — E785 Hyperlipidemia, unspecified: Secondary | ICD-10-CM

## 2017-06-30 DIAGNOSIS — I700 Atherosclerosis of aorta: Secondary | ICD-10-CM

## 2017-06-30 DIAGNOSIS — J410 Simple chronic bronchitis: Secondary | ICD-10-CM | POA: Diagnosis not present

## 2017-06-30 DIAGNOSIS — F324 Major depressive disorder, single episode, in partial remission: Secondary | ICD-10-CM

## 2017-06-30 MED ORDER — AMPHETAMINE-DEXTROAMPHETAMINE 10 MG PO TABS: 10.0000 mg | ORAL_TABLET | Freq: Three times a day (TID) | ORAL | 0 refills | Status: DC

## 2017-06-30 MED ORDER — LEVOTHYROXINE SODIUM 25 MCG PO TABS: 25.0000 ug | ORAL_TABLET | Freq: Every day | ORAL | 0 refills | Status: DC

## 2017-06-30 MED ORDER — AMPHETAMINE-DEXTROAMPHETAMINE 10 MG PO TABS: 10.0000 mg | ORAL_TABLET | Freq: Two times a day (BID) | ORAL | 0 refills | Status: DC

## 2017-06-30 NOTE — Progress Notes (Signed)
Name: Jane Patrick   MRN: [id number]    DOB: 08/25/1960   Date:06/30/2017       Progress Note  Subjective  Chief Complaint  Chief Complaint  Patient presents with  . ADHD  . Hypothyroidism    HPI  Migraine headache: she is on Topamax for prevention, usually triggered by barometric pressure changes.had one episode last week, episodes are random  Imitrex helps with symptoms, takes about 45 minutes to resolve the symptoms and she feels groggy the following day. Symptoms are described as throbbing, usually temporal but can radiate to the entire head. It is associates with nausea and vomiting, at times associated with phonophobia and photophobia. She stopped drinking tea daily and daily  Headache has improved, only taking good powders at most two times a week , used to take it daily   Abnormal pap smear in 2012: [facility], and advised to see Dr. Mariann [place].  She had a TAH in 2004 for treatment of abnormal cervical cells. She was [facility] by [facility] Dal[facility]Be[facility]ley [facility] and last pap done in Feb 2017 was normal with positive HPV , October 2017, she missed appointment 02/2017 but will scheduled it. No vaginal bleeding or cramping.  Herpes Type II: she had a small lesion when she was seen by Dr. Dalbert GarnetBeasley and culture was positive for herpes, she was given Valtrex, no other outbreaks since, she is not on preventive therapy. Boyfriend is aware of her diagnosis. No changes.   Hematuria: [facility] and advised by them to follow up in 6 months, but did not go back, she does not want to go back now. She has a long history of hematuria and in one of the studies she has a liver lesion, but was offered multiple times to have a repeat CT scan but she continues to refuse. She still has not gone back yet. She checked the coverage with her insurance but it would be too expensive. Can't have an MRI because of metal rod on left femur.No changes.   ADD: .Working for [employer]. [place]. She states medication helps her focus. No side effects of medication . Skipping Adderal on weekends, but not able to focus if she skips during the work week. She denies tachycardia bp is normal. Denies mood changes with medication   COPD Mild: occasional SOB , no cough or  wheezing, no decrease in exercise tolerance. No longer smoking - quit about 10 years ago . She uses Combivent less often, at most 4 times weekly, worse symptoms when it is humid  B12 deficiency: found in June 2016, currently only using SL B12 daily   Major Depression in partial Remission: on Pristiq andoff AlprazolamSymptoms started when husband was diagnosed with lung cancer and died in 2010. Getting tired lately, gaining weight, lack of energy and motivation, frustrated about cost of medication. She is now on Cymbalta.   Hypothyroidism: daughter has  Hypothyroidism, TSH was elevated times two, she is now on levothyroxine, she has lost a little weight, no change in bowel movements. No hair changes, not sure if it has improved her symptoms of fatigue yet.    Patient Active Problem List   Diagnosis Date Noted  . Atherosclerosis of aorta (HCC) 09/22/2016  . Hiatal hernia 09/22/2016  . Abnormal liver CT 09/11/2016  . H/O herpes simplex type 2 infection 08/20/2015  . Hematuria 02/19/2015  . B12 deficiency 02/19/2015  .  Attention deficit hyperactivity disorder (ADHD), predominantly inattentive type 11/13/2014  . COPD, mild (HCC) 11/13/2014  . Major depression in partial remission (HCC) 11/13/2014  . History of basal cell carcinoma 11/13/2014  . Gastroesophageal reflux disease without esophagitis 11/13/2014  . History of abnormal cervical Pap smear 11/13/2014  . Migraine without aura and without status migrainosus, not intractable 11/13/2014  . Vitamin D deficiency 11/13/2014  . Liver lesion 10/04/2010    Past Surgical History:  Procedure Laterality Date  .  ABDOMINAL HYSTERECTOMY  2004   partial  . BREAST SURGERY     biopsies  . FRACTURE SURGERY     rode in femur  . POLYPECTOMY      Family History  Problem Relation Age of Onset  . Colon cancer Father   . Breast cancer Sister   . Diverticulitis Brother   . Diverticulitis Brother     Social History   Socioeconomic History  . Marital status: Married    Spouse name: Not on file  . Number of children: Not on file  . Years of education: Not on file  . Highest education level: Not on file  Social Needs  . Financial resource strain: Not on file  . Food insecurity - worry: Not on file  . Food insecurity - inability: Not on file  . Transportation needs - medical: Not on file  . Transportation needs - non-medical: Not on file  Occupational History  . Not on file  Tobacco Use  . Smoking status: Former Smoker    Packs/day: 1.00    Years: 25.00    Pack years: 25.00    Types: Cigarettes    Last attempt to quit: 05/25/2004    Years since quitting: 13.1  . Smokeless tobacco: Never Used  Substance and Sexual Activity  . Alcohol use: No    Alcohol/week: 0.0 oz  . Drug use: No  . Sexual activity: Yes    Partners: Male  Other Topics Concern  . Not on file  Social History Narrative  . Not on file     Current Outpatient Medications:  .  amphetamine-dextroamphetamine (ADDERALL) 10 MG tablet, Take 1 tablet (10 mg total) by mouth 3 (three) times daily. 2 in am and one in pm, Disp: 90 tablet, Rfl: 0 .  amphetamine-dextroamphetamine (ADDERALL) 10 MG tablet, Take 1 tablet (10 mg total) by mouth 3 (three) times daily. 2 in am and one in pm, Disp: 90 tablet, Rfl: 0 .  amphetamine-dextroamphetamine (ADDERALL) 10 MG tablet, Take 1-2 tablets (10-20 mg total) by mouth 2 (two) times daily. 2 in am and one in pm, Disp: 90 tablet, Rfl: 0 .  Aspirin-Acetaminophen-Caffeine (GOODY HEADACHE PO), Take by mouth., Disp: , Rfl:  .  cholecalciferol (VITAMIN D) 1000 UNITS tablet, Take 2,000 Units by mouth  daily., Disp: , Rfl:  .  DULoxetine (CYMBALTA) 60 MG capsule, TAKE 1 CAPSULE(60 MG) BY MOUTH DAILY, Disp: 30 capsule, Rfl: 0 .  ibuprofen (ADVIL,MOTRIN) 200 MG tablet, Take by mouth., Disp: , Rfl:  .  Ipratropium-Albuterol (COMBIVENT RESPIMAT) 20-100 MCG/ACT AERS respimat, Inhale 1 puff into the lungs every 6 (six) hours as needed for wheezing or shortness of breath., Disp: 12 g, Rfl: 2 .  levothyroxine (SYNTHROID, LEVOTHROID) 25 MCG tablet, Take 1 tablet (25 mcg total) by mouth daily before breakfast. And take one extra pill M, W and Friday, Disp: 135 tablet, Rfl: 0 .  SUMAtriptan (IMITREX) 100 MG tablet, Take 1 tablet (100 mg total) by mouth as needed for  migraine. May repeat in 2 hours if headache persists or recurs., Disp: 18 tablet, Rfl: 1 .  topiramate (TOPAMAX) 100 MG tablet, TAKE 1 TABLET BY MOUTH TWO  TIMES DAILY, Disp: 180 tablet, Rfl: 1 .  traMADol (ULTRAM) 50 MG tablet, Take 1 tablet by mouth 2 (two) times daily as needed., Disp: , Rfl:  .  valACYclovir (VALTREX) 500 MG tablet, TAKE 2 TABLETS BY MOUTH TWICE DAILY FOR 10 DAYS, Disp: , Rfl:   Allergies  Allergen Reactions  . Codeine   . Pantoprazole     Flu like syptoms     ROS  Constitutional: Negative for fever or weight change.  Respiratory: Negative for cough and shortness of breath.   Cardiovascular: Negative for chest pain or palpitations.  Gastrointestinal: Negative for abdominal pain, no bowel changes.  Musculoskeletal: Negative for gait problem or joint swelling.  Skin: Negative for rash.  Neurological: Negative for dizziness or headache.  No other specific complaints in a complete review of systems (except as listed in HPI above).  Objective  Vitals:   06/30/17 0750  BP: 104/66  Pulse: 92  Resp: 14  SpO2: 97%  Weight: 146 lb 11.2 oz (66.5 kg)  Height: 5\' 2"  (1.575 m)    Body mass index is 26.83 kg/m.  Physical Exam  Constitutional: Patient appears well-developed and well-nourished. No distress.   HEENT: head atraumatic, normocephalic, pupils equal and reactive to light,  neck supple, throat within normal limits Cardiovascular: Normal rate, regular rhythm and normal heart sounds.  No murmur heard. No BLE edema. Pulmonary/Chest: Effort normal and breath sounds normal. No respiratory distress. Abdominal: Soft.  There is no tenderness. Psychiatric: Patient has a normal mood and affect. behavior is normal. Judgment and thought content normal.   Recent Results (from the past 2160 hour(s))  TSH     Status: Abnormal   Collection Time: 05/28/17  8:09 AM  Result Value Ref Range   TSH 5.03 (H) mIU/L    Comment:           Reference Range .           > or = 20 Years  0.40-4.50 .                Pregnancy Ranges           First trimester    0.26-2.66           Second trimester   0.55-2.73           Third trimester    0.43-2.91       PHQ2/9: Depression screen [facility] 2/9 03/29/2017 03/29/2017 12/24/2016 09/11/2016 06/15/2016  Decreased Interest 0 0 0 0 0  Down, Depressed, Hopeless 0 0 0 0 0  PHQ - 2 Score 0 0 0 0 0  Altered sleeping 1 - - - -  Tired, decreased energy 3 - - - -  Change in appetite 3 - - - -  Feeling bad or failure about yourself  0 - - - -  Trouble concentrating 0 - - - -  Moving slowly or fidgety/restless 0 - - - -  Suicidal thoughts 0 - - - -  PHQ-9 Score 7 - - - -  Difficult doing work/chores Not difficult at all - - - -     Fall Risk: Fall Risk  06/30/2017 03/29/2017 12/24/2016 09/11/2016 06/15/2016  Falls in the past year? No No No No No      Functional Status Survey: Is the patient deaf  or have difficulty hearing?: No Does the patient have difficulty seeing, even when wearing glasses/contacts?: No Does the patient have difficulty concentrating, remembering, or making decisions?: No Does the patient have difficulty walking or climbing stairs?: No Does the patient have difficulty dressing or bathing?: No Does the patient have difficulty doing errands alone such as  visiting a doctor's office or shopping?: No   Assessment & Plan  1. Simple chronic bronchitis (HCC)  She is doing well  2. Attention deficit hyperactivity disorder (ADHD), predominantly inattentive type  - amphetamine-dextroamphetamine (ADDERALL) 10 MG tablet; Take 1 tablet (10 mg total) by mouth 3 (three) times daily. 2 in am and one in pm  Dispense: 90 tablet; Refill: 0 - amphetamine-dextroamphetamine (ADDERALL) 10 MG tablet; Take 1 tablet (10 mg total) by mouth 3 (three) times daily. 2 in am and one in pm  Dispense: 90 tablet; Refill: 0 - amphetamine-dextroamphetamine (ADDERALL) 10 MG tablet; Take 1-2 tablets (10-20 mg total) by mouth 2 (two) times daily. 2 in am and one in pm  Dispense: 90 tablet; Refill: 0  3. Major depressive disorder, single episode, in partial remission (HCC)  She did not want to re-do her depression screen, she is doing well on medication   4. B12 deficiency  Continue supplementation   5. Hypothyroidism in adult  Recheck TSH  - levothyroxine (SYNTHROID, LEVOTHROID) 25 MCG tablet; Take 1 tablet (25 mcg total) by mouth daily before breakfast. And take one extra pill M, W and Friday  Dispense: 135 tablet; Refill: 0  6. Atherosclerosis of aorta (HCC)  Discussed aspirin and statin therapy, she wants to hold off on statin therapy at this time  7. Dyslipidemia  Last LDL above 130 and has atherosclerosis of aorta  8. Migraine without aura and without status migrainosus, not intractable  Stable at this time

## 2017-06-30 NOTE — Addendum Note (Signed)
Addended by: Cynda FamiliaJOHNSON, Lenard Kampf L on: 06/30/2017 08:19 AM   Modules accepted: Orders

## 2017-07-27 ENCOUNTER — Encounter: Payer: Self-pay | Admitting: Family Medicine

## 2017-07-29 ENCOUNTER — Other Ambulatory Visit: Payer: Self-pay | Admitting: Family Medicine

## 2017-07-29 DIAGNOSIS — F324 Major depressive disorder, single episode, in partial remission: Secondary | ICD-10-CM

## 2017-08-13 LAB — TSH: TSH: 1.16 m[IU]/L

## 2017-08-15 ENCOUNTER — Encounter: Payer: Self-pay | Admitting: Family Medicine

## 2017-08-15 DIAGNOSIS — E039 Hypothyroidism, unspecified: Secondary | ICD-10-CM | POA: Insufficient documentation

## 2017-09-03 LAB — HM MAMMOGRAPHY: HM Mammogram: NORMAL (ref 0–4)

## 2017-09-13 ENCOUNTER — Encounter: Payer: Self-pay | Admitting: Family Medicine

## 2017-09-23 ENCOUNTER — Other Ambulatory Visit: Payer: Self-pay | Admitting: Family Medicine

## 2017-09-23 DIAGNOSIS — G43009 Migraine without aura, not intractable, without status migrainosus: Secondary | ICD-10-CM

## 2017-09-25 ENCOUNTER — Other Ambulatory Visit: Payer: Self-pay | Admitting: Family Medicine

## 2017-09-25 DIAGNOSIS — E039 Hypothyroidism, unspecified: Secondary | ICD-10-CM

## 2017-09-30 ENCOUNTER — Ambulatory Visit: Payer: Commercial Managed Care - HMO | Admitting: Family Medicine

## 2017-10-15 ENCOUNTER — Other Ambulatory Visit: Payer: Self-pay | Admitting: Family Medicine

## 2017-10-15 DIAGNOSIS — F324 Major depressive disorder, single episode, in partial remission: Secondary | ICD-10-CM

## 2017-10-15 NOTE — Telephone Encounter (Signed)
Refill request for general medication. Cymbalta to [facility]  Last office visit:06/30/2017  No follow-ups on file.

## 2017-10-17 ENCOUNTER — Other Ambulatory Visit: Payer: Self-pay | Admitting: Family Medicine

## 2017-10-17 DIAGNOSIS — F324 Major depressive disorder, single episode, in partial remission: Secondary | ICD-10-CM

## 2017-10-17 MED ORDER — DULOXETINE HCL 60 MG PO CPEP: 60.0000 mg | ORAL_CAPSULE | Freq: Every day | ORAL | 0 refills | Status: DC

## 2017-10-17 NOTE — Telephone Encounter (Signed)
She needs follow up, sending one refill 

## 2017-10-20 NOTE — Telephone Encounter (Signed)
Called pt. Pt states she has to call back to schedule. Pt got layed off from her job and is seeking employment to get insurance.

## 2017-11-19 ENCOUNTER — Other Ambulatory Visit: Payer: Self-pay | Admitting: Family Medicine

## 2017-11-19 DIAGNOSIS — F324 Major depressive disorder, single episode, in partial remission: Secondary | ICD-10-CM

## 2017-12-10 ENCOUNTER — Encounter: Payer: Self-pay | Admitting: Family Medicine

## 2017-12-10 ENCOUNTER — Ambulatory Visit: Payer: Commercial Managed Care - HMO | Admitting: Family Medicine

## 2017-12-10 ENCOUNTER — Other Ambulatory Visit: Payer: Self-pay | Admitting: Family Medicine

## 2017-12-10 VITALS — BP 128/72 | HR 92 | Temp 98.3°F | Resp 14 | Ht 62.0 in | Wt 148.0 lb

## 2017-12-10 DIAGNOSIS — G43009 Migraine without aura, not intractable, without status migrainosus: Secondary | ICD-10-CM

## 2017-12-10 DIAGNOSIS — F900 Attention-deficit hyperactivity disorder, predominantly inattentive type: Secondary | ICD-10-CM | POA: Diagnosis not present

## 2017-12-10 DIAGNOSIS — E039 Hypothyroidism, unspecified: Secondary | ICD-10-CM

## 2017-12-10 DIAGNOSIS — J410 Simple chronic bronchitis: Secondary | ICD-10-CM

## 2017-12-10 DIAGNOSIS — E785 Hyperlipidemia, unspecified: Secondary | ICD-10-CM

## 2017-12-10 DIAGNOSIS — F331 Major depressive disorder, recurrent, moderate: Secondary | ICD-10-CM

## 2017-12-10 DIAGNOSIS — I700 Atherosclerosis of aorta: Secondary | ICD-10-CM

## 2017-12-10 DIAGNOSIS — F324 Major depressive disorder, single episode, in partial remission: Secondary | ICD-10-CM

## 2017-12-10 MED ORDER — DULOXETINE HCL 60 MG PO CPEP: 60.0000 mg | ORAL_CAPSULE | Freq: Every day | ORAL | 1 refills | Status: DC

## 2017-12-10 MED ORDER — LEVOTHYROXINE SODIUM 25 MCG PO TABS: ORAL_TABLET | ORAL | 0 refills | Status: DC

## 2017-12-10 MED ORDER — SUMATRIPTAN SUCCINATE 100 MG PO TABS: 100.0000 mg | ORAL_TABLET | ORAL | 1 refills | Status: DC | PRN

## 2017-12-10 MED ORDER — AMPHETAMINE-DEXTROAMPHETAMINE 10 MG PO TABS: 10.0000 mg | ORAL_TABLET | Freq: Two times a day (BID) | ORAL | 0 refills | Status: DC

## 2017-12-10 NOTE — Telephone Encounter (Signed)
Refill request for general medication: Cymbalta 60 mg  Last office visit: 06/30/2017   Last physical exam: None indicated  No follow-ups on file.

## 2017-12-10 NOTE — Progress Notes (Addendum)
Name: Jane Patrick   MRN: [id number]    DOB: 03/25/1961   Date:12/10/2017       Progress Note  Subjective  Chief Complaint  Chief Complaint  Patient presents with  . Medication Refill    levothyroxine, Duloxetine, adderall    HPI  Migraine headache: she is on Topamax for prevention, usually triggered by barometric pressure changes.Imitrex helps with symptoms, takes about 45 minutes to resolve the symptoms and she feels groggy the following day. Symptoms are described as throbbing, usually temporal but can radiate to the entire head. It is associates with nausea and vomiting, at times associated with phonophobia and photophobia. She states episodes are very sporadic at most once a month.   Abnormal pap smear in 09-Oct-2010: she was referred to [facility], and advised to see [name].  She had a TAH in 2002-10-09 for treatment of abnormal cervical cells. She was seen by [name] at [facility] and last pap done in Feb 2017 was normal with positive HPV , October 2017, she missed appointment 02/2017 , she lost her job and has to re-schedule that she has insurance.. No vaginal bleeding or cramping.  Herpes Type II: she had a small lesion when she was seen by [name] and culture was positive for herpes, she was given Valtrex, no other outbreaks since, she is not on preventive therapy. Boyfriend is aware of her diagnosis. Unchanged   Hematuria: seen at [facility] in 2010/10/09 and advised by them to follow up in 6 months, but did not go back, she does not want to go back now. She has a long history of hematuria and in one of the studies she has a liver lesion, but was offered multiple times to have a repeat CT scan but she continues to refuse. She still has not gone back yet. She has a new insurance since she lost her job, but still does not want to go back.   ADD: .she lost her job May 17, 2019but is waiting to see if she can go back to her previous job. . She states medication helps her focus, and while out of  work she has not been taking it daily, but depression has been worse and is feeling very tired and lack of motivation. No side effects of medication   COPD Mild: occasional SOB , no coughorwheezing, no decrease in exercise tolerance. No longer smoking - quit about 10 years ago. She uses Combivent less often, at most 4 times weekly or less. Humidity makes symptoms worse   B12 deficiency: found in June 2016,currently only using SL B12 daily   Major Depression in partial Remission: on Pristiq andoff AlprazolamSymptoms started when husband was diagnosed with lung cancer and died in 2008-10-08.Still taking duloxetine, no side effects, however phq 9 is higher secondary to recently losing her job. All remote employee lost their positions. She denies suicidal thoughts or ideation   Hypothyroidism: daughter has  Hypothyroidism, TSH was elevated times two, she is now on levothyroxine, last TSH was at goal    Patient Active Problem List   Diagnosis Date Noted  . Adult hypothyroidism 08/15/2017  . Atherosclerosis of aorta (HCC) 09/22/2016  . Hiatal hernia 09/22/2016  . Abnormal liver CT 09/11/2016  . H/O herpes simplex type 2 infection 08/20/2015  . Hematuria 02/19/2015  . B12 deficiency 02/19/2015  . Attention deficit hyperactivity disorder (ADHD), predominantly inattentive type 11/13/2014  . COPD, mild (HCC) 11/13/2014  . Major depression in partial remission (HCC) 11/13/2014  .  History of basal cell carcinoma 11/13/2014  . Gastroesophageal reflux disease without esophagitis 11/13/2014  . History of abnormal cervical Pap smear 11/13/2014  . Migraine without aura and without status migrainosus, not intractable 11/13/2014  . Vitamin D deficiency 11/13/2014  . Liver lesion 10/04/2010    Past Surgical History:  Procedure Laterality Date  . ABDOMINAL HYSTERECTOMY  2004   partial  . BREAST SURGERY     biopsies  . FRACTURE SURGERY     rode in femur  . POLYPECTOMY      Family History   Problem Relation Age of Onset  . Colon cancer Father   . Breast cancer Sister   . Diverticulitis Brother   . Diverticulitis Brother     Social History   Socioeconomic History  . Marital status: Widowed    Spouse name: Not on file  . Number of children: 2  . Years of education: Not on file  . Highest education level: Associate degree: occupational, Scientist, product/process development, or vocational program  Occupational History  . Not on file  Social Needs  . Financial resource strain: Not hard at all  . Food insecurity:    Worry: Never true    Inability: Never true  . Transportation needs:    Medical: No    Non-medical: No  Tobacco Use  . Smoking status: Former Smoker    Packs/day: 1.00    Years: 25.00    Pack years: 25.00    Types: Cigarettes    Last attempt to quit: 05/25/2004    Years since quitting: 13.5  . Smokeless tobacco: Never Used  Substance and Sexual Activity  . Alcohol use: No    Alcohol/week: 0.0 oz  . Drug use: No  . Sexual activity: Yes    Partners: Male  Lifestyle  . Physical activity:    Days per week: 7 days    Minutes per session: 30 min  . Stress: Not at all  Relationships  . Social connections:    Talks on phone: More than three times a week    Gets together: More than three times a week    Attends religious service: Never    Active member of club or organization: Yes    Attends meetings of clubs or organizations: More than 4 times per year    Relationship status: Widowed  . Intimate partner violence:    Fear of current or ex partner: No    Emotionally abused: No    Physically abused: No    Forced sexual activity: No  Other Topics Concern  . Not on file  Social History Narrative  . Not on file     Current Outpatient Medications:  .  amphetamine-dextroamphetamine (ADDERALL) 10 MG tablet, Take 1-2 tablets (10-20 mg total) by mouth 2 (two) times daily with a meal. 2 in am and one in pm, Disp: 90 tablet, Rfl: 0 .  amphetamine-dextroamphetamine (ADDERALL) 10  MG tablet, Take 1-2 tablets (10-20 mg total) by mouth 2 (two) times daily with a meal. 2 in am and one in pm, Disp: 90 tablet, Rfl: 0 .  amphetamine-dextroamphetamine (ADDERALL) 10 MG tablet, Take 1-2 tablets (10-20 mg total) by mouth 2 (two) times daily. Fill 02/08/2018, Disp: 90 tablet, Rfl: 0 .  cholecalciferol (VITAMIN D) 1000 UNITS tablet, Take 2,000 Units by mouth daily., Disp: , Rfl:  .  DULoxetine (CYMBALTA) 60 MG capsule, Take 1 capsule (60 mg total) by mouth daily., Disp: 90 capsule, Rfl: 1 .  Ipratropium-Albuterol (COMBIVENT RESPIMAT) 20-100 MCG/ACT  AERS respimat, Inhale 1 puff into the lungs every 6 (six) hours as needed for wheezing or shortness of breath., Disp: 12 g, Rfl: 2 .  levothyroxine (SYNTHROID, LEVOTHROID) 25 MCG tablet, TAKE 1 TABLET BY MOUTH DAILY BEFORE BREAKFAST AND 1 EXTRA TABLET ON MONDAY, WEDNESDAY AND FRIDAY, Disp: 135 tablet, Rfl: 0 .  SUMAtriptan (IMITREX) 100 MG tablet, Take 1 tablet (100 mg total) by mouth as needed for migraine. May repeat in 2 hours if headache persists or recurs., Disp: 18 tablet, Rfl: 1 .  topiramate (TOPAMAX) 100 MG tablet, TAKE 1 TABLET BY MOUTH TWO  TIMES DAILY, Disp: 180 tablet, Rfl: 1 .  traMADol (ULTRAM) 50 MG tablet, Take 1 tablet by mouth 2 (two) times daily as needed., Disp: , Rfl:  .  valACYclovir (VALTREX) 500 MG tablet, TAKE 2 TABLETS BY MOUTH TWICE DAILY FOR 10 DAYS, Disp: , Rfl:  .  ibuprofen (ADVIL,MOTRIN) 200 MG tablet, Take by mouth., Disp: , Rfl:   Allergies  Allergen Reactions  . Codeine   . Pantoprazole     Flu like syptoms     ROS  Constitutional: Negative for fever or weight change.  Respiratory: Negative for cough and shortness of breath.   Cardiovascular: Negative for chest pain or palpitations.  Gastrointestinal: Negative for abdominal pain, no bowel changes.  Musculoskeletal: Negative for gait problem or joint swelling.  Skin: Negative for rash.  Neurological: Negative for dizziness or headache.  No other  specific complaints in a complete review of systems (except as listed in HPI above).  Objective  Vitals:   12/10/17 1508  BP: 128/72  Pulse: 92  Resp: 14  Temp: 98.3 F (36.8 C)  TempSrc: Oral  SpO2: 97%  Weight: 148 lb (67.1 kg)  Height: 5\' 2"  (1.575 m)    Body mass index is 27.07 kg/m.  Physical Exam  Constitutional: Patient appears well-developed and well-nourished. Overweight.  No distress.  HEENT: head atraumatic, normocephalic, pupils equal and reactive to light,  neck supple, throat within normal limits Cardiovascular: Normal rate, regular rhythm and normal heart sounds.  No murmur heard. No BLE edema. Pulmonary/Chest: Effort normal and breath sounds normal. No respiratory distress. Abdominal: Soft.  There is no tenderness. Psychiatric: Patient has a normal mood and affect. behavior is normal. Judgment and thought content normal.   PHQ2/9: Depression screen [facility] 2/9 12/10/2017 03/29/2017 03/29/2017 12/24/2016 09/11/2016  Decreased Interest 0 0 0 0 0  Down, Depressed, Hopeless 3 0 0 0 0  PHQ - 2 Score 3 0 0 0 0  Altered sleeping 3 1 - - -  Tired, decreased energy 3 3 - - -  Change in appetite 0 3 - - -  Feeling bad or failure about yourself  0 0 - - -  Trouble concentrating 0 0 - - -  Moving slowly or fidgety/restless 0 0 - - -  Suicidal thoughts 0 0 - - -  PHQ-9 Score 9 7 - - -  Difficult doing work/chores Very difficult Not difficult at all - - -     Fall Risk: Fall Risk  12/10/2017 06/30/2017 03/29/2017 12/24/2016 09/11/2016  Falls in the past year? No No No No No     Functional Status Survey: Is the patient deaf or have difficulty hearing?: No Does the patient have difficulty seeing, even when wearing glasses/contacts?: No Does the patient have difficulty concentrating, remembering, or making decisions?: No Does the patient have difficulty walking or climbing stairs?: No Does the patient have difficulty dressing  or bathing?: No Does the patient have difficulty  doing errands alone such as visiting a doctor's office or shopping?: No    Assessment & Plan  1. Major depressive disorder,moderate recurrent (HCC)  - DULoxetine (CYMBALTA) 60 MG capsule; Take 1 capsule (60 mg total) by mouth daily.  Dispense: 90 capsule; Refill: 1  2. Simple chronic bronchitis (HCC)  -stable at this time  3. Attention deficit hyperactivity disorder (ADHD), predominantly inattentive type  - amphetamine-dextroamphetamine (ADDERALL) 10 MG tablet; Take 1-2 tablets (10-20 mg total) by mouth 2 (two) times daily with a meal. 2 in am and one in pm  Dispense: 90 tablet; Refill: 0 - amphetamine-dextroamphetamine (ADDERALL) 10 MG tablet; Take 1-2 tablets (10-20 mg total) by mouth 2 (two) times daily with a meal. 2 in am and one in pm  Dispense: 90 tablet; Refill: 0 - amphetamine-dextroamphetamine (ADDERALL) 10 MG tablet; Take 1-2 tablets (10-20 mg total) by mouth 2 (two) times daily. Fill 02/08/2018  Dispense: 90 tablet; Refill: 0  4. Hypothyroidism in adult  - levothyroxine (SYNTHROID, LEVOTHROID) 25 MCG tablet; TAKE 1 TABLET BY MOUTH DAILY BEFORE BREAKFAST AND 1 EXTRA TABLET ON MONDAY, WEDNESDAY AND FRIDAY  Dispense: 135 tablet; Refill: 0  5. Atherosclerosis of aorta (HCC)  Discussed statin therapy again, but she would like to hold off   6. Dyslipidemia   7. Migraine without aura and without status migrainosus, not intractable  - SUMAtriptan (IMITREX) 100 MG tablet; Take 1 tablet (100 mg total) by mouth as needed for migraine. May repeat in 2 hours if headache persists or recurs.  Dispense: 18 tablet; Refill: 1

## 2017-12-28 ENCOUNTER — Other Ambulatory Visit: Payer: Self-pay | Admitting: Family Medicine

## 2017-12-28 DIAGNOSIS — E039 Hypothyroidism, unspecified: Secondary | ICD-10-CM

## 2018-03-23 ENCOUNTER — Ambulatory Visit: Payer: BLUE CROSS/BLUE SHIELD | Admitting: Family Medicine

## 2018-03-23 ENCOUNTER — Encounter: Payer: Self-pay | Admitting: Family Medicine

## 2018-03-23 VITALS — BP 132/90 | HR 88 | Temp 98.1°F | Resp 16 | Ht 62.0 in | Wt 149.1 lb

## 2018-03-23 DIAGNOSIS — J449 Chronic obstructive pulmonary disease, unspecified: Secondary | ICD-10-CM | POA: Diagnosis not present

## 2018-03-23 DIAGNOSIS — E538 Deficiency of other specified B group vitamins: Secondary | ICD-10-CM | POA: Diagnosis not present

## 2018-03-23 DIAGNOSIS — F324 Major depressive disorder, single episode, in partial remission: Secondary | ICD-10-CM

## 2018-03-23 DIAGNOSIS — E039 Hypothyroidism, unspecified: Secondary | ICD-10-CM

## 2018-03-23 DIAGNOSIS — Z23 Encounter for immunization: Secondary | ICD-10-CM

## 2018-03-23 DIAGNOSIS — F900 Attention-deficit hyperactivity disorder, predominantly inattentive type: Secondary | ICD-10-CM | POA: Diagnosis not present

## 2018-03-23 DIAGNOSIS — G43009 Migraine without aura, not intractable, without status migrainosus: Secondary | ICD-10-CM

## 2018-03-23 MED ORDER — AMPHETAMINE-DEXTROAMPHETAMINE 20 MG PO TABS: 10.0000 mg | ORAL_TABLET | Freq: Two times a day (BID) | ORAL | 0 refills | Status: DC

## 2018-03-23 MED ORDER — AMPHETAMINE-DEXTROAMPHETAMINE 20 MG PO TABS: 10.0000 mg | ORAL_TABLET | Freq: Every day | ORAL | 0 refills | Status: DC

## 2018-03-23 NOTE — Progress Notes (Signed)
Name: Jane Patrick   MRN: [id number]    DOB: January 24, 1961   Date:03/23/2018       Progress Note  Subjective  Chief Complaint  Chief Complaint  Patient presents with  . Follow-up    3 mth f/u   . Migraine  . Depression  . COPD  . ADHD  . Dyslipidemia  . Medication Refill  . Immunizations    flu shot    HPI  Migraine headache: she is on Topamax for prevention, she states skipped some doses recently, usually triggered by barometric pressure changes.Imitrex helps with symptoms, takes about 45 minutes to resolve the symptoms and she feels groggy the following day. Symptoms are described as throbbing, usually temporal but can radiate to the entire head. It is associates with nausea and vomiting, at times associated with phonophobia and photophobia. She states episodes are very sporadic , only had one episode in the past 3 months.   Abnormal pap smear in 10/25/2010: she was referred to [facility], and advised to see [name].  She had a TAH in October 25, 2002 for treatment of abnormal cervical cells. She was seen by [name] at [facility] and last pap done in Feb 2017 was normal with positive HPV , October 2017, she missed appointment 02/2017 , she lost her job and has to re-schedule that she has insurance.No vaginal bleeding or cramping. She has a high deductible now, advised to go back no later than Feb 2020  Herpes Type II: she had a small lesion when she was seen by [name] and culture was positive for herpes, she was given Valtrex, no other outbreaks since, she is not on preventive therapy. Boyfriend is aware of her diagnosis. No recent outbreaks   Hematuria: seen at [facility] in 10/25/2010 and advised by them to follow up in 6 months, but did not go back, she does not want to go back now. She has a long history of hematuria and in one of the studies she has a liver lesion, but was offered multiple times to have a repeat CT scan but she continues to refuse. She still has not gone back yet. She has a new  insurance since she lost her job, but still does not want to go back. Unchanged   ADD: she lost her job June 02, 2019and has a phone interview this afternoon from [place]. She states medication helps her focus, and while out of work she has not been taking it daily. She states when off medication she notices that her mood goes down and feels tired all the time.   COPD Mild: occasional SOB , no coughorwheezing, no decrease in exercise tolerance. No longer smoking - quit about 11 years ago. She uses Combivent less often, at most 4 times weekly or less.   B12 deficiency: found in June 2016,currently only using SL B12 daily, we will recheck labs next year, she has a high deductible with her current insurance  Major Depression in partial Remission: on Pristiq andoff AlprazolamSymptoms started when husband was diagnosed with lung cancer and died in 2008/10/24.Still taking duloxetine, no side effects, however phq 9 has improved since last visit.   Hypothyroidism: daughterhasHypothyroidism, TSH was elevated times two, she is now on levothyroxine, last TSH was at goal, but cannot afford medication today  Patient Active Problem List   Diagnosis Date Noted  . Adult hypothyroidism 08/15/2017  . Atherosclerosis of aorta (HCC) 09/22/2016  . Hiatal hernia 09/22/2016  . Abnormal liver CT 09/11/2016  . H/O  herpes simplex type 2 infection 08/20/2015  . Hematuria 02/19/2015  . B12 deficiency 02/19/2015  . Attention deficit hyperactivity disorder (ADHD), predominantly inattentive type 11/13/2014  . COPD, mild (HCC) 11/13/2014  . Major depression in partial remission (HCC) 11/13/2014  . History of basal cell carcinoma 11/13/2014  . Gastroesophageal reflux disease without esophagitis 11/13/2014  . History of abnormal cervical Pap smear 11/13/2014  . Migraine without aura and without status migrainosus, not intractable 11/13/2014  . Vitamin D deficiency 11/13/2014  . Liver lesion 10/04/2010    Past  Surgical History:  Procedure Laterality Date  . ABDOMINAL HYSTERECTOMY  2004   partial  . BREAST SURGERY     biopsies  . FRACTURE SURGERY     rode in femur  . POLYPECTOMY      Family History  Problem Relation Age of Onset  . Colon cancer Father   . Breast cancer Sister   . Diverticulitis Brother   . Diverticulitis Brother     Social History   Socioeconomic History  . Marital status: Widowed    Spouse name: Not on file  . Number of children: 2  . Years of education: Not on file  . Highest education level: Associate degree: occupational, Scientist, product/process development, or vocational program  Occupational History  . Not on file  Social Needs  . Financial resource strain: Not hard at all  . Food insecurity:    Worry: Never true    Inability: Never true  . Transportation needs:    Medical: No    Non-medical: No  Tobacco Use  . Smoking status: Former Smoker    Packs/day: 1.00    Years: 25.00    Pack years: 25.00    Types: Cigarettes    Last attempt to quit: 05/25/2004    Years since quitting: 13.8  . Smokeless tobacco: Never Used  Substance and Sexual Activity  . Alcohol use: No    Alcohol/week: 0.0 standard drinks  . Drug use: No  . Sexual activity: Yes    Partners: Male  Lifestyle  . Physical activity:    Days per week: 7 days    Minutes per session: 30 min  . Stress: Not at all  Relationships  . Social connections:    Talks on phone: More than three times a week    Gets together: More than three times a week    Attends religious service: Never    Active member of club or organization: Yes    Attends meetings of clubs or organizations: More than 4 times per year    Relationship status: Widowed  . Intimate partner violence:    Fear of current or ex partner: No    Emotionally abused: No    Physically abused: No    Forced sexual activity: No  Other Topics Concern  . Not on file  Social History Narrative   Patient has been laid off since May but has an interview today.      Current Outpatient Medications:  .  cholecalciferol (VITAMIN D) 1000 UNITS tablet, Take 2,000 Units by mouth daily., Disp: , Rfl:  .  DULoxetine (CYMBALTA) 60 MG capsule, Take 1 capsule (60 mg total) by mouth daily., Disp: 90 capsule, Rfl: 1 .  ibuprofen (ADVIL,MOTRIN) 200 MG tablet, Take by mouth., Disp: , Rfl:  .  Ipratropium-Albuterol (COMBIVENT RESPIMAT) 20-100 MCG/ACT AERS respimat, Inhale 1 puff into the lungs every 6 (six) hours as needed for wheezing or shortness of breath., Disp: 12 g, Rfl: 2 .  levothyroxine (SYNTHROID, LEVOTHROID) 25 MCG tablet, TAKE 1 TABLET BY MOUTH DAILY BEFORE BREAKFAST AND 1 EXTRA TABLET ON MONDAY, WEDNESDAY AND FRIDAY, Disp: 135 tablet, Rfl: 0 .  SUMAtriptan (IMITREX) 100 MG tablet, Take 1 tablet (100 mg total) by mouth as needed for migraine. May repeat in 2 hours if headache persists or recurs., Disp: 18 tablet, Rfl: 1 .  topiramate (TOPAMAX) 100 MG tablet, TAKE 1 TABLET BY MOUTH TWO  TIMES DAILY, Disp: 180 tablet, Rfl: 1 .  traMADol (ULTRAM) 50 MG tablet, Take 1 tablet by mouth 2 (two) times daily as needed., Disp: , Rfl:  .  valACYclovir (VALTREX) 500 MG tablet, TAKE 2 TABLETS BY MOUTH TWICE DAILY FOR 10 DAYS, Disp: , Rfl:  .  amphetamine-dextroamphetamine (ADDERALL) 20 MG tablet, Take 0.5-1 tablets (10-20 mg total) by mouth daily. One in am and half in pm, Disp: 45 tablet, Rfl: 0 .  amphetamine-dextroamphetamine (ADDERALL) 20 MG tablet, Take 0.5-1 tablets (10-20 mg total) by mouth daily. One in am and half in pm, Disp: 45 tablet, Rfl: 0 .  amphetamine-dextroamphetamine (ADDERALL) 20 MG tablet, Take 0.5-1 tablets (10-20 mg total) by mouth 2 (two) times daily. One in am and half in pm, Disp: 45 tablet, Rfl: 0  Allergies  Allergen Reactions  . Codeine   . Pantoprazole     Flu like syptoms    I personally reviewed active problem list, medication list, allergies, family history, social history with the patient/caregiver today.   ROS  Constitutional:  Negative for fever or weight change.  Respiratory: Negative for cough and shortness of breath.   Cardiovascular: Negative for chest pain or palpitations.  Gastrointestinal: Negative for abdominal pain, no bowel changes.  Musculoskeletal: Negative for gait problem or joint swelling.  Skin: Negative for rash.  Neurological: Negative for dizziness, positive for intermittent  headache.  No other specific complaints in a complete review of systems (except as listed in HPI above).  Objective  Vitals:   03/23/18 0755  BP: 132/90  Pulse: 88  Resp: 16  Temp: 98.1 F (36.7 C)  TempSrc: Oral  SpO2: 99%  Weight: 149 lb 1.6 oz (67.6 kg)  Height: 5\' 2"  (1.575 m)    Body mass index is 27.27 kg/m.  Physical Exam  Constitutional: Patient appears well-developed and well-nourished. Overweight.  No distress.  HEENT: head atraumatic, normocephalic, pupils equal and reactive to light,  neck supple, throat within normal limits Cardiovascular: Normal rate, regular rhythm and normal heart sounds.  No murmur heard. No BLE edema. Pulmonary/Chest: Effort normal and breath sounds normal. No respiratory distress. Abdominal: Soft.  There is no tenderness. Psychiatric: Patient has a normal mood and affect. behavior is normal. Judgment and thought content normal.  PHQ2/9: Depression screen [facility] 2/9 03/23/2018 12/10/2017 03/29/2017 03/29/2017 12/24/2016  Decreased Interest 0 0 0 0 0  Down, Depressed, Hopeless 1 3 0 0 0  PHQ - 2 Score 1 3 0 0 0  Altered sleeping 1 3 1  - -  Tired, decreased energy 3 3 3  - -  Change in appetite 0 0 3 - -  Feeling bad or failure about yourself  0 0 0 - -  Trouble concentrating 1 0 0 - -  Moving slowly or fidgety/restless 0 0 0 - -  Suicidal thoughts 0 0 0 - -  PHQ-9 Score 6 9 7  - -  Difficult doing work/chores Not difficult at all Very difficult Not difficult at all - -    Fall Risk: Fall Risk  03/23/2018 12/10/2017 06/30/2017 03/29/2017 12/24/2016  Falls in the past year? No No  No No No    Functional Status Survey: Is the patient deaf or have difficulty hearing?: No Does the patient have difficulty seeing, even when wearing glasses/contacts?: No Does the patient have difficulty concentrating, remembering, or making decisions?: No Does the patient have difficulty walking or climbing stairs?: No Does the patient have difficulty dressing or bathing?: No Does the patient have difficulty doing errands alone such as visiting a doctor's office or shopping?: No    Assessment & Plan   1. COPD, mild (HCC)  stable  2. Need for influenza vaccination  - Flu Vaccine QUAD 6+ mos PF IM (Fluarix Quad PF)  3. B12 deficiency  Taking otc supplementation   4. Attention deficit hyperactivity disorder (ADHD), predominantly inattentive type  Changing to 20 mg to decrease cost  - amphetamine-dextroamphetamine (ADDERALL) 20 MG tablet; Take 0.5-1 tablets (10-20 mg total) by mouth daily. One in am and half in pm  Dispense: 45 tablet; Refill: 0 - amphetamine-dextroamphetamine (ADDERALL) 20 MG tablet; Take 0.5-1 tablets (10-20 mg total) by mouth daily. One in am and half in pm  Dispense: 45 tablet; Refill: 0 - amphetamine-dextroamphetamine (ADDERALL) 20 MG tablet; Take 0.5-1 tablets (10-20 mg total) by mouth 2 (two) times daily. One in am and half in pm  Dispense: 45 tablet; Refill: 0  5. Migraine without aura and without status migrainosus, not intractable  She states episodes not as frequent, one severe episode in the past 3 months  6. Major depressive disorder, single episode, in partial remission (HCC)  Stable   7. Hypothyroidism in adult  Taking medication and not problems, she cannot afford labs today

## 2018-05-08 ENCOUNTER — Telehealth: Payer: Self-pay | Admitting: Family

## 2018-05-08 DIAGNOSIS — R0602 Shortness of breath: Secondary | ICD-10-CM

## 2018-05-08 DIAGNOSIS — J449 Chronic obstructive pulmonary disease, unspecified: Secondary | ICD-10-CM

## 2018-05-08 DIAGNOSIS — R062 Wheezing: Secondary | ICD-10-CM

## 2018-05-08 NOTE — Progress Notes (Signed)
Based on what you shared with me it looks like you have a serious condition that should be evaluated in a face to face office visit.  NOTE: If you entered your credit card information for this eVisit, you will not be charged. You may see a "hold" on your card for the $30 but that hold will drop off and you will not have a charge processed.  If you are having a true medical emergency please call 911.  If you need an urgent face to face visit, [facility] has four urgent care centers for your convenience.  If you need care fast and have a high deductible or no insurance consider:   [email or web address] to reserve your spot online an avoid wait times  [facility] [address], [address] [place], [place] [address] 8 am to 8 pm Monday-Friday 10 am to 4 pm Saturday-Sunday *Across the street from [employer]  [facility]  [address] [place], [address] 8 am to 5 pm Monday-Friday * In the [facility]   The following sites will take your  insurance:  . [facility]  [phone] Get Driving Directions Find a Provider at this Location  [address], [place] [address] . 10 am to 8 pm Monday-Friday . 12 pm to 8 pm Saturday-Sunday   . [facility]  [phone] Get Driving Directions Find a Provider at this Location  [address], [address] [place], [place] [address] . 8 am to 8 pm Monday-Friday . 9 am to 6 pm Saturday . 11 am to 6 pm Sunday   .  [facility]gent [facility] at [facility] [place]  [phone] Get Driving Directions  [address] [place],  [address] . 8 am to 8 pm Monday-Friday . 8 am to 4 pm Saturday-Sunday   Your e-visit answers were reviewed by a board certified advanced clinical practitioner to complete your personal care plan.  Thank you for using e-Visits.  

## 2018-05-09 ENCOUNTER — Encounter: Payer: Self-pay | Admitting: Nurse Practitioner

## 2018-05-09 ENCOUNTER — Ambulatory Visit: Payer: BLUE CROSS/BLUE SHIELD | Admitting: Nurse Practitioner

## 2018-05-09 VITALS — BP 128/76 | HR 93 | Temp 97.8°F | Resp 18 | Ht 62.0 in | Wt 155.4 lb

## 2018-05-09 DIAGNOSIS — R059 Cough, unspecified: Secondary | ICD-10-CM

## 2018-05-09 DIAGNOSIS — R05 Cough: Secondary | ICD-10-CM | POA: Diagnosis not present

## 2018-05-09 MED ORDER — BENZONATATE 100 MG PO CAPS: 100.0000 mg | ORAL_CAPSULE | Freq: Three times a day (TID) | ORAL | 0 refills | Status: DC | PRN

## 2018-05-09 MED ORDER — AZITHROMYCIN 250 MG PO TABS: ORAL_TABLET | ORAL | 0 refills | Status: DC

## 2018-05-09 NOTE — Patient Instructions (Addendum)
-   Drink at least 64 ounces of water - take cough medicine as prescribed also recommend taking guaifenesin 600mg  twice a day to help - Humidified air - if symptoms continue to worsen by Wednesday or do not improve by Friday please start antibiotic and complete entire course.    You likely have a viral upper respiratory infection (URI). Antibiotics will not reduce the number of days you are ill or prevent you from getting bacterial rhinosinusitis. A URI can take up to 14 days to resolve, but typically last between 7-11 days. Your body is so smart and strong that it will be fighting this illness off for you but it is important that you drink plenty of fluids, rest. Cover your nose/mouth when you cough or sneeze and wash your hands well and often. Here are some helpful things you can use or pick up over the counter from the pharmacy to help with your symptoms:   For Fever/Pain: Acetaminophen every 6 hours as needed (maximum of 3000mg  a day). If you are still uncomfortable you can add ibuprofen OR naproxen  For coughing: try dextromethorphan for a cough suppressant, and/or a cool mist humidifier, lozenges  For sore throat: saline gargles, honey herbal tea, lozenges, throat spray  To dry out your nose: try an antihistamine like loratadine (non-sedating) or diphenhydramine (sedating) or others To relieve a stuffy nose: try an oral decongestant  Like pseudoephedrine if you are under the age of 56 and do not have high blood pressure, neti pot To make blowing your nose easier: guaifenesin

## 2018-05-09 NOTE — Progress Notes (Signed)
Name: Jane Patrick   MRN: [id number]    DOB: 1961-04-20   Date:05/09/2018       Progress Note  Subjective  Chief Complaint  Chief Complaint  Patient presents with  . Sinus Problem  . Cough    productive at times - very thick  . Shortness of Breath  . Wheezing    very badly last night  . Sore Throat    right side more than left    HPI  Patient endorses sinus fullness, post nasal drainage, coughing a lot states progressively getting worse over the past 5 days. Endorses worsening wheezing and sore throat. Patient states using combivent a few times a day. Endorses fatigue. Shortness of breath with exertion   No fevers or chills.   Patient Active Problem List   Diagnosis Date Noted  . Adult hypothyroidism 08/15/2017  . Atherosclerosis of aorta (HCC) 09/22/2016  . Hiatal hernia 09/22/2016  . Abnormal liver CT 09/11/2016  . H/O herpes simplex type 2 infection 08/20/2015  . Hematuria 02/19/2015  . B12 deficiency 02/19/2015  . Attention deficit hyperactivity disorder (ADHD), predominantly inattentive type 11/13/2014  . COPD, mild (HCC) 11/13/2014  . Major depression in partial remission (HCC) 11/13/2014  . History of basal cell carcinoma 11/13/2014  . Gastroesophageal reflux disease without esophagitis 11/13/2014  . History of abnormal cervical Pap smear 11/13/2014  . Migraine without aura and without status migrainosus, not intractable 11/13/2014  . Vitamin D deficiency 11/13/2014  . Liver lesion 10/04/2010    Past Medical History:  Diagnosis Date  . Allergy   . Asthma   . COPD (chronic obstructive pulmonary disease) (HCC)   . Generalized headaches   . GERD (gastroesophageal reflux disease)     Past Surgical History:  Procedure Laterality Date  . ABDOMINAL HYSTERECTOMY  2004   partial  . BREAST SURGERY     biopsies  . FRACTURE SURGERY     rode in femur  . POLYPECTOMY      Social History   Tobacco Use  . Smoking status: Former Smoker    Packs/day: 1.00     Years: 25.00    Pack years: 25.00    Types: Cigarettes    Last attempt to quit: 05/25/2004    Years since quitting: 13.9  . Smokeless tobacco: Never Used  Substance Use Topics  . Alcohol use: No    Alcohol/week: 0.0 standard drinks     Current Outpatient Medications:  .  amphetamine-dextroamphetamine (ADDERALL) 20 MG tablet, Take 0.5-1 tablets (10-20 mg total) by mouth daily. One in am and half in pm, Disp: 45 tablet, Rfl: 0 .  amphetamine-dextroamphetamine (ADDERALL) 20 MG tablet, Take 0.5-1 tablets (10-20 mg total) by mouth daily. One in am and half in pm, Disp: 45 tablet, Rfl: 0 .  amphetamine-dextroamphetamine (ADDERALL) 20 MG tablet, Take 0.5-1 tablets (10-20 mg total) by mouth 2 (two) times daily. One in am and half in pm, Disp: 45 tablet, Rfl: 0 .  cholecalciferol (VITAMIN D) 1000 UNITS tablet, Take 2,000 Units by mouth daily., Disp: , Rfl:  .  DULoxetine (CYMBALTA) 60 MG capsule, Take 1 capsule (60 mg total) by mouth daily., Disp: 90 capsule, Rfl: 1 .  ibuprofen (ADVIL,MOTRIN) 200 MG tablet, Take by mouth., Disp: , Rfl:  .  Ipratropium-Albuterol (COMBIVENT RESPIMAT) 20-100 MCG/ACT AERS respimat, Inhale 1 puff into the lungs every 6 (six) hours as needed for wheezing or shortness of breath., Disp: 12 g, Rfl: 2 .  levothyroxine (SYNTHROID, LEVOTHROID) 25 MCG  tablet, TAKE 1 TABLET BY MOUTH DAILY BEFORE BREAKFAST AND 1 EXTRA TABLET ON MONDAY, WEDNESDAY AND FRIDAY, Disp: 135 tablet, Rfl: 0 .  SUMAtriptan (IMITREX) 100 MG tablet, Take 1 tablet (100 mg total) by mouth as needed for migraine. May repeat in 2 hours if headache persists or recurs., Disp: 18 tablet, Rfl: 1 .  topiramate (TOPAMAX) 100 MG tablet, TAKE 1 TABLET BY MOUTH TWO  TIMES DAILY, Disp: 180 tablet, Rfl: 1 .  traMADol (ULTRAM) 50 MG tablet, Take 1 tablet by mouth 2 (two) times daily as needed., Disp: , Rfl:  .  valACYclovir (VALTREX) 500 MG tablet, TAKE 2 TABLETS BY MOUTH TWICE DAILY FOR 10 DAYS, Disp: , Rfl:   Allergies   Allergen Reactions  . Codeine   . Pantoprazole     Flu like syptoms    ROS   No other specific complaints in a complete review of systems (except as listed in HPI above).  Objective  Vitals:   05/09/18 1436  BP: 128/76  Pulse: 93  Resp: 18  Temp: 97.8 F (36.6 C)  TempSrc: Oral  SpO2: 96%  Weight: 155 lb 6.4 oz (70.5 kg)  Height: 5\' 2"  (1.575 m)    Body mass index is 28.42 kg/m.  Nursing Note and Vital Signs reviewed.  Physical Exam Constitutional:      Appearance: She is well-developed.  HENT:     Head: Normocephalic and atraumatic.     Right Ear: Hearing and external ear normal.     Left Ear: Hearing and external ear normal.     Nose:     Right Sinus: Maxillary sinus tenderness present. No frontal sinus tenderness.     Left Sinus: Maxillary sinus tenderness present. No frontal sinus tenderness.     Mouth/Throat:     Lips: Pink.     Mouth: Mucous membranes are moist.     Pharynx: Oropharynx is clear. Uvula midline. No posterior oropharyngeal erythema.     Tonsils: No tonsillar exudate or tonsillar abscesses.  Eyes:     Extraocular Movements: Extraocular movements intact.     Pupils: Pupils are equal, round, and reactive to light.  Cardiovascular:     Rate and Rhythm: Bradycardia present.  Pulmonary:     Effort: Pulmonary effort is normal.     Breath sounds: Normal breath sounds. No wheezing. Decreased breath sounds: bilaterally throughout.  Skin:    General: Skin is warm and dry.     Findings: No erythema.  Neurological:     General: No focal deficit present.     Mental Status: She is alert and oriented to person, place, and time.  Psychiatric:        Mood and Affect: Mood normal.        Behavior: Behavior normal.      No results found for this or any previous visit (from the past 48 hour(s)).  Assessment & Plan  1. Cough - Drink at least 64 ounces of water - take cough medicine as prescribed also recommend taking guaifenesin 600mg  twice a  day to help - Humidified air - if symptoms continue to worsen by Wednesday or do not improve by Friday please start antibiotic and complete entire course.   - benzonatate (TESSALON PERLES) 100 MG capsule; Take 1 capsule (100 mg total) by mouth 3 (three) times daily as needed for cough.  Dispense: 30 capsule; Refill: 0 - azithromycin (ZITHROMAX) 250 MG tablet; Take 2 tablets on day 1 and 1 tablet a day for  the next 4 days.  Dispense: 6 tablet; Refill: 0

## 2018-05-16 ENCOUNTER — Other Ambulatory Visit: Payer: Self-pay | Admitting: Family Medicine

## 2018-05-16 DIAGNOSIS — J449 Chronic obstructive pulmonary disease, unspecified: Secondary | ICD-10-CM

## 2018-05-16 NOTE — Telephone Encounter (Signed)
Copied from CRM [phone]. Topic: Quick Communication - Rx Refill/Question >> May 16, 2018  5:38 PM Mickel BaasMcGee, Lorelie Biermann B, NT wrote: Medication: Ipratropium-Albuterol (COMBIVENT RESPIMAT) 20-100 MCG/ACT AERS respimat  Has the patient contacted their pharmacy? Yes.   (Agent: If no, request that the patient contact the pharmacy for the refill.) (Agent: If yes, when and what did the pharmacy advise?)  Preferred Pharmacy (with phone number or street name): [id number] - [place], [place] - [address]  Agent: Please be advised that RX refills may take up to 3 business days. We ask that you follow-up with your pharmacy.

## 2018-05-17 MED ORDER — IPRATROPIUM-ALBUTEROL 20-100 MCG/ACT IN AERS: 1.0000 | INHALATION_SPRAY | Freq: Four times a day (QID) | RESPIRATORY_TRACT | 2 refills | Status: DC | PRN

## 2018-06-10 ENCOUNTER — Other Ambulatory Visit: Payer: Self-pay | Admitting: Family Medicine

## 2018-06-10 DIAGNOSIS — F331 Major depressive disorder, recurrent, moderate: Secondary | ICD-10-CM

## 2018-06-10 NOTE — Telephone Encounter (Signed)
Refill request for general medication. Duloxetine   Last office visit 03/23/2018   Follow up on 07/01/18

## 2018-06-20 ENCOUNTER — Telehealth: Payer: Self-pay

## 2018-06-20 NOTE — Telephone Encounter (Signed)
Prior Authorization was initiated for Combivent Respimat 20-100 mcg and Aproved. The medication is approved through 06/20/2018-06/21/2019.

## 2018-07-01 ENCOUNTER — Ambulatory Visit: Payer: BLUE CROSS/BLUE SHIELD | Admitting: Family Medicine

## 2018-07-01 ENCOUNTER — Other Ambulatory Visit: Payer: Self-pay

## 2018-07-01 ENCOUNTER — Encounter: Payer: Self-pay | Admitting: Family Medicine

## 2018-07-01 VITALS — BP 124/72 | HR 90 | Temp 97.8°F | Resp 16 | Ht 62.0 in | Wt 158.6 lb

## 2018-07-01 DIAGNOSIS — Z79899 Other long term (current) drug therapy: Secondary | ICD-10-CM

## 2018-07-01 DIAGNOSIS — R1319 Other dysphagia: Secondary | ICD-10-CM

## 2018-07-01 DIAGNOSIS — E538 Deficiency of other specified B group vitamins: Secondary | ICD-10-CM

## 2018-07-01 DIAGNOSIS — E559 Vitamin D deficiency, unspecified: Secondary | ICD-10-CM

## 2018-07-01 DIAGNOSIS — R739 Hyperglycemia, unspecified: Secondary | ICD-10-CM

## 2018-07-01 DIAGNOSIS — J449 Chronic obstructive pulmonary disease, unspecified: Secondary | ICD-10-CM

## 2018-07-01 DIAGNOSIS — E039 Hypothyroidism, unspecified: Secondary | ICD-10-CM

## 2018-07-01 DIAGNOSIS — I700 Atherosclerosis of aorta: Secondary | ICD-10-CM

## 2018-07-01 DIAGNOSIS — G43009 Migraine without aura, not intractable, without status migrainosus: Secondary | ICD-10-CM | POA: Diagnosis not present

## 2018-07-01 DIAGNOSIS — E785 Hyperlipidemia, unspecified: Secondary | ICD-10-CM

## 2018-07-01 DIAGNOSIS — F900 Attention-deficit hyperactivity disorder, predominantly inattentive type: Secondary | ICD-10-CM | POA: Diagnosis not present

## 2018-07-01 DIAGNOSIS — R1310 Dysphagia, unspecified: Secondary | ICD-10-CM

## 2018-07-01 DIAGNOSIS — Z8619 Personal history of other infectious and parasitic diseases: Secondary | ICD-10-CM

## 2018-07-01 DIAGNOSIS — F324 Major depressive disorder, single episode, in partial remission: Secondary | ICD-10-CM

## 2018-07-01 MED ORDER — TOPIRAMATE 100 MG PO TABS: 100.0000 mg | ORAL_TABLET | Freq: Two times a day (BID) | ORAL | 1 refills | Status: DC

## 2018-07-01 MED ORDER — DULOXETINE HCL 60 MG PO CPEP: 60.0000 mg | ORAL_CAPSULE | Freq: Every day | ORAL | 0 refills | Status: DC

## 2018-07-01 MED ORDER — VALACYCLOVIR HCL 500 MG PO TABS: ORAL_TABLET | ORAL | 0 refills | Status: DC

## 2018-07-01 MED ORDER — LISDEXAMFETAMINE DIMESYLATE 40 MG PO CAPS: 40.0000 mg | ORAL_CAPSULE | ORAL | 0 refills | Status: DC

## 2018-07-01 NOTE — Progress Notes (Signed)
Name: Jane Patrick   MRN: [id number]    DOB: 23-May-1961   Date:07/01/2018       Progress Note  Subjective  Chief Complaint  Chief Complaint  Patient presents with  . Follow-up    3 month recheck    HPI  Migraine headache: she is on Topamax for prevention, she states skipped some doses recently, usually triggered by barometric pressure changes.Imitrex helps with symptoms, takes about 45 minutes to resolve the symptoms and she feels groggy the following day. Symptoms are described as throbbing, usually temporal but can radiate to the entire head. It is associates with nausea and vomiting, at times associated with phonophobia and photophobia. She states episodes about one every 3 months.   Chronic back pain: under the care of [name], taking Tramadol daily, pain level today is 3/10, symptoms are worse when standing or sitting too long.   Abnormal pap smear in Oct 26, 2010: she was referred to [facility], and advised to see [name].  She had a TAH in 26-Oct-2002 for treatment of abnormal cervical cells. She was seen by [name] at [facility] and last pap done in Feb 2017 was normal with positive HPV , October 2017, she missed appointment 02/2017 , she lost her job and has to re-schedule that she has insurance.No vaginal bleeding or cramping. She has insurance again and is due for repeat pap smear, she will contact gyn today   Herpes Type II: she had a small lesion when she was seen by [name] and culture was positive for herpes, she was given Valtrex, no other outbreaks since, she would like to keep one rx at pharmacy   Hematuria: seen at [facility] in 2010/10/26 , had multiple CT, last one in 26-Oct-2010 and stable liver lesions no further evaluation, patient states also told the same by Urologist for negative hematuria evaluation   ADD: she lost her job 2019/06/03but is working again , company is in [place]. She was on Adderal because of cost, but would like to try Vyvanse.   COPD Mild: occasional SOB ,  no coughorwheezing unless she has a flare. Last flare was Dec 2019 took Zpack and tessalon perles. . No longer smoking - quit about 12 years ago. She uses Combivent less often, at most 4 times weekly or less.   B12 deficiency: found in June 2016,currently only using SL B12 daily, we will recheck today   Major Depression in partial Remission: on Pristiq andoff AlprazolamSymptoms started when husband was diagnosed with lung cancer and died in 10-25-08.Currently  duloxetine, no side effects, however phq 9 has down to 2  Hypothyroidism: daughterhasHypothyroidism, TSH was elevated times two, she is now on levothyroxine,last TSH was at goal time to recheck labs   Atherosclerosis of aorta: she prefers not taking rx medication, she will try red rice yeast at home.   Dysphagia: history of esophageal stricture and had it dilated in the past , she states over the past year symptoms are returning and now is severe, sometimes she gets chocked, unable to eat meat and would like to go back.   Patient Active Problem List   Diagnosis Date Noted  . Adult hypothyroidism 08/15/2017  . Atherosclerosis of aorta (HCC) 09/22/2016  . Hiatal hernia 09/22/2016  . Abnormal liver CT 09/11/2016  . H/O herpes simplex type 2 infection 08/20/2015  . Hematuria 02/19/2015  . B12 deficiency 02/19/2015  . Attention deficit hyperactivity disorder (ADHD), predominantly inattentive type 11/13/2014  . COPD, mild (HCC) 11/13/2014  .  Major depression in partial remission (HCC) 11/13/2014  . History of basal cell carcinoma 11/13/2014  . Gastroesophageal reflux disease without esophagitis 11/13/2014  . History of abnormal cervical Pap smear 11/13/2014  . Migraine without aura and without status migrainosus, not intractable 11/13/2014  . Vitamin D deficiency 11/13/2014  . Liver lesion 10/04/2010    Past Surgical History:  Procedure Laterality Date  . ABDOMINAL HYSTERECTOMY  2004   partial  . BREAST SURGERY      biopsies  . FRACTURE SURGERY     rode in femur  . POLYPECTOMY      Family History  Problem Relation Age of Onset  . Colon cancer Father   . Breast cancer Sister   . Diverticulitis Brother   . Diverticulitis Brother     Social History   Socioeconomic History  . Marital status: Widowed    Spouse name: Not on file  . Number of children: 2  . Years of education: Not on file  . Highest education level: Associate degree: occupational, Scientist, product/process developmenttechnical, or vocational program  Occupational History  . Occupation: Doctor, general practiceAssistant Vice President   Social Needs  . Financial resource strain: Not hard at all  . Food insecurity:    Worry: Never true    Inability: Never true  . Transportation needs:    Medical: No    Non-medical: No  Tobacco Use  . Smoking status: Former Smoker    Packs/day: 1.00    Years: 25.00    Pack years: 25.00    Types: Cigarettes    Last attempt to quit: 05/25/2004    Years since quitting: 14.1  . Smokeless tobacco: Never Used  Substance and Sexual Activity  . Alcohol use: No    Alcohol/week: 0.0 standard drinks  . Drug use: No  . Sexual activity: Yes    Partners: Male  Lifestyle  . Physical activity:    Days per week: 7 days    Minutes per session: 30 min  . Stress: Not at all  Relationships  . Social connections:    Talks on phone: More than three times a week    Gets together: More than three times a week    Attends religious service: Never    Active member of club or organization: Yes    Attends meetings of clubs or organizations: More than 4 times per year    Relationship status: Widowed  . Intimate partner violence:    Fear of current or ex partner: No    Emotionally abused: No    Physically abused: No    Forced sexual activity: No  Other Topics Concern  . Not on file  Social History Narrative   Patient was laid off from May till Nov 2019, but is working again . Works from home for a [employer] company      Current Outpatient Medications:  .   cholecalciferol (VITAMIN D) 1000 UNITS tablet, Take 2,000 Units by mouth daily., Disp: , Rfl:  .  DULoxetine (CYMBALTA) 60 MG capsule, Take 1 capsule (60 mg total) by mouth daily., Disp: 90 capsule, Rfl: 0 .  ibuprofen (ADVIL,MOTRIN) 200 MG tablet, Take by mouth., Disp: , Rfl:  .  Ipratropium-Albuterol (COMBIVENT RESPIMAT) 20-100 MCG/ACT AERS respimat, Inhale 1 puff into the lungs every 6 (six) hours as needed for wheezing or shortness of breath., Disp: 12 g, Rfl: 2 .  levothyroxine (SYNTHROID, LEVOTHROID) 25 MCG tablet, TAKE 1 TABLET BY MOUTH DAILY BEFORE BREAKFAST AND 1 EXTRA TABLET ON MONDAY, WEDNESDAY AND  FRIDAY, Disp: 135 tablet, Rfl: 0 .  SUMAtriptan (IMITREX) 100 MG tablet, Take 1 tablet (100 mg total) by mouth as needed for migraine. May repeat in 2 hours if headache persists or recurs., Disp: 18 tablet, Rfl: 1 .  topiramate (TOPAMAX) 100 MG tablet, Take 1 tablet (100 mg total) by mouth 2 (two) times daily., Disp: 180 tablet, Rfl: 1 .  traMADol (ULTRAM) 50 MG tablet, Take 1 tablet by mouth 2 (two) times daily as needed., Disp: , Rfl:  .  valACYclovir (VALTREX) 500 MG tablet, TAKE 2 TABLETS BY MOUTH TWICE DAILY FOR 10 DAYS, Disp: 20 tablet, Rfl: 0 .  lisdexamfetamine (VYVANSE) 40 MG capsule, Take 1 capsule (40 mg total) by mouth every morning., Disp: 30 capsule, Rfl: 0  Allergies  Allergen Reactions  . Codeine   . Pantoprazole     Flu like syptoms    I personally reviewed active problem list, medication list, allergies, family history, social history with the patient/caregiver today.   ROS  Constitutional: Negative for fever or significant  weight change.  Respiratory: Negative for cough but has occasional shortness of breath.   Cardiovascular: Negative for chest pain or palpitations.  Gastrointestinal: Negative for abdominal pain, no bowel changes.  Musculoskeletal: Negative for gait problem or joint swelling.  Skin: Negative for rash.  Neurological: Negative for dizziness ,  positive for intermittent headache.  No other specific complaints in a complete review of systems (except as listed in HPI above).   Objective  Vitals:   07/01/18 0756  BP: 124/72  Pulse: 90  Resp: 16  Temp: 97.8 F (36.6 C)  TempSrc: Oral  SpO2: 96%  Weight: 158 lb 9.6 oz (71.9 kg)  Height: 5\' 2"  (1.575 m)    Body mass index is 29.01 kg/m.  Physical Exam  Constitutional: Patient appears well-developed and well-nourished. Overweight. No distress.  HEENT: head atraumatic, normocephalic, pupils equal and reactive to light,neck supple, throat within normal limits Cardiovascular: Normal rate, regular rhythm and normal heart sounds.  No murmur heard. No BLE edema. Pulmonary/Chest: Effort normal and breath sounds normal. No respiratory distress. Abdominal: Soft.  There is no tenderness. Psychiatric: Patient has a normal mood and affect. behavior is normal. Judgment and thought content normal.  PHQ2/9: [facility] screen [facility] 07/01/2018 05/09/2018 03/23/2018 12/10/2017 03/29/2017  Decreased Interest - 0 0 0 0  Down, Depressed, Hopeless - 0 1 3 0  PHQ - 2 Score - 0 1 3 0  Altered sleeping 0 0 1 3 1   Tired, decreased energy 1 3 3 3 3   Change in appetite 1 1 0 0 3  Feeling bad or failure about yourself  0 0 0 0 0  Trouble concentrating 0 0 1 0 0  Moving slowly or fidgety/restless 0 0 0 0 0  Suicidal thoughts 0 0 0 0 0  PHQ-9 Score - 4 6 9 7   Difficult doing work/chores - Not difficult at all Not difficult at all Very difficult Not difficult at all     Fall Risk: Fall Risk  07/01/2018 05/09/2018 03/23/2018 12/10/2017 06/30/2017  Falls in the past year? 0 0 No No No  Number falls in past yr: 0 0 - - -  Injury with Fall? 0 0 - - -  Follow up Falls evaluation completed - - - -     Assessment & Plan  1. COPD, mild (HCC)   2. B12 deficiency  - CBC with Differential/Platelet - B12  3. Attention deficit hyperactivity disorder (ADHD),  predominantly inattentive type  -  lisdexamfetamine (VYVANSE) 40 MG capsule; Take 1 capsule (40 mg total) by mouth every morning.  Dispense: 30 capsule; Refill: 0  4. Migraine without aura and without status migrainosus, not intractable  - topiramate (TOPAMAX) 100 MG tablet; Take 1 tablet (100 mg total) by mouth 2 (two) times daily.  Dispense: 180 tablet; Refill: 1  5. Major depressive disorder, single episode, in partial remission (HCC)  - DULoxetine (CYMBALTA) 60 MG capsule; Take 1 capsule (60 mg total) by mouth daily.  Dispense: 90 capsule; Refill: 0  6. Hypothyroidism in adult  - TSH  7. Atherosclerosis of aorta (HCC)   8. Dyslipidemia  - Lipid panel  9. Hyperglycemia  - Hemoglobin A1c  10. Long-term use of high-risk medication  - COMPLETE METABOLIC PANEL WITH GFR  11. Vitamin D deficiency  - VITAMIN D 25 Hydroxy (Vit-D Deficiency, Fractures)  12. Esophageal dysphagia  - Ambulatory referral to Gastroenterology  13. H/O herpes simplex type 2 infection  - valACYclovir (VALTREX) 500 MG tablet; TAKE 2 TABLETS BY MOUTH TWICE DAILY FOR 10 DAYS  Dispense: 20 tablet; Refill: 0

## 2018-07-02 LAB — COMPLETE METABOLIC PANEL WITH GFR
AG Ratio: 1.8 (calc) (ref 1.0–2.5)
ALBUMIN MSPROF: 4.3 g/dL (ref 3.6–5.1)
ALT: 14 U/L (ref 6–29)
AST: 13 U/L (ref 10–35)
Alkaline phosphatase (APISO): 83 U/L (ref 37–153)
BUN: 23 mg/dL (ref 7–25)
CO2: 26 mmol/L (ref 20–32)
Calcium: 9.4 mg/dL (ref 8.6–10.4)
Chloride: 109 mmol/L (ref 98–110)
Creat: 1.05 mg/dL (ref 0.50–1.05)
GFR, Est African American: 69 mL/min/{1.73_m2} (ref >=60)
GFR, Est Non African American: 59 mL/min/{1.73_m2} — ABNORMAL LOW (ref >=60)
GLOBULIN: 2.4 g/dL (ref 1.9–3.7)
Glucose, Bld: 89 mg/dL (ref 65–99)
Potassium: 4 mmol/L (ref 3.5–5.3)
Sodium: 142 mmol/L (ref 135–146)
Total Bilirubin: 0.4 mg/dL (ref 0.2–1.2)
Total Protein: 6.7 g/dL (ref 6.1–8.1)

## 2018-07-02 LAB — HEMOGLOBIN A1C
Hgb A1c MFr Bld: 5.6 % of total Hgb (ref <5.7)
MEAN PLASMA GLUCOSE: 114 (calc)
eAG (mmol/L): 6.3 (calc)

## 2018-07-02 LAB — CBC WITH DIFFERENTIAL/PLATELET
Absolute Monocytes: 394 cells/uL (ref 200–950)
Basophils Absolute: 41 cells/uL (ref 0–200)
Basophils Relative: 1 %
EOS ABS: 90 {cells}/uL (ref 15–500)
Eosinophils Relative: 2.2 %
HEMATOCRIT: 39.7 % (ref 35.0–45.0)
Hemoglobin: 13.2 g/dL (ref 11.7–15.5)
Lymphs Abs: 1275 cells/uL (ref 850–3900)
MCH: 27.9 pg (ref 27.0–33.0)
MCHC: 33.2 g/dL (ref 32.0–36.0)
MCV: 83.9 fL (ref 80.0–100.0)
MPV: 9.6 fL (ref 7.5–12.5)
Monocytes Relative: 9.6 %
NEUTROS PCT: 56.1 %
Neutro Abs: 2300 cells/uL (ref 1500–7800)
Platelets: 198 10*3/uL (ref 140–400)
RBC: 4.73 10*6/uL (ref 3.80–5.10)
RDW: 13.6 % (ref 11.0–15.0)
Total Lymphocyte: 31.1 %
WBC: 4.1 10*3/uL (ref 3.8–10.8)

## 2018-07-02 LAB — LIPID PANEL
Cholesterol: 234 mg/dL — ABNORMAL HIGH (ref <200)
HDL: 47 mg/dL — ABNORMAL LOW (ref >=50)
LDL Cholesterol (Calc): 158 mg/dL (calc) — ABNORMAL HIGH
NON-HDL CHOLESTEROL (CALC): 187 mg/dL — AB (ref <130)
Total CHOL/HDL Ratio: 5 (calc) — ABNORMAL HIGH (ref <5.0)
Triglycerides: 154 mg/dL — ABNORMAL HIGH (ref <150)

## 2018-07-02 LAB — VITAMIN D 25 HYDROXY (VIT D DEFICIENCY, FRACTURES): Vit D, 25-Hydroxy: 26 ng/mL — ABNORMAL LOW (ref 30–100)

## 2018-07-02 LAB — VITAMIN B12: Vitamin B-12: 637 pg/mL (ref 200–1100)

## 2018-07-02 LAB — TSH: TSH: 2.05 mIU/L (ref 0.40–4.50)

## 2018-07-27 ENCOUNTER — Ambulatory Visit: Payer: 59 | Admitting: Gastroenterology

## 2018-07-27 ENCOUNTER — Other Ambulatory Visit: Payer: Self-pay

## 2018-07-27 ENCOUNTER — Encounter: Payer: Self-pay | Admitting: Gastroenterology

## 2018-07-27 VITALS — BP 130/86 | HR 84 | Resp 17 | Ht 62.0 in | Wt 161.2 lb

## 2018-07-27 DIAGNOSIS — R1319 Other dysphagia: Secondary | ICD-10-CM

## 2018-07-27 DIAGNOSIS — R1310 Dysphagia, unspecified: Secondary | ICD-10-CM

## 2018-07-27 DIAGNOSIS — K222 Esophageal obstruction: Secondary | ICD-10-CM

## 2018-07-27 NOTE — Progress Notes (Signed)
Arlyss Repress, MD [address]  [address]  [place], [place] [address]  Main: [phone]  Fax: [phone]    Gastroenterology Consultation  Referring Provider:     Alba Cory, MD Primary Care Physician:  Alba Cory, MD Primary Gastroenterologist:  Dr. Arlyss Repress Reason for Consultation:     Dysphagia        HPI:   Jane Patrick is a 58 y.o. female referred by Dr. Alba Cory, MD  for consultation & management of dysphagia.  She reports onset of dysphagia to solids, which started 3 months ago, worsening, mostly to solids, meat. A/w regurgitation of food when unable to pass it down.  Reports having similar episode approximately 5 years ago by Dr. Servando Snare and underwent EGD, found to have esophageal stricture, underwent dilation.  She was told that she can have EGD as needed if she has recurrence of dysphagia.  Currently, her dysphagia is progressively getting worse.  She denies heartburn.  She takes Tums as needed.  She tried pantoprazole which caused flulike symptoms, therefore discontinued it.  NSAIDs: goody powder for headache once daily  Antiplts/Anticoagulants/Anti thrombotics: None  GI Procedures:  EGD and colonoscopy ~ 5 years ago in [place] by Dr Servando Snare Was told she needs screening colon in 10years Father with some type of GI malignancy, not alive  Past Medical History:  Diagnosis Date  . Allergy   . Asthma   . COPD (chronic obstructive pulmonary disease) (HCC)   . Generalized headaches   . GERD (gastroesophageal reflux disease)     Past Surgical History:  Procedure Laterality Date  . ABDOMINAL HYSTERECTOMY  2004   partial  . BREAST SURGERY     biopsies  . FRACTURE SURGERY     rode in femur  . POLYPECTOMY     Current Outpatient Medications:  .  cholecalciferol (VITAMIN D) 1000 UNITS tablet, Take 2,000 Units by mouth daily., Disp: , Rfl:  .  DULoxetine (CYMBALTA) 60 MG capsule, Take 1 capsule (60 mg total) by mouth daily., Disp: 90  capsule, Rfl: 0 .  ibuprofen (ADVIL,MOTRIN) 200 MG tablet, Take by mouth., Disp: , Rfl:  .  Ipratropium-Albuterol (COMBIVENT RESPIMAT) 20-100 MCG/ACT AERS respimat, Inhale 1 puff into the lungs every 6 (six) hours as needed for wheezing or shortness of breath., Disp: 12 g, Rfl: 2 .  levothyroxine (SYNTHROID, LEVOTHROID) 25 MCG tablet, TAKE 1 TABLET BY MOUTH DAILY BEFORE BREAKFAST AND 1 EXTRA TABLET ON MONDAY, WEDNESDAY AND FRIDAY, Disp: 135 tablet, Rfl: 0 .  lisdexamfetamine (VYVANSE) 40 MG capsule, Take 1 capsule (40 mg total) by mouth every morning., Disp: 30 capsule, Rfl: 0 .  SUMAtriptan (IMITREX) 100 MG tablet, Take 1 tablet (100 mg total) by mouth as needed for migraine. May repeat in 2 hours if headache persists or recurs., Disp: 18 tablet, Rfl: 1 .  topiramate (TOPAMAX) 100 MG tablet, Take 1 tablet (100 mg total) by mouth 2 (two) times daily., Disp: 180 tablet, Rfl: 1 .  traMADol (ULTRAM) 50 MG tablet, Take 1 tablet by mouth 2 (two) times daily as needed., Disp: , Rfl:  .  valACYclovir (VALTREX) 500 MG tablet, TAKE 2 TABLETS BY MOUTH TWICE DAILY FOR 10 DAYS, Disp: 20 tablet, Rfl: 0   Family History  Problem Relation Age of Onset  . Colon cancer Father   . Breast cancer Sister   . Diverticulitis Brother   . Diverticulitis Brother      Social History   Tobacco Use  .  Smoking status: Former Smoker    Packs/day: 1.00    Years: 25.00    Pack years: 25.00    Types: Cigarettes    Last attempt to quit: 05/25/2004    Years since quitting: 14.1  . Smokeless tobacco: Never Used  Substance Use Topics  . Alcohol use: No    Alcohol/week: 0.0 standard drinks  . Drug use: No    Allergies as of 07/27/2018 - Review Complete 07/27/2018  Allergen Reaction Noted  . Codeine  09/28/2014  . Pantoprazole  02/19/2015    Review of Systems:    All systems reviewed and negative except where noted in HPI.   Physical Exam:  BP 130/86 (BP Location: Left Arm, Patient Position: Sitting, Cuff Size:  Normal)   Pulse 84   Resp 17   Ht 5\' 2"  (1.575 m)   Wt 161 lb 3.2 oz (73.1 kg)   BMI 29.48 kg/m  No LMP recorded. Patient has had a hysterectomy.  General:   Alert,  Well-developed, well-nourished, pleasant and cooperative in NAD Head:  Normocephalic and atraumatic. Eyes:  Sclera clear, no icterus.   Conjunctiva pink. Ears:  Normal auditory acuity. Nose:  No deformity, discharge, or lesions. Mouth:  No deformity or lesions,oropharynx pink & moist. Neck:  Supple; no masses or thyromegaly. Lungs:  Respirations even and unlabored.  Clear throughout to auscultation.   No wheezes, crackles, or rhonchi. No acute distress. Heart:  Regular rate and rhythm; no murmurs, clicks, rubs, or gallops. Abdomen:  Normal bowel sounds. Soft, non-tender and non-distended without masses, hepatosplenomegaly or hernias noted.  No guarding or rebound tenderness.   Rectal: Not performed Msk:  Symmetrical without gross deformities. Good, equal movement & strength bilaterally. Pulses:  Normal pulses noted. Extremities:  No clubbing or edema.  No cyanosis. Neurologic:  Alert and oriented x3;  grossly normal neurologically. Skin:  Intact without significant lesions or rashes. No jaundice. Psych:  Alert and cooperative. Normal mood and affect.  Imaging Studies: Viewed  Assessment and Plan:   Jane Patrick is a 58 y.o. Caucasian female with history of hypothyroidism, dysphagia, esophageal stricture status post dilation in the past presents with 3 months history of progressively worsening dysphagia to solids, in setting of regular intake of Goody powder for chronic headaches  Esophageal dysphagia Recommend avoid Goody powder and other NSAIDs Recommend EGD for further evaluation Advised her to stay away from hard meats until EGD is performed I will hold off on PPI until EGD is performed given her history of flulike symptoms to pantoprazole   Follow up in 2 months   Arlyss Repress, MD

## 2018-08-01 ENCOUNTER — Encounter: Payer: Self-pay | Admitting: *Deleted

## 2018-08-02 ENCOUNTER — Ambulatory Visit: Payer: 59 | Admitting: Anesthesiology

## 2018-08-02 ENCOUNTER — Other Ambulatory Visit: Payer: Self-pay

## 2018-08-02 ENCOUNTER — Encounter: Payer: Self-pay | Admitting: Anesthesiology

## 2018-08-02 ENCOUNTER — Encounter
Admission: RE | Disposition: A | Discharge status: Home / Self Care | Payer: Self-pay | Source: Home / Self Care | Attending: Gastroenterology

## 2018-08-02 ENCOUNTER — Ambulatory Visit
Admission: RE | Admit: 2018-08-02 | Discharge: 2018-08-02 | Disposition: A | Discharge status: Home / Self Care | Payer: 59 | Attending: Gastroenterology | Admitting: Gastroenterology

## 2018-08-02 DIAGNOSIS — K449 Diaphragmatic hernia without obstruction or gangrene: Secondary | ICD-10-CM | POA: Diagnosis not present

## 2018-08-02 DIAGNOSIS — Z87891 Personal history of nicotine dependence: Secondary | ICD-10-CM | POA: Insufficient documentation

## 2018-08-02 DIAGNOSIS — K222 Esophageal obstruction: Secondary | ICD-10-CM | POA: Diagnosis not present

## 2018-08-02 DIAGNOSIS — Z791 Long term (current) use of non-steroidal anti-inflammatories (NSAID): Secondary | ICD-10-CM | POA: Diagnosis not present

## 2018-08-02 DIAGNOSIS — K219 Gastro-esophageal reflux disease without esophagitis: Secondary | ICD-10-CM | POA: Insufficient documentation

## 2018-08-02 DIAGNOSIS — R1310 Dysphagia, unspecified: Secondary | ICD-10-CM | POA: Diagnosis not present

## 2018-08-02 DIAGNOSIS — Z79890 Hormone replacement therapy (postmenopausal): Secondary | ICD-10-CM | POA: Insufficient documentation

## 2018-08-02 DIAGNOSIS — R1319 Other dysphagia: Secondary | ICD-10-CM

## 2018-08-02 DIAGNOSIS — Z885 Allergy status to narcotic agent status: Secondary | ICD-10-CM | POA: Diagnosis not present

## 2018-08-02 DIAGNOSIS — R1314 Dysphagia, pharyngoesophageal phase: Secondary | ICD-10-CM | POA: Insufficient documentation

## 2018-08-02 DIAGNOSIS — J449 Chronic obstructive pulmonary disease, unspecified: Secondary | ICD-10-CM | POA: Diagnosis not present

## 2018-08-02 DIAGNOSIS — K259 Gastric ulcer, unspecified as acute or chronic, without hemorrhage or perforation: Secondary | ICD-10-CM

## 2018-08-02 DIAGNOSIS — Z79899 Other long term (current) drug therapy: Secondary | ICD-10-CM | POA: Insufficient documentation

## 2018-08-02 HISTORY — PX: ESOPHAGOGASTRODUODENOSCOPY (EGD) WITH PROPOFOL: SHX5813

## 2018-08-02 SURGERY — ESOPHAGOGASTRODUODENOSCOPY (EGD) WITH PROPOFOL
Anesthesia: General

## 2018-08-02 MED ORDER — PROPOFOL 10 MG/ML IV BOLUS
INTRAVENOUS | Status: DC | PRN
  Administered 2018-08-02: 40 mg via INTRAVENOUS
  Administered 2018-08-02 (×2): 30 mg via INTRAVENOUS
  Administered 2018-08-02: 60 mg via INTRAVENOUS
  Administered 2018-08-02 (×3): 30 mg via INTRAVENOUS

## 2018-08-02 MED ORDER — SODIUM CHLORIDE 0.9 % IV SOLN
INTRAVENOUS | Status: DC
  Administered 2018-08-02: 13:00:00 via INTRAVENOUS

## 2018-08-02 MED ORDER — LIDOCAINE HCL (CARDIAC) PF 100 MG/5ML IV SOSY
PREFILLED_SYRINGE | INTRAVENOUS | Status: DC | PRN
  Administered 2018-08-02: 100 mg via INTRAVENOUS

## 2018-08-02 NOTE — Anesthesia Preprocedure Evaluation (Signed)
Anesthesia Evaluation  Patient identified by MRN, date of birth, ID band Patient awake    Reviewed: Allergy & Precautions, NPO status , Patient's Chart, lab work & pertinent test results, reviewed documented beta blocker date and time   Airway Mallampati: III  TM Distance: >3 FB     Dental  (+) Chipped   Pulmonary asthma , COPD, former smoker,           Cardiovascular      Neuro/Psych  Headaches, PSYCHIATRIC DISORDERS Depression    GI/Hepatic hiatal hernia, GERD  ,  Endo/Other  Hypothyroidism   Renal/GU      Musculoskeletal   Abdominal   Peds  Hematology   Anesthesia Other Findings ADD.  Reproductive/Obstetrics                             Anesthesia Physical Anesthesia Plan  ASA: III  Anesthesia Plan: General   Post-op Pain Management:    Induction: Intravenous  PONV Risk Score and Plan:   Airway Management Planned:   Additional Equipment:   Intra-op Plan:   Post-operative Plan:   Informed Consent: I have reviewed the patients History and Physical, chart, labs and discussed the procedure including the risks, benefits and alternatives for the proposed anesthesia with the patient or authorized representative who has indicated his/her understanding and acceptance.       Plan Discussed with: CRNA  Anesthesia Plan Comments:         Anesthesia Quick Evaluation

## 2018-08-02 NOTE — Anesthesia Postprocedure Evaluation (Signed)
Anesthesia Post Note  Patient: Jane Patrick  Procedure(s) Performed: ESOPHAGOGASTRODUODENOSCOPY (EGD) WITH PROPOFOL (N/A )  Patient location during evaluation: Endoscopy Anesthesia Type: General Level of consciousness: awake and alert Pain management: pain level controlled Vital Signs Assessment: post-procedure vital signs reviewed and stable Respiratory status: spontaneous breathing, nonlabored ventilation, respiratory function stable and patient connected to nasal cannula oxygen Cardiovascular status: blood pressure returned to baseline and stable Postop Assessment: no apparent nausea or vomiting Anesthetic complications: no     Last Vitals:  Vitals:   08/02/18 1340 08/02/18 1350  BP: 107/68 118/77  Pulse: 84 77  Resp: 17 19  Temp: (!) 36.3 C   SpO2:  98%    Last Pain:  Vitals:   08/02/18 1340  TempSrc: Tympanic  PainSc:                  Ira Busbin S

## 2018-08-02 NOTE — Op Note (Signed)
[facility] Gastroenterology Patient Name: Jane Patrick Procedure Date: 08/02/2018 12:26 PM MRN: [id number] Account #: [id number] Date of Birth: [birth year] Admit Type: Outpatient Age: 58 Room: [facility] ENDO ROOM 4 Gender: Female Note Status: Finalized Procedure:            Upper GI endoscopy Indications:          Esophageal dysphagia, For therapy of esophageal                        stricture Providers:            Lin Landsman MD, MD Medicines:            Monitored Anesthesia Care Complications:        No immediate complications. Estimated blood loss:                        Minimal. Procedure:            Pre-Anesthesia Assessment:                       - Prior to the procedure, a History and Physical was                        performed, and patient medications and allergies were                        reviewed. The patient is competent. The risks and                        benefits of the procedure and the sedation options and                        risks were discussed with the patient. All questions                        were answered and informed consent was obtained.                        Patient identification and proposed procedure were                        verified by the physician, the nurse, the                        anesthesiologist, the anesthetist and the technician in                        the pre-procedure area in the procedure room in the                        endoscopy suite. Mental Status Examination: alert and                        oriented. Airway Examination: normal oropharyngeal                        airway and neck mobility. Respiratory Examination:                        clear to auscultation. CV Examination: normal.  Prophylactic Antibiotics: The patient does not require                        prophylactic antibiotics. Prior Anticoagulants: The                        patient has taken no previous  anticoagulant or                        antiplatelet agents. ASA Grade Assessment: III - A                        patient with severe systemic disease. After reviewing                        the risks and benefits, the patient was deemed in                        satisfactory condition to undergo the procedure. The                        anesthesia plan was to use monitored anesthesia care                        (MAC). Immediately prior to administration of                        medications, the patient was re-assessed for adequacy                        to receive sedatives. The heart rate, respiratory rate,                        oxygen saturations, blood pressure, adequacy of                        pulmonary ventilation, and response to care were                        monitored throughout the procedure. The physical status                        of the patient was re-assessed after the procedure.                       After obtaining informed consent, the endoscope was                        passed under direct vision. Throughout the procedure,                        the patient's blood pressure, pulse, and oxygen                        saturations were monitored continuously. The Endoscope                        was introduced through the mouth, and advanced to the  second part of duodenum. The upper GI endoscopy was                        accomplished without difficulty. The patient tolerated                        the procedure fairly well. Findings:      The duodenal bulb and second portion of the duodenum were normal.      A 1 cm hiatal hernia with a few Cameron ulcers was found.      The cardia and gastric fundus were normal on retroflexion.      One benign-appearing, intrinsic moderate (circumferential scarring or       stenosis; an endoscope may pass) stenosis was found at the       gastroesophageal junction. This stenosis measured less than one cm (in        length). The stenosis was traversed. A TTS dilator was passed through       the scope. Dilation with an 12-31-08 mm balloon, a 03-05-11 mm balloon and       a 12-13.5-15 mm balloon dilator was performed to 15 mm. The dilation       site was examined following endoscope reinsertion and showed mild       mucosal disruption. Estimated blood loss was minimal. Impression:           - Normal duodenal bulb and second portion of the                        duodenum.                       - 1 cm hiatal hernia with a few Cameron ulcers.                       - Benign-appearing esophageal stenosis. Dilated.                       - No specimens collected. Recommendation:       - Discharge patient to home (with escort).                       - Resume previous diet today.                       - Continue present medications.                       - Use Prilosec (omeprazole) 20 mg PO BID today.                       - No aspirin, ibuprofen, naproxen, or other                        non-steroidal anti-inflammatory drugs.                       - Return to my office as previously scheduled. Procedure Code(s):    --- Professional ---                       [phone], Esophagogastroduodenoscopy, flexible, transoral;  with transendoscopic balloon dilation of esophagus                        (less than 30 mm diameter) Diagnosis Code(s):    --- Professional ---                       K44.9, Diaphragmatic hernia without obstruction or                        gangrene                       K25.9, Gastric ulcer, unspecified as acute or chronic,                        without hemorrhage or perforation                       K22.2, Esophageal obstruction                       R13.14, Dysphagia, pharyngoesophageal phase CPT copyright 2018 American Medical Association. All rights reserved. The codes documented in this report are preliminary and upon coder review may  be revised to meet current  compliance requirements. Dr. Ulyess Mort Lin Landsman MD, MD 08/02/2018 1:40:36 PM This report has been signed electronically. Number of Addenda: 0 Note Initiated On: 08/02/2018 12:26 PM      [facility]

## 2018-08-02 NOTE — H&P (Signed)
Arlyss Repress, MD [address]  [address]  [place], [place] [address]  Main: [phone]  Fax: [phone] Pager: [phone]  Primary Care Physician:  Alba Cory, MD Primary Gastroenterologist:  Dr. Arlyss Repress  Pre-Procedure History & Physical: HPI:  Jane Patrick is a 58 y.o. female is here for an endoscopy.   Past Medical History:  Diagnosis Date  . Allergy   . Asthma   . COPD (chronic obstructive pulmonary disease) (HCC)   . Generalized headaches   . GERD (gastroesophageal reflux disease)     Past Surgical History:  Procedure Laterality Date  . ABDOMINAL HYSTERECTOMY  2004   partial  . BREAST SURGERY     biopsies  . FRACTURE SURGERY     rode in femur  . POLYPECTOMY     vocal chord polyps    Prior to Admission medications   Medication Sig Start Date End Date Taking? Authorizing Provider  cholecalciferol (VITAMIN D) 1000 UNITS tablet Take 2,000 Units by mouth daily.   Yes [provider]  DULoxetine (CYMBALTA) 60 MG capsule Take 1 capsule (60 mg total) by mouth daily. 07/01/18  Yes Sowles, Danna Hefty, MD  ibuprofen (ADVIL,MOTRIN) 200 MG tablet Take by mouth.   Yes [provider]  Ipratropium-Albuterol (COMBIVENT RESPIMAT) 20-100 MCG/ACT AERS respimat Inhale 1 puff into the lungs every 6 (six) hours as needed for wheezing or shortness of breath. 05/17/18  Yes Sowles, Danna Hefty, MD  levothyroxine (SYNTHROID, LEVOTHROID) 25 MCG tablet TAKE 1 TABLET BY MOUTH DAILY BEFORE BREAKFAST AND 1 EXTRA TABLET ON MONDAY, [facility] AND FRIDAY 12/28/17  Yes Sowles, Danna Hefty, MD  lisdexamfetamine (VYVANSE) 40 MG capsule Take 1 capsule (40 mg total) by mouth every morning. 07/01/18  Yes Sowles, Danna Hefty, MD  SUMAtriptan (IMITREX) 100 MG tablet Take 1 tablet (100 mg total) by mouth as needed for migraine. May repeat in 2 hours if headache persists or recurs. 12/10/17  Yes Sowles, Danna Hefty, MD  topiramate (TOPAMAX) 100 MG tablet Take 1 tablet (100 mg total) by mouth 2  (two) times daily. 07/01/18  Yes Sowles, Danna Hefty, MD  traMADol (ULTRAM) 50 MG tablet Take 1 tablet by mouth 2 (two) times daily as needed. 01/07/17  Yes Chasnis, Sharlet Salina, MD  valACYclovir (VALTREX) 500 MG tablet TAKE 2 TABLETS BY MOUTH TWICE DAILY FOR 10 DAYS 07/01/18  Yes Alba Cory, MD    Allergies as of 07/27/2018 - Review Complete 07/27/2018  Allergen Reaction Noted  . Codeine  09/28/2014  . Pantoprazole  02/19/2015    Family History  Problem Relation Age of Onset  . Colon cancer Father   . Breast cancer Sister   . Diverticulitis Brother   . Diverticulitis Brother     Social History   Socioeconomic History  . Marital status: Widowed    Spouse name: Not on file  . Number of children: 2  . Years of education: Not on file  . Highest education level: Associate degree: occupational, Scientist, product/process development, or vocational program  Occupational History  . Occupation: Doctor, general practice   Social Needs  . Financial resource strain: Not hard at all  . Food insecurity:    Worry: Never true    Inability: Never true  . Transportation needs:    Medical: No    Non-medical: No  Tobacco Use  . Smoking status: Former Smoker    Packs/day: 1.00    Years: 25.00    Pack years: 25.00    Types: Cigarettes    Last attempt to quit:  05/25/2004    Years since quitting: 14.1  . Smokeless tobacco: Never Used  Substance and Sexual Activity  . Alcohol use: No    Alcohol/week: 0.0 standard drinks  . Drug use: No  . Sexual activity: Yes    Partners: Male  Lifestyle  . Physical activity:    Days per week: 7 days    Minutes per session: 30 min  . Stress: Not at all  Relationships  . Social connections:    Talks on phone: More than three times a week    Gets together: More than three times a week    Attends religious service: Never    Active member of club or organization: Yes    Attends meetings of clubs or organizations: More than 4 times per year    Relationship status: Widowed  . Intimate  partner violence:    Fear of current or ex partner: No    Emotionally abused: No    Physically abused: No    Forced sexual activity: No  Other Topics Concern  . Not on file  Social History Narrative   Patient was laid off from May till Nov 2019, but is working again . Works from home for a [employer]     Review of Systems: See HPI, otherwise negative ROS  Physical Exam: BP 107/68   Pulse 84   Temp (!) 97.4 F (36.3 C) (Tympanic)   Resp 17  General:   Alert,  pleasant and cooperative in NAD Head:  Normocephalic and atraumatic. Neck:  Supple; no masses or thyromegaly. Lungs:  Clear throughout to auscultation.    Heart:  Regular rate and rhythm. Abdomen:  Soft, nontender and nondistended. Normal bowel sounds, without guarding, and without rebound.   Neurologic:  Alert and  oriented x4;  grossly normal neurologically.  Impression/Plan: Jane Patrick is here for an endoscopy to be performed for dysphagia  Risks, benefits, limitations, and alternatives regarding  endoscopy have been reviewed with the patient.  Questions have been answered.  All parties agreeable.   Lannette Donath, MD  08/02/2018, 1:45 PM

## 2018-08-02 NOTE — Transfer of Care (Signed)
Immediate Anesthesia Transfer of Care Note  Patient: Jane Patrick  Procedure(s) Performed: ESOPHAGOGASTRODUODENOSCOPY (EGD) WITH PROPOFOL (N/A )  Patient Location: Endoscopy Unit  Anesthesia Type:General  Level of Consciousness: awake, alert , oriented and patient cooperative  Airway & Oxygen Therapy: Patient Spontanous Breathing  Post-op Assessment: Report given to RN, Post -op Vital signs reviewed and stable and Patient moving all extremities  Post vital signs: Reviewed and stable  Last Vitals:  Vitals Value Taken Time  BP 107/68 08/02/2018  1:40 PM  Temp 36.3 C 08/02/2018  1:40 PM  Pulse 83 08/02/2018  1:42 PM  Resp 15 08/02/2018  1:42 PM  SpO2 98 % 08/02/2018  1:42 PM  Vitals shown include unvalidated device data.  Last Pain:  Vitals:   08/02/18 1340  TempSrc: Tympanic  PainSc:          Complications: No apparent anesthesia complications

## 2018-08-02 NOTE — Anesthesia Post-op Follow-up Note (Signed)
Anesthesia QCDR form completed.        

## 2018-08-03 ENCOUNTER — Encounter: Payer: Self-pay | Admitting: Gastroenterology

## 2018-08-14 ENCOUNTER — Other Ambulatory Visit: Payer: Self-pay | Admitting: Family Medicine

## 2018-08-14 DIAGNOSIS — E039 Hypothyroidism, unspecified: Secondary | ICD-10-CM

## 2018-08-15 ENCOUNTER — Other Ambulatory Visit: Payer: Self-pay | Admitting: Family Medicine

## 2018-08-15 DIAGNOSIS — F900 Attention-deficit hyperactivity disorder, predominantly inattentive type: Secondary | ICD-10-CM

## 2018-08-15 MED ORDER — LISDEXAMFETAMINE DIMESYLATE 40 MG PO CAPS: 40.0000 mg | ORAL_CAPSULE | ORAL | 0 refills | Status: DC

## 2018-08-15 NOTE — Telephone Encounter (Signed)
Refill request for thyroid medication. Levothyroxine to [facility].   Last Physical: 07/01/2018   Lab Results  Component Value Date   TSH 2.05 07/01/2018    Follow up on 10/04/2018

## 2018-08-15 NOTE — Telephone Encounter (Signed)
Refill request for general medication. Vyvanse    Last office visit 07/01/2018   Follow up on 10/04/2018

## 2018-08-15 NOTE — Telephone Encounter (Signed)
Patient states she is doing well and her symptoms are improving with Vyvanse.

## 2018-09-14 ENCOUNTER — Ambulatory Visit (INDEPENDENT_AMBULATORY_CARE_PROVIDER_SITE_OTHER): Payer: 59 | Admitting: Gastroenterology

## 2018-09-14 DIAGNOSIS — Z9889 Other specified postprocedural states: Secondary | ICD-10-CM

## 2018-09-14 MED ORDER — OMEPRAZOLE 40 MG PO CPDR: 40.0000 mg | DELAYED_RELEASE_CAPSULE | Freq: Every day | ORAL | 2 refills | Status: DC

## 2018-09-14 NOTE — Progress Notes (Signed)
[name], MD [address]  [address]  [place], [place] [address]  Main: [phone]  Fax: [phone]    Gastroenterology follow-up video Visit  Referring Provider:     Alba CorySowles, Krichna, MD Primary Care Physician:  Alba CorySowles, Krichna, MD Primary Gastroenterologist:  Dr. Arlyss Repressohini R Sears Oran Reason for Consultation:     Dysphagia, esophageal stricture        HPI:   Jane Patrick is a 58 y.o. female referred by Dr. Alba CorySowles, Krichna, MD  for consultation & management of esophageal stricture  Virtual Visit Video Note  I connected with Jane Patrick on 09/14/18 at  8:30 AM EDT by video and verified that I am speaking with the correct person using two identifiers.   I discussed the limitations, risks, security and privacy concerns of performing an evaluation and management service by video and the availability of in person appointments. I also discussed with the patient that there may be a patient responsible charge related to this service. The patient expressed understanding and agreed to proceed.  Location of the Patient: Home  Location of the provider: Home office   History of Present Illness: Initially saw Ms. Jane Patrick in early March 2020 for chronic dysphagia to solids.  She was taking Goody powder for headaches.  Subsequently, she underwent EGD which revealed benign esophageal stricture, hiatal hernia and Cameron ulcers, stricture was dilated to 15 mm.  She reports that since then she has been tolerating solid foods well other than one episode of dysphagia which she thinks she might have had swallowed large piece.  She is trying to take omeprazole 20 mg OTC about 3 times a week only.  She stopped taking Goody powder.  She feels well overall.   NSAIDs: Goody powder for headache in the past, stopped in 07/2018  Antiplts/Anticoagulants/Anti thrombotics: None  GI Procedures:  EGD and colonoscopy ~ 5 years ago in [place] by Dr Servando SnareWohl Was told she needs screening colon in 10years  Father with some type of GI malignancy, not alive  EGD 08/02/2018 - Normal duodenal bulb and second portion of the duodenum. - 1 cm hiatal hernia with a few Cameron ulcers. - Benign-appearing esophageal stenosis. Dilated. - No specimens collected.  Past Medical History:  Diagnosis Date  . Allergy   . Asthma   . COPD (chronic obstructive pulmonary disease) (HCC)   . Generalized headaches   . GERD (gastroesophageal reflux disease)     Past Surgical History:  Procedure Laterality Date  . ABDOMINAL HYSTERECTOMY  2004   partial  . BREAST SURGERY     biopsies  . ESOPHAGOGASTRODUODENOSCOPY (EGD) WITH PROPOFOL N/A 08/02/2018   Procedure: ESOPHAGOGASTRODUODENOSCOPY (EGD) WITH PROPOFOL;  Surgeon: Toney ReilVanga, Mable Dara Reddy, MD;  [facility];  Service: Gastroenterology;  Laterality: N/A;  . FRACTURE SURGERY     rode in femur  . POLYPECTOMY     vocal chord polyps    Current Outpatient Medications:  .  cholecalciferol (VITAMIN D) 1000 UNITS tablet, Take 2,000 Units by mouth daily., Disp: , Rfl:  .  DULoxetine (CYMBALTA) 60 MG capsule, Take 1 capsule (60 mg total) by mouth daily., Disp: 90 capsule, Rfl: 0 .  ibuprofen (ADVIL,MOTRIN) 200 MG tablet, Take by mouth., Disp: , Rfl:  .  Ipratropium-Albuterol (COMBIVENT RESPIMAT) 20-100 MCG/ACT AERS respimat, Inhale 1 puff into the lungs every 6 (six) hours as needed for wheezing or shortness of breath., Disp: 12 g, Rfl: 2 .  levothyroxine (SYNTHROID, LEVOTHROID) 25 MCG tablet, TAKE 1  TABLET BY MOUTH DAILY BEFORE BREAKFAST AND 1 EXTRA TABLET ON MONDAY, WEDNESDAY AND FRIDAY, Disp: 135 tablet, Rfl: 0 .  lisdexamfetamine (VYVANSE) 40 MG capsule, Take 1 capsule (40 mg total) by mouth every morning., Disp: 30 capsule, Rfl: 0 .  SUMAtriptan (IMITREX) 100 MG tablet, Take 1 tablet (100 mg total) by mouth as needed for migraine. May repeat in 2 hours if headache persists or recurs., Disp: 18 tablet, Rfl: 1 .  topiramate (TOPAMAX) 100 MG tablet, Take 1  tablet (100 mg total) by mouth 2 (two) times daily., Disp: 180 tablet, Rfl: 1 .  traMADol (ULTRAM) 50 MG tablet, Take 1 tablet by mouth 2 (two) times daily as needed., Disp: , Rfl:  .  valACYclovir (VALTREX) 500 MG tablet, TAKE 2 TABLETS BY MOUTH TWICE DAILY FOR 10 DAYS, Disp: 20 tablet, Rfl: 0 .  omeprazole (PRILOSEC) 40 MG capsule, Take 1 capsule (40 mg total) by mouth daily before breakfast for 30 days., Disp: 30 capsule, Rfl: 2    Family History  Problem Relation Age of Onset  . Colon cancer Father   . Breast cancer Sister   . Diverticulitis Brother   . Diverticulitis Brother      Social History   Tobacco Use  . Smoking status: Former Smoker    Packs/day: 1.00    Years: 25.00    Pack years: 25.00    Types: Cigarettes    Last attempt to quit: 05/25/2004    Years since quitting: 14.3  . Smokeless tobacco: Never Used  Substance Use Topics  . Alcohol use: No    Alcohol/week: 0.0 standard drinks  . Drug use: No    Allergies as of 09/14/2018 - Review Complete 09/14/2018  Allergen Reaction Noted  . Codeine  09/28/2014  . Pantoprazole  02/19/2015     Imaging Studies: Reviewed  Assessment and Plan:   Jane Patrick is a 58 y.o. pleasant female with obesity, chronic dysphagia secondary to benign esophageal stricture from GERD, hiatal hernia status post balloon dilation.  Symptoms improved since dilation of the esophageal stricture.  Taking omeprazole 20 mg about 3 times a week.  Patient reported having flulike symptoms on pantoprazole in the past.  She is tolerating over-the-counter omeprazole at present  Strongly advised her to adhere to omeprazole daily Recommend to try omeprazole 40 mg once a day before meals If she is able to tolerate it, recommend long-term Recommend EGD in next 2 to 3 months to assess esophageal stricture for dilation Advised her to lose weight   Follow Up Instructions:   I discussed the assessment and treatment plan with the patient. The patient  was provided an opportunity to ask questions and all were answered. The patient agreed with the plan and demonstrated an understanding of the instructions.   The patient was advised to call back or seek an in-person evaluation if the symptoms worsen or if the condition fails to improve as anticipated.  I provided 10 minutes of face-to-face time during this encounter.   Follow up in 3 months   Arlyss Repress, MD

## 2018-09-27 ENCOUNTER — Ambulatory Visit: Payer: 59 | Admitting: Gastroenterology

## 2018-10-03 ENCOUNTER — Encounter: Payer: Self-pay | Admitting: Family Medicine

## 2018-10-04 ENCOUNTER — Other Ambulatory Visit: Payer: Self-pay

## 2018-10-04 ENCOUNTER — Ambulatory Visit (INDEPENDENT_AMBULATORY_CARE_PROVIDER_SITE_OTHER): Payer: 59 | Admitting: Family Medicine

## 2018-10-04 ENCOUNTER — Encounter: Payer: Self-pay | Admitting: Family Medicine

## 2018-10-04 DIAGNOSIS — E559 Vitamin D deficiency, unspecified: Secondary | ICD-10-CM

## 2018-10-04 DIAGNOSIS — F900 Attention-deficit hyperactivity disorder, predominantly inattentive type: Secondary | ICD-10-CM

## 2018-10-04 DIAGNOSIS — E039 Hypothyroidism, unspecified: Secondary | ICD-10-CM | POA: Diagnosis not present

## 2018-10-04 DIAGNOSIS — J449 Chronic obstructive pulmonary disease, unspecified: Secondary | ICD-10-CM | POA: Diagnosis not present

## 2018-10-04 DIAGNOSIS — E538 Deficiency of other specified B group vitamins: Secondary | ICD-10-CM

## 2018-10-04 DIAGNOSIS — F321 Major depressive disorder, single episode, moderate: Secondary | ICD-10-CM

## 2018-10-04 DIAGNOSIS — E785 Hyperlipidemia, unspecified: Secondary | ICD-10-CM

## 2018-10-04 DIAGNOSIS — G43009 Migraine without aura, not intractable, without status migrainosus: Secondary | ICD-10-CM

## 2018-10-04 DIAGNOSIS — I700 Atherosclerosis of aorta: Secondary | ICD-10-CM

## 2018-10-04 MED ORDER — LISDEXAMFETAMINE DIMESYLATE 30 MG PO CAPS: 30.0000 mg | ORAL_CAPSULE | ORAL | 0 refills | Status: DC

## 2018-10-04 MED ORDER — LEVOTHYROXINE SODIUM 25 MCG PO TABS: ORAL_TABLET | ORAL | 0 refills | Status: DC

## 2018-10-04 MED ORDER — DULOXETINE HCL 60 MG PO CPEP: 60.0000 mg | ORAL_CAPSULE | Freq: Every day | ORAL | 0 refills | Status: DC

## 2018-10-04 MED ORDER — IPRATROPIUM-ALBUTEROL 20-100 MCG/ACT IN AERS: 1.0000 | INHALATION_SPRAY | Freq: Four times a day (QID) | RESPIRATORY_TRACT | 1 refills | Status: DC | PRN

## 2018-10-04 NOTE — Progress Notes (Signed)
Name: Jane Patrick   MRN: [id number]    DOB: 1960-11-01   Date:10/04/2018       Progress Note  Subjective  Chief Complaint  Chief Complaint  Patient presents with  . COPD  . Depression    I connected with  Jane Patrick  on 10/04/18 at  7:40 AM EDT by a video enabled telemedicine application and verified that I am speaking with the correct person using two identifiers.  I discussed the limitations of evaluation and management by telemedicine and the availability of in person appointments. The patient expressed understanding and agreed to proceed. Staff also discussed with the patient that there may be a patient responsible charge related to this service. Patient Location: at home  Provider Location: [facility]   HPI  Migraine headache: she is on Topamax for prevention, she states skipped some doses recently, usually triggered by barometric pressure changes.Imitrex helps with symptoms, takes about 45 minutes to resolve the symptoms and she feels groggy the following day. Symptoms are described as throbbing, usually temporal but can radiate to the entire head. It is associates with nausea and vomiting, at times associated with phonophobia and photophobia. She states episodes are remotely   Chronic back pain: under the care of Jane Patrick, taking Tramadol daily, pain level today is 3/10, symptoms are worse when standing or sitting too long.   Abnormal pap smear in 10/06/10: she was referred to [facility], and advised to see Dr. Mariann Patrick.  She had a TAH in 10-06-2002 for treatment of abnormal cervical cells. She was seen by Jane Patrick at [facility] and last pap done in Feb 2017 was normal with positive HPV , October 2017, she missed appointment 02/2017 , she lost her job and has to re-schedule that she has insurance.No vaginal bleeding or cramping. She has insurance again and is due for repeat pap smear, she did not contact gyn yet and has to wait until COVID-19 passes   Herpes  Type II: she had a small lesion when she was seen by Jane Patrick and culture was positive for herpes, she was given Valtrex, no other outbreaks since. She has medication at pharmacy   Hematuria: seen at [facility] in 10/06/2010 , had multiple CT, last one in Oct 06, 2010 and stable liver lesions no further evaluation, patient states also told the same by Urologist for negative hematuria evaluation . Unchanged   ADD: she lost her job May 2019,but is working again , company is in [place]. She was on Adderal because of cost, she is on Vyvanse but she states not sleeping well, she asked to try to decrease dose and monitor, seh will call back if 30 mg works well for her  COPD Mild: occasional SOB , no coughorwheezing unless she has a flare. Last flare was Dec 2019 took Zpack and tessalon perles. . No longer smoking - quit about 12years ago. She uses Combivent less often, at most 4 times weekly or less.   B12 deficiency: found in June 2016,currently only using SL B12 daily, reviewed labs with patient , it was back to normal range   Major Depression in partial Remission: on Pristiq andoff AlprazolamSymptoms started when husband was diagnosed with lung cancer and died in 05-Oct-2008.Currently  duloxetine, no side effects, however phq 9has gone up to 11  Hypothyroidism: daughterhasHypothyroidism, TSH was elevated times two, she is now on levothyroxine,last TSH at goal   Atherosclerosis of aorta: she prefers not taking rx medication, she will try red rice  yeast at home, Unchanged .   Dysphagia: history of esophageal stricture and had it dilated in the past ,she went to  esophageal stretch and is doing well now   Patient Active Problem List   Diagnosis Date Noted  . Esophageal dysphagia   . Adult hypothyroidism 08/15/2017  . Atherosclerosis of aorta (HCC) 09/22/2016  . Hiatal hernia 09/22/2016  . Abnormal liver CT 09/11/2016  . H/O herpes simplex type 2 infection 08/20/2015  . Hematuria 02/19/2015  . B12  deficiency 02/19/2015  . Attention deficit hyperactivity disorder (ADHD), predominantly inattentive type 11/13/2014  . COPD, mild (HCC) 11/13/2014  . Major depression in partial remission (HCC) 11/13/2014  . History of basal cell carcinoma 11/13/2014  . Gastroesophageal reflux disease without esophagitis 11/13/2014  . History of abnormal cervical Pap smear 11/13/2014  . Migraine without aura and without status migrainosus, not intractable 11/13/2014  . Vitamin D deficiency 11/13/2014  . Liver lesion 10/04/2010    Past Surgical History:  Procedure Laterality Date  . ABDOMINAL HYSTERECTOMY  2004   partial  . BREAST SURGERY     biopsies  . ESOPHAGOGASTRODUODENOSCOPY (EGD) WITH PROPOFOL N/A 08/02/2018   Procedure: ESOPHAGOGASTRODUODENOSCOPY (EGD) WITH PROPOFOL;  Surgeon: Jane Reil, MD;  Location: [facility] ENDOSCOPY;  Service: Gastroenterology;  Laterality: N/A;  . FRACTURE SURGERY     rode in femur  . POLYPECTOMY     vocal chord polyps    Family History  Problem Relation Age of Onset  . Colon cancer Father   . Breast cancer Sister   . Diverticulitis Brother   . Diverticulitis Brother     Social History   Socioeconomic History  . Marital status: Widowed    Spouse name: Not on file  . Number of children: 2  . Years of education: Not on file  . Highest education level: Associate degree: occupational, Scientist, product/process development, or vocational program  Occupational History  . Occupation: Doctor, general practice   Social Needs  . Financial resource strain: Not hard at all  . Food insecurity:    Worry: Never true    Inability: Never true  . Transportation needs:    Medical: No    Non-medical: No  Tobacco Use  . Smoking status: Former Smoker    Packs/day: 1.00    Years: 25.00    Pack years: 25.00    Types: Cigarettes    Last attempt to quit: 05/25/2004    Years since quitting: 14.3  . Smokeless tobacco: Never Used  Substance and Sexual Activity  . Alcohol use: No     Alcohol/week: 0.0 standard drinks  . Drug use: No  . Sexual activity: Yes    Partners: Male  Lifestyle  . Physical activity:    Days per week: 7 days    Minutes per session: 30 min  . Stress: Not at all  Relationships  . Social connections:    Talks on phone: More than three times a week    Gets together: More than three times a week    Attends religious service: Never    Active member of club or organization: Yes    Attends meetings of clubs or organizations: More than 4 times per year    Relationship status: Widowed  . Intimate partner violence:    Fear of current or ex partner: No    Emotionally abused: No    Physically abused: No    Forced sexual activity: No  Other Topics Concern  . Not on file  Social History  Narrative   Patient was laid off from May till Nov 2019, but is working again . Works from home for a [employer] company      Current Outpatient Medications:  .  cholecalciferol (VITAMIN D) 1000 UNITS tablet, Take 2,000 Units by mouth daily., Disp: , Rfl:  .  DULoxetine (CYMBALTA) 60 MG capsule, Take 1 capsule (60 mg total) by mouth daily., Disp: 90 capsule, Rfl: 0 .  ibuprofen (ADVIL,MOTRIN) 200 MG tablet, Take by mouth., Disp: , Rfl:  .  Ipratropium-Albuterol (COMBIVENT RESPIMAT) 20-100 MCG/ACT AERS respimat, Inhale 1 puff into the lungs every 6 (six) hours as needed for wheezing or shortness of breath., Disp: 12 g, Rfl: 2 .  levothyroxine (SYNTHROID, LEVOTHROID) 25 MCG tablet, TAKE 1 TABLET BY MOUTH DAILY BEFORE BREAKFAST AND 1 EXTRA TABLET ON MONDAY, WEDNESDAY AND FRIDAY, Disp: 135 tablet, Rfl: 0 .  lisdexamfetamine (VYVANSE) 40 MG capsule, Take 1 capsule (40 mg total) by mouth every morning., Disp: 30 capsule, Rfl: 0 .  omeprazole (PRILOSEC) 40 MG capsule, Take 1 capsule (40 mg total) by mouth daily before breakfast for 30 days., Disp: 30 capsule, Rfl: 2 .  SUMAtriptan (IMITREX) 100 MG tablet, Take 1 tablet (100 mg total) by mouth as needed for migraine. May repeat in 2  hours if headache persists or recurs., Disp: 18 tablet, Rfl: 1 .  topiramate (TOPAMAX) 100 MG tablet, Take 1 tablet (100 mg total) by mouth 2 (two) times daily., Disp: 180 tablet, Rfl: 1 .  traMADol (ULTRAM) 50 MG tablet, Take 1 tablet by mouth 2 (two) times daily as needed., Disp: , Rfl:  .  valACYclovir (VALTREX) 500 MG tablet, TAKE 2 TABLETS BY MOUTH TWICE DAILY FOR 10 DAYS, Disp: 20 tablet, Rfl: 0  Allergies  Allergen Reactions  . Codeine   . Pantoprazole     Flu like syptoms    I personally reviewed active problem list, medication list, allergies, family history with the patient/caregiver today.   ROS  Ten systems reviewed and is negative except as mentioned in HPI   Objective  Virtual encounter, vitals not obtained.  There is no height or weight on file to calculate BMI.  Physical Exam  Alert, awake and oriented  PHQ2/9: Depression screen [facility] 2/[facility] 10/04/2018 10/03/2018 07/01/2018 05/09/2018 03/23/2018  Decreased Interest 0 0 - 0 0  Down, Depressed, Hopeless 0 0 - 0 1  PHQ - 2 Score 0 0 - 0 1  Altered sleeping 3 3 0 0 1  Tired, decreased energy 3 3 1 3 3   Change in appetite 3 3 1 1  0  Feeling bad or failure about yourself  1 1 0 0 0  Trouble concentrating 1 1 0 0 1  Moving slowly or fidgety/restless 0 0 0 0 0  Suicidal thoughts 0 0 0 0 0  PHQ-9 Score 11 11 - 4 6  Difficult doing work/chores Not difficult at all Not difficult at all - Not difficult at all Not difficult at all  Some recent data might be hidden   PHQ-2/9 Result is positive.    Fall Risk: Fall Risk  10/04/2018 10/03/2018 07/01/2018 05/09/2018 03/23/2018  Falls in the past year? 0 0 0 0 No  Number falls in past yr: 0 0 0 0 -  Injury with Fall? 0 0 0 0 -  Follow up - - Falls evaluation completed - -     Assessment & Plan  1. Attention deficit hyperactivity disorder (ADHD), predominantly inattentive type  We will decrease  dose of vyvanse to see if she sleeps better - lisdexamfetamine (VYVANSE) 30 MG  capsule; Take 1 capsule (30 mg total) by mouth every morning.  Dispense: 30 capsule; Refill: 0  2. B12 deficiency  At goal   3. COPD, mild (HCC)  - Ipratropium-Albuterol (COMBIVENT RESPIMAT) 20-100 MCG/ACT AERS respimat; Inhale 1 puff into the lungs every 6 (six) hours as needed for wheezing or shortness of breath.  Dispense: 12 g; Refill: 1  4. Hypothyroidism in adult  - levothyroxine (SYNTHROID) 25 MCG tablet; One a day and two on M,W, F  Dispense: 135 tablet; Refill: 0  5. Atherosclerosis of aorta (HCC)  Refuses statin therapy   6. Migraine without aura and without status migrainosus, not intractable  Doing well at this time  7. Dyslipidemia  The 10-year ASCVD risk score Denman George DC Montez Hageman., et al., 2013) is: 2.6%   Values used to calculate the score:     Age: 27 years     Sex: Female     Is Non-Hispanic African American: No     Diabetic: No     Tobacco smoker: No     Systolic Blood Pressure: 119 mmHg     Is BP treated: No     HDL Cholesterol: 47 mg/dL     Total Cholesterol: 234 mg/dL  8. Vitamin D deficiency   9. Moderate major depression (HCC)  She does not want to change regiment, states likely because she cannot sleep - DULoxetine (CYMBALTA) 60 MG capsule; Take 1 capsule (60 mg total) by mouth daily.  Dispense: 90 capsule; Refill: 0  I discussed the assessment and treatment plan with the patient. The patient was provided an opportunity to ask questions and all were answered. The patient agreed with the plan and demonstrated an understanding of the instructions.  The patient was advised to call back or seek an in-person evaluation if the symptoms worsen or if the condition fails to improve as anticipated.  I provided 25 minutes of non-face-to-face time during this encounter.

## 2018-11-14 ENCOUNTER — Other Ambulatory Visit: Payer: Self-pay | Admitting: Family Medicine

## 2018-11-14 DIAGNOSIS — F900 Attention-deficit hyperactivity disorder, predominantly inattentive type: Secondary | ICD-10-CM

## 2018-11-15 MED ORDER — LISDEXAMFETAMINE DIMESYLATE 30 MG PO CAPS: 30.0000 mg | ORAL_CAPSULE | ORAL | 0 refills | Status: DC

## 2018-11-15 NOTE — Telephone Encounter (Signed)
Patient states the Vyvanse 30 mg is working well for her and that is why she sent in the refill request.

## 2018-12-01 ENCOUNTER — Other Ambulatory Visit: Payer: Self-pay | Admitting: Family Medicine

## 2018-12-01 DIAGNOSIS — F321 Major depressive disorder, single episode, moderate: Secondary | ICD-10-CM

## 2018-12-09 ENCOUNTER — Encounter: Payer: Self-pay | Admitting: Gastroenterology

## 2018-12-15 ENCOUNTER — Other Ambulatory Visit: Payer: Self-pay

## 2018-12-15 ENCOUNTER — Ambulatory Visit (INDEPENDENT_AMBULATORY_CARE_PROVIDER_SITE_OTHER): Payer: 59 | Admitting: Gastroenterology

## 2018-12-15 DIAGNOSIS — Z9889 Other specified postprocedural states: Secondary | ICD-10-CM | POA: Diagnosis not present

## 2018-12-15 NOTE — Progress Notes (Signed)
Jane Donathohini Lowen Barringer, MD [address]  [address]  [place], [place] [address]  Main: [phone]  Fax: [phone]    Gastroenterology follow-up video Visit  Referring Provider:     Alba CorySowles, Krichna, MD Primary Care Physician:  Alba CorySowles, Krichna, MD Primary Gastroenterologist:  Dr. Arlyss Repressohini R Marland Reine Reason for Consultation:     Dysphagia, esophageal stricture        HPI:   Jane Patrick is a 58 y.o. female referred by Dr. Alba CorySowles, Krichna, MD  for consultation & management of esophageal stricture  Virtual Visit Video Note  I connected with Jane Patrick on 12/15/18 at  1:30 PM EDT by video and verified that I am speaking with the correct person using two identifiers.   I discussed the limitations, risks, security and privacy concerns of performing an evaluation and management service by video and the availability of in person appointments. I also discussed with the patient that there may be a patient responsible charge related to this service. The patient expressed understanding and agreed to proceed.  Location of the Patient: Home  Location of the provider: Home office   History of Present Illness: Initially saw Jane Patrick in early March 2020 for chronic dysphagia to solids.  She was taking Goody powder for headaches.  Subsequently, she underwent EGD which revealed benign esophageal stricture, hiatal hernia and Cameron ulcers, stricture was dilated to 15 mm.  She reports that since then she has been tolerating solid foods well other than one episode of dysphagia which she thinks she might have had swallowed large piece.  She is trying to take omeprazole 20 mg OTC about 3 times a week only.  She stopped taking Goody powder.  She feels well overall.   Follow up visit 12/15/18:  She is tolerating omeprazole 40mg  daily well. Denies any upper GI symptoms including dysphagia, heart burn  NSAIDs: Goody powder for headache in the past, stopped in 07/2018  Antiplts/Anticoagulants/Anti  thrombotics: None  GI Procedures:  EGD and colonoscopy ~ 5 years ago in [place] by Dr Servando SnareWohl Was told she needs screening colon in 10years Father with some type of GI malignancy, not alive  EGD 08/02/2018 - Normal duodenal bulb and second portion of the duodenum. - 1 cm hiatal hernia with a few Cameron ulcers. - Benign-appearing esophageal stenosis. Dilated. - No specimens collected.  Past Medical History:  Diagnosis Date  . Allergy   . Asthma   . COPD (chronic obstructive pulmonary disease) (HCC)   . Generalized headaches   . GERD (gastroesophageal reflux disease)     Past Surgical History:  Procedure Laterality Date  . ABDOMINAL HYSTERECTOMY  2004   partial  . BREAST SURGERY     biopsies  . ESOPHAGOGASTRODUODENOSCOPY (EGD) WITH PROPOFOL N/A 08/02/2018   Procedure: ESOPHAGOGASTRODUODENOSCOPY (EGD) WITH PROPOFOL;  Surgeon: Toney ReilVanga, Billie Intriago Reddy, MD;  [facility];  Service: Gastroenterology;  Laterality: N/A;  . FRACTURE SURGERY     rode in femur  . POLYPECTOMY     vocal chord polyps    Current Outpatient Medications:  .  cholecalciferol (VITAMIN D) 1000 UNITS tablet, Take 2,000 Units by mouth daily., Disp: , Rfl:  .  DULoxetine (CYMBALTA) 60 MG capsule, Take 1 capsule (60 mg total) by mouth daily., Disp: 90 capsule, Rfl: 0 .  ibuprofen (ADVIL,MOTRIN) 200 MG tablet, Take by mouth., Disp: , Rfl:  .  Ipratropium-Albuterol (COMBIVENT RESPIMAT) 20-100 MCG/ACT AERS respimat, Inhale 1 puff into the lungs every 6 (six) hours as  needed for wheezing or shortness of breath., Disp: 12 g, Rfl: 1 .  levothyroxine (SYNTHROID) 25 MCG tablet, One a day and two on M,W, F, Disp: 135 tablet, Rfl: 0 .  lisdexamfetamine (VYVANSE) 30 MG capsule, Take 1 capsule (30 mg total) by mouth every morning., Disp: 30 capsule, Rfl: 0 .  SUMAtriptan (IMITREX) 100 MG tablet, Take 1 tablet (100 mg total) by mouth as needed for migraine. May repeat in 2 hours if headache persists or recurs., Disp: 18  tablet, Rfl: 1 .  topiramate (TOPAMAX) 100 MG tablet, Take 1 tablet (100 mg total) by mouth 2 (two) times daily., Disp: 180 tablet, Rfl: 1 .  traMADol (ULTRAM) 50 MG tablet, Take 1 tablet by mouth 2 (two) times daily as needed., Disp: , Rfl:  .  valACYclovir (VALTREX) 500 MG tablet, TAKE 2 TABLETS BY MOUTH TWICE DAILY FOR 10 DAYS, Disp: 20 tablet, Rfl: 0 .  omeprazole (PRILOSEC) 40 MG capsule, Take 1 capsule (40 mg total) by mouth daily before breakfast for 30 days., Disp: 30 capsule, Rfl: 2 .  predniSONE (DELTASONE) 10 MG tablet, , Disp: , Rfl:     Family History  Problem Relation Age of Onset  . Colon cancer Father   . Breast cancer Sister   . Diverticulitis Brother   . Diverticulitis Brother      Social History   Tobacco Use  . Smoking status: Former Smoker    Packs/day: 1.00    Years: 25.00    Pack years: 25.00    Types: Cigarettes    Quit date: 05/25/2004    Years since quitting: 14.5  . Smokeless tobacco: Never Used  Substance Use Topics  . Alcohol use: No    Alcohol/week: 0.0 standard drinks  . Drug use: No    Allergies as of 12/15/2018 - Review Complete 12/15/2018  Allergen Reaction Noted  . Codeine  09/28/2014  . Pantoprazole  02/19/2015     Imaging Studies: Reviewed  Assessment and Plan:   Jane Patrick is a 58 y.o. pleasant female with obesity, chronic dysphagia secondary to benign esophageal stricture from GERD, hiatal hernia status post balloon dilation.  Symptoms improved since dilation of the esophageal stricture.  Taking omeprazole 20 mg about 3 times a week.  Patient reported having flulike symptoms on pantoprazole in the past.  She is tolerating over-the-counter omeprazole at present  Recommend to continue omeprazole 40 mg once a day before meals She would like to defer EGD at this time    Follow Up Instructions:   I discussed the assessment and treatment plan with the patient. The patient was provided an opportunity to ask questions and all were  answered. The patient agreed with the plan and demonstrated an understanding of the instructions.   The patient was advised to call back or seek an in-person evaluation if the symptoms worsen or if the condition fails to improve as anticipated.  I provided 10 minutes of face-to-face time during this encounter.   Follow up in 6 months   Cephas Darby, MD

## 2018-12-20 ENCOUNTER — Other Ambulatory Visit: Payer: Self-pay | Admitting: Family Medicine

## 2018-12-20 DIAGNOSIS — F900 Attention-deficit hyperactivity disorder, predominantly inattentive type: Secondary | ICD-10-CM

## 2018-12-20 MED ORDER — LISDEXAMFETAMINE DIMESYLATE 30 MG PO CAPS: 30.0000 mg | ORAL_CAPSULE | ORAL | 0 refills | Status: DC

## 2018-12-20 NOTE — Telephone Encounter (Signed)
Refill request for general medication. Vyvanse to [facility].  Last office visit 10/04/2018   Follow up on 01/04/2019

## 2018-12-29 ENCOUNTER — Other Ambulatory Visit: Payer: Self-pay | Admitting: Gastroenterology

## 2018-12-29 ENCOUNTER — Other Ambulatory Visit: Payer: Self-pay | Admitting: Family Medicine

## 2018-12-29 DIAGNOSIS — E039 Hypothyroidism, unspecified: Secondary | ICD-10-CM

## 2018-12-29 DIAGNOSIS — Z9889 Other specified postprocedural states: Secondary | ICD-10-CM

## 2019-01-04 ENCOUNTER — Encounter: Payer: Self-pay | Admitting: Family Medicine

## 2019-01-04 ENCOUNTER — Ambulatory Visit (INDEPENDENT_AMBULATORY_CARE_PROVIDER_SITE_OTHER): Payer: 59 | Admitting: Family Medicine

## 2019-01-04 VITALS — Wt 165.0 lb

## 2019-01-04 DIAGNOSIS — I700 Atherosclerosis of aorta: Secondary | ICD-10-CM

## 2019-01-04 DIAGNOSIS — F900 Attention-deficit hyperactivity disorder, predominantly inattentive type: Secondary | ICD-10-CM | POA: Diagnosis not present

## 2019-01-04 DIAGNOSIS — F321 Major depressive disorder, single episode, moderate: Secondary | ICD-10-CM

## 2019-01-04 DIAGNOSIS — E039 Hypothyroidism, unspecified: Secondary | ICD-10-CM

## 2019-01-04 DIAGNOSIS — K219 Gastro-esophageal reflux disease without esophagitis: Secondary | ICD-10-CM

## 2019-01-04 DIAGNOSIS — G43009 Migraine without aura, not intractable, without status migrainosus: Secondary | ICD-10-CM | POA: Diagnosis not present

## 2019-01-04 DIAGNOSIS — J449 Chronic obstructive pulmonary disease, unspecified: Secondary | ICD-10-CM

## 2019-01-04 MED ORDER — DULOXETINE HCL 60 MG PO CPEP: 60.0000 mg | ORAL_CAPSULE | Freq: Every day | ORAL | 0 refills | Status: DC

## 2019-01-04 MED ORDER — SUMATRIPTAN SUCCINATE 100 MG PO TABS: 100.0000 mg | ORAL_TABLET | ORAL | 1 refills | Status: DC | PRN

## 2019-01-04 MED ORDER — TOPIRAMATE 100 MG PO TABS: 100.0000 mg | ORAL_TABLET | Freq: Two times a day (BID) | ORAL | 1 refills | Status: DC

## 2019-01-04 MED ORDER — LISDEXAMFETAMINE DIMESYLATE 40 MG PO CAPS: 40.0000 mg | ORAL_CAPSULE | ORAL | 0 refills | Status: DC

## 2019-01-04 NOTE — Progress Notes (Signed)
Name: Jane Patrick   MRN: [id number]    DOB: 07/24/1960   Date:01/04/2019       Progress Note  Subjective  Chief Complaint  Chief Complaint  Patient presents with  . Follow-up    3 month follow up    I connected with  Jane Patrick  on 01/04/19 at  7:40 AM EDT by a video enabled telemedicine application and verified that I am speaking with the correct person using two identifiers.  I discussed the limitations of evaluation and management by telemedicine and the availability of in person appointments. The patient expressed understanding and agreed to proceed. Staff also discussed with the patient that there may be a patient responsible charge related to this service. Patient Location: at home  [facility]   HPI  Migraine headache: she is on Topamax for prevention,  usually triggered by barometric pressure changes.Imitrex helps with symptoms, takes about 45 minutes to resolve the symptoms and she feels groggy the following day. Symptoms are described as throbbing, usually temporal but can radiate to the entire head. It is associates with nausea and vomiting, at times associated with phonophobia and photophobia. She states she had a couple episodes since her last visit with me  Chronic back pain: under the care of Dr. Council Mechanichasniss, taking Tramadol daily, pain level today is 0/10, symptoms are worse when standing or sitting too long. Recently a flare and treated with prednisone but afraid of getting MRI   Abnormal pap smear in 2012: [facility], and advised to see Dr. Mariann Barterahangdale.  She had a TAH in 2004 for treatment of abnormal cervical cells. She was seen by [facility] Dal[facility]Be[facility] and last pap done in Feb 2017 was normal with positive HPV , October 2017, she missed appointment 02/2017 , she lost her job and has to re-schedule that she has insurance.No vaginal bleeding or cramping. She has insurance again and is due for repeat pap smear,  she promised me to call GYN today   Herpes Type II: she had a small lesion when she was seen by Dr. Dalbert GarnetBeasley and culture was positive for herpes, she was given Valtrex, no other outbreaks since. She has medication at home, no need for refills today   Hematuria: [facility], had multiple CT, last one in 2012 and stable liver lesions no further evaluation, patient states also told the same by Urologist for negative hematuria evaluation. Same  ADD: she lost her job May 2019,but is working again,  The current , company is in [place] , she has been working for them since Nov 2019 . She is now on Vyvanse 30 mg but does not seem to be working well for her we will try going up again to 40 mg and see how she responds   COPD Mild: occasional SOB , no coughorwheezing unless she has a flare. Last flare was Dec 2019 took Zpack and tessalon perles.. No longer smoking - quit about 12years ago. She uses Combivent prn only and does not need refills today   B12 deficiency: found in June 2016,currently only using SL B12 daily, reviewed labs with patient , it was back to normal range . Continue supplements   Major Depression in partial Remission: on Pristiq andoff AlprazolamSymptoms started when husband was diagnosed with lung cancer and died in 2010.Currentlyduloxetine, no side effects, Phq 9 is still positive but down from 11 to 6   Hypothyroidism: daughterhasHypothyroidism, last TSH  was back to normal, we will continue to monitor   Atherosclerosis of aorta: she prefers not taking rx medication, she will try red rice yeast at home. Unchanged  Dysphagia: seen by Dr. Allegra LaiVanga and had dilation done 08/2018 and is doing well since No dysphagia . She is also taking     Patient Active Problem List   Diagnosis Date Noted  . Esophageal dysphagia   . Adult hypothyroidism 08/15/2017  . Atherosclerosis of aorta (HCC) 09/22/2016  . Hiatal hernia 09/22/2016  . Abnormal liver CT 09/11/2016   . H/O herpes simplex type 2 infection 08/20/2015  . Hematuria 02/19/2015  . B12 deficiency 02/19/2015  . Attention deficit hyperactivity disorder (ADHD), predominantly inattentive type 11/13/2014  . COPD, mild (HCC) 11/13/2014  . Major depression in partial remission (HCC) 11/13/2014  . History of basal cell carcinoma 11/13/2014  . Gastroesophageal reflux disease without esophagitis 11/13/2014  . History of abnormal cervical Pap smear 11/13/2014  . Migraine without aura and without status migrainosus, not intractable 11/13/2014  . Vitamin D deficiency 11/13/2014  . Liver lesion 10/04/2010    Past Surgical History:  Procedure Laterality Date  . ABDOMINAL HYSTERECTOMY  2004   partial  . BREAST SURGERY     biopsies  . ESOPHAGOGASTRODUODENOSCOPY (EGD) WITH PROPOFOL N/A 08/02/2018   Procedure: ESOPHAGOGASTRODUODENOSCOPY (EGD) WITH PROPOFOL;  Surgeon: Toney ReilVanga, Rohini Reddy, MD;  [facility];  Service: Gastroenterology;  Laterality: N/A;  . FRACTURE SURGERY     rode in femur  . POLYPECTOMY     vocal chord polyps    Family History  Problem Relation Age of Onset  . Colon cancer Father   . Breast cancer Sister   . Diverticulitis Brother   . Diverticulitis Brother     Social History   Socioeconomic History  . Marital status: Widowed    Spouse name: Not on file  . Number of children: 2  . Years of education: Not on file  . Highest education level: Associate degree: occupational, Scientist, product/process developmenttechnical, or vocational program  Occupational History  . Occupation: Doctor, general practiceAssistant Vice President   Social Needs  . Financial resource strain: Not hard at all  . Food insecurity    Worry: Never true    Inability: Never true  . Transportation needs    Medical: No    Non-medical: No  Tobacco Use  . Smoking status: Former Smoker    Packs/day: 1.00    Years: 25.00    Pack years: 25.00    Types: Cigarettes    Quit date: 05/25/2004    Years since quitting: 14.6  . Smokeless tobacco: Never  Used  Substance and Sexual Activity  . Alcohol use: No    Alcohol/week: 0.0 standard drinks  . Drug use: No  . Sexual activity: Yes    Partners: Male  Lifestyle  . Physical activity    Days per week: 7 days    Minutes per session: 30 min  . Stress: Not at all  Relationships  . Social connections    Talks on phone: More than three times a week    Gets together: More than three times a week    Attends religious service: Never    Active member of club or organization: Yes    Attends meetings of clubs or organizations: More than 4 times per year    Relationship status: Widowed  . Intimate partner violence    Fear of current or ex partner: No    Emotionally abused: No  Physically abused: No    Forced sexual activity: No  Other Topics Concern  . Not on file  Social History Narrative   Patient was laid off from May till Nov 2019, but is working again . Works from home for a [employer] company      Current Outpatient Medications:  .  cholecalciferol (VITAMIN D) 1000 UNITS tablet, Take 2,000 Units by mouth daily., Disp: , Rfl:  .  DULoxetine (CYMBALTA) 60 MG capsule, Take 1 capsule (60 mg total) by mouth daily., Disp: 90 capsule, Rfl: 0 .  ibuprofen (ADVIL,MOTRIN) 200 MG tablet, Take by mouth., Disp: , Rfl:  .  Ipratropium-Albuterol (COMBIVENT RESPIMAT) 20-100 MCG/ACT AERS respimat, Inhale 1 puff into the lungs every 6 (six) hours as needed for wheezing or shortness of breath., Disp: 12 g, Rfl: 1 .  levothyroxine (SYNTHROID) 25 MCG tablet, TAKE 1 TABLET BY MOUTH ON TUESDAY, THURSDAY,SATURDAY,SUNDAY AND 2 TABLETS ON MONDAY, WEDNESDAY AND FRIDAY, Disp: 135 tablet, Rfl: 1 .  lisdexamfetamine (VYVANSE) 30 MG capsule, Take 1 capsule (30 mg total) by mouth every morning., Disp: 30 capsule, Rfl: 0 .  omeprazole (PRILOSEC) 40 MG capsule, TAKE ONE CAPSULE BY MOUTH DAILY BEFORE BREAKFAST FOR 30 DAYS., Disp: 30 capsule, Rfl: 2 .  predniSONE (DELTASONE) 10 MG tablet, , Disp: , Rfl:  .  SUMAtriptan  (IMITREX) 100 MG tablet, Take 1 tablet (100 mg total) by mouth as needed for migraine. May repeat in 2 hours if headache persists or recurs., Disp: 18 tablet, Rfl: 1 .  topiramate (TOPAMAX) 100 MG tablet, Take 1 tablet (100 mg total) by mouth 2 (two) times daily., Disp: 180 tablet, Rfl: 1 .  traMADol (ULTRAM) 50 MG tablet, Take 1 tablet by mouth 2 (two) times daily as needed., Disp: , Rfl:  .  valACYclovir (VALTREX) 500 MG tablet, TAKE 2 TABLETS BY MOUTH TWICE DAILY FOR 10 DAYS, Disp: 20 tablet, Rfl: 0  Allergies  Allergen Reactions  . Codeine   . Pantoprazole     Flu like syptoms    I personally reviewed active problem list, medication list, allergies, family history, social history with the patient/caregiver today.   ROS  Constitutional: Negative for fever or weight change.  Respiratory: Negative for cough and shortness of breath.   Cardiovascular: Negative for chest pain or palpitations.  Gastrointestinal: Negative for abdominal pain, no bowel changes.  Musculoskeletal: Negative for gait problem or joint swelling.  Skin: Negative for rash.  Neurological: Negative for dizziness , positive for intermittent  headache.  No other specific complaints in a complete review of systems (except as listed in HPI above).  Objective  Virtual encounter  Vitals:   01/04/19 0721  Weight: 165 lb (74.8 kg)   Body mass index is 30.18 kg/m.  Physical Exam  Awake, alert and oriented  PHQ2/9: Depression screen [facility] 01/04/2019 10/04/2018 10/03/2018 07/01/2018 05/09/2018  Decreased Interest 0 0 0 - 0  Down, Depressed, Hopeless 0 0 0 - 0  PHQ - 2 Score 0 0 0 - 0  Altered sleeping 3 3 3  0 0  Tired, decreased energy 3 3 3 1 3   Change in appetite 0 3 3 1 1   Feeling bad or failure about yourself  0 1 1 0 0  Trouble concentrating 0 1 1 0 0  Moving slowly or fidgety/restless 0 0 0 0 0  Suicidal thoughts 0 0 0 0 0  PHQ-9 Score 6 11 11  - 4  Difficult doing work/chores Not difficult at all Not  difficult at all Not difficult at all - Not difficult at all  Some recent data might be hidden   PHQ-2/9 Result is positive.    Fall Risk: Fall Risk  01/04/2019 10/04/2018 10/03/2018 07/01/2018 05/09/2018  Falls in the past year? 1 0 0 0 0  Number falls in past yr: 0 0 0 0 0  Comment tripped over dog and hurt left hand (per pt not major) - - - -  Injury with Fall? 1 0 0 0 0  Follow up Falls evaluation completed - - Falls evaluation completed -     Assessment & Plan  1. Migraine without aura and without status migrainosus, not intractable  - topiramate (TOPAMAX) 100 MG tablet; Take 1 tablet (100 mg total) by mouth 2 (two) times daily.  Dispense: 180 tablet; Refill: 1 - SUMAtriptan (IMITREX) 100 MG tablet; Take 1 tablet (100 mg total) by mouth as needed for migraine. May repeat in 2 hours if headache persists or recurs.  Dispense: 18 tablet; Refill: 1  2. Moderate major depression (HCC)  - DULoxetine (CYMBALTA) 60 MG capsule; Take 1 capsule (60 mg total) by mouth daily.  Dispense: 90 capsule; Refill: 0  3. Attention deficit hyperactivity disorder (ADHD), predominantly inattentive type  - lisdexamfetamine (VYVANSE) 40 MG capsule; Take 1 capsule (40 mg total) by mouth every morning.  Dispense: 30 capsule; Refill: 0  4. COPD, mild ([place])   5. Hypothyroidism in adult   6. Atherosclerosis of aorta (HCC)  Refuses statins  7. GERD without esophagitis  Doing well since esophageal dilation, seeing Dr. Marius Ditch and taking Omeprazole as prescribed   I discussed the assessment and treatment plan with the patient. The patient was provided an opportunity to ask questions and all were answered. The patient agreed with the plan and demonstrated an understanding of the instructions.  The patient was advised to call back or seek an in-person evaluation if the symptoms worsen or if the condition fails to improve as anticipated.  I provided 25 minutes of non-face-to-face time during this encounter.

## 2019-02-21 ENCOUNTER — Other Ambulatory Visit: Payer: Self-pay | Admitting: Family Medicine

## 2019-02-21 DIAGNOSIS — F900 Attention-deficit hyperactivity disorder, predominantly inattentive type: Secondary | ICD-10-CM

## 2019-02-21 MED ORDER — LISDEXAMFETAMINE DIMESYLATE 40 MG PO CAPS: 40.0000 mg | ORAL_CAPSULE | ORAL | 0 refills | Status: DC

## 2019-03-07 ENCOUNTER — Other Ambulatory Visit: Payer: Self-pay

## 2019-03-07 DIAGNOSIS — Z9889 Other specified postprocedural states: Secondary | ICD-10-CM

## 2019-03-07 MED ORDER — OMEPRAZOLE 40 MG PO CPDR: DELAYED_RELEASE_CAPSULE | ORAL | 0 refills | Status: DC

## 2019-03-20 ENCOUNTER — Other Ambulatory Visit: Payer: Self-pay | Admitting: Physical Medicine and Rehabilitation

## 2019-03-20 DIAGNOSIS — M5416 Radiculopathy, lumbar region: Secondary | ICD-10-CM

## 2019-03-26 ENCOUNTER — Ambulatory Visit
Admission: RE | Admit: 2019-03-26 | Discharge: 2019-03-26 | Disposition: A | Discharge status: Home / Self Care | Payer: 59 | Source: Ambulatory Visit | Attending: Physical Medicine and Rehabilitation | Admitting: Physical Medicine and Rehabilitation

## 2019-03-26 ENCOUNTER — Ambulatory Visit
Admission: RE | Admit: 2019-03-26 | Discharge: 2019-03-26 | Disposition: A | Discharge status: Home / Self Care | Payer: 59 | Attending: Physical Medicine and Rehabilitation | Admitting: Physical Medicine and Rehabilitation

## 2019-03-26 ENCOUNTER — Other Ambulatory Visit: Payer: Self-pay | Admitting: Physical Medicine and Rehabilitation

## 2019-03-26 DIAGNOSIS — Z0189 Encounter for other specified special examinations: Secondary | ICD-10-CM

## 2019-03-26 DIAGNOSIS — Z1389 Encounter for screening for other disorder: Secondary | ICD-10-CM

## 2019-03-30 ENCOUNTER — Other Ambulatory Visit: Payer: Self-pay

## 2019-03-30 ENCOUNTER — Ambulatory Visit
Admission: RE | Admit: 2019-03-30 | Discharge: 2019-03-30 | Disposition: A | Discharge status: Home / Self Care | Payer: 59 | Source: Ambulatory Visit | Attending: Physical Medicine and Rehabilitation | Admitting: Physical Medicine and Rehabilitation

## 2019-03-30 DIAGNOSIS — M5416 Radiculopathy, lumbar region: Secondary | ICD-10-CM

## 2019-04-11 ENCOUNTER — Encounter: Payer: Self-pay | Admitting: Family Medicine

## 2019-04-11 ENCOUNTER — Ambulatory Visit (INDEPENDENT_AMBULATORY_CARE_PROVIDER_SITE_OTHER): Payer: 59 | Admitting: Family Medicine

## 2019-04-11 ENCOUNTER — Other Ambulatory Visit: Payer: Self-pay

## 2019-04-11 DIAGNOSIS — F900 Attention-deficit hyperactivity disorder, predominantly inattentive type: Secondary | ICD-10-CM

## 2019-04-11 DIAGNOSIS — F321 Major depressive disorder, single episode, moderate: Secondary | ICD-10-CM | POA: Diagnosis not present

## 2019-04-11 DIAGNOSIS — Z9889 Other specified postprocedural states: Secondary | ICD-10-CM

## 2019-04-11 DIAGNOSIS — K219 Gastro-esophageal reflux disease without esophagitis: Secondary | ICD-10-CM

## 2019-04-11 DIAGNOSIS — J449 Chronic obstructive pulmonary disease, unspecified: Secondary | ICD-10-CM | POA: Diagnosis not present

## 2019-04-11 DIAGNOSIS — E039 Hypothyroidism, unspecified: Secondary | ICD-10-CM

## 2019-04-11 DIAGNOSIS — E785 Hyperlipidemia, unspecified: Secondary | ICD-10-CM

## 2019-04-11 DIAGNOSIS — Z8619 Personal history of other infectious and parasitic diseases: Secondary | ICD-10-CM

## 2019-04-11 DIAGNOSIS — G4700 Insomnia, unspecified: Secondary | ICD-10-CM

## 2019-04-11 DIAGNOSIS — I700 Atherosclerosis of aorta: Secondary | ICD-10-CM

## 2019-04-11 DIAGNOSIS — G43009 Migraine without aura, not intractable, without status migrainosus: Secondary | ICD-10-CM

## 2019-04-11 MED ORDER — DULOXETINE HCL 60 MG PO CPEP: 60.0000 mg | ORAL_CAPSULE | Freq: Every day | ORAL | 0 refills | Status: DC

## 2019-04-11 MED ORDER — LISDEXAMFETAMINE DIMESYLATE 40 MG PO CAPS: 40.0000 mg | ORAL_CAPSULE | ORAL | 0 refills | Status: DC

## 2019-04-11 MED ORDER — LEVOTHYROXINE SODIUM 25 MCG PO TABS: ORAL_TABLET | ORAL | 0 refills | Status: DC

## 2019-04-11 MED ORDER — HYDROXYZINE HCL 10 MG PO TABS: 10.0000 mg | ORAL_TABLET | Freq: Every day | ORAL | 0 refills | Status: DC

## 2019-04-11 MED ORDER — VALACYCLOVIR HCL 500 MG PO TABS: ORAL_TABLET | ORAL | 0 refills | Status: DC

## 2019-04-11 MED ORDER — ANORO ELLIPTA 62.5-25 MCG/INH IN AEPB: 1.0000 | INHALATION_SPRAY | Freq: Every day | RESPIRATORY_TRACT | 2 refills | Status: DC

## 2019-04-11 MED ORDER — UBRELVY 100 MG PO TABS: 1.0000 | ORAL_TABLET | Freq: Every day | ORAL | 2 refills | Status: DC | PRN

## 2019-04-11 MED ORDER — OMEPRAZOLE 40 MG PO CPDR: DELAYED_RELEASE_CAPSULE | ORAL | 0 refills | Status: DC

## 2019-04-11 NOTE — Progress Notes (Signed)
Name: Jane Patrick   MRN: [id number]    DOB: 1960/09/14   Date:04/11/2019       Progress Note  Subjective  Chief Complaint  Chief Complaint  Patient presents with  . ADHD  . COPD  . Depression  . Hypothyroidism    I connected with  [name]  on 04/11/19 at  8:20 AM EST by a video enabled telemedicine application and verified that I am speaking with the correct person using two identifiers.  I discussed the limitations of evaluation and management by telemedicine and the availability of in person appointments. The patient expressed understanding and agreed to proceed. Staff also discussed with the patient that there may be a patient responsible charge related to this service. Patient Location: at home  Provider Location: [facility]   HPI  Chronic back pain: : she is seeing [name] and had MRI done 03/30/2019, and showed L2- 3  Disc bulge , and also a hypointense bone lesion that they think it is a hemangioma but needs to have it repeat in 3 months. She continues to have daily pain, she is still taking Tramadol Pain level at this time is 4/10. She states when standing to cook, or walking when she goes groceries shopping   Migraine headache: she is on Topamax for prevention,  usually triggered by barometric pressure changes.Imitrex helps with symptoms, takes about 45 minutes to resolve the symptoms and she feels groggy the following day. Symptoms are described as throbbing, usually temporal but can radiate to the entire head. It is associates with nausea and vomiting, at times associated with phonophobia and photophobia. She had a few episodes since last visit. Discussed Ubrelvy.    Abnormal pap smear in 2010/09/25: she was referred to [facility], and advised to see [name].  She had a TAH in 09-25-2002 for treatment of abnormal cervical cells. She was seen by [name] at [facility] and last pap done in Feb 2017 was normal with positive HPV , October 2017, she  missed appointment 02/2017 , she lost her job and has to re-schedule that she has insurance.No vaginal bleeding or cramping. She has insurance again and is due for repeat pap smear, she will see gyn in Dec 2020, already scheduled    Herpes Type II: she had a small lesion when she was seen by [name] and culture was positive for herpes, she was given Valtrex, no other outbreaks since. She has medication at home, she needs refill of Valtrex   Hematuria: seen at [facility] in Sep 25, 2010, had multiple CT, last one in 09/25/10 and stable liver lesions no further evaluation, patient states also told the same by Urologist for negative hematuria evaluation. Same  ADD: she lost her job May 2019,but is working again,  The current , company is in [place] , she has been working for them since Nov 2019 . She is now on Vyvanse 40 mg and she states it is working longer than 30 mg dose  COPD Mild:Last flare was Dec 2019 took Zpack and [employer].. No longer smoking - quit about 12years ago. She states she has noticed that she has been using Combivent more often and is willing to try another medication. We will try Anoro   B12 deficiency: found in June 2016,currently only using SL B12 daily  Major Depression in partial Remission: on Pristiq andoff AlprazolamSymptoms started when husband was diagnosed with lung cancer and died in 2008-09-24.Currentlyduloxetine, no side effects, Phq 9 is  still negative  but down from 11 to 7, she is stressed because daughter still not accepting her boyfriend of 9 years   Hypothyroidism: daughterhasHypothyroidism, last TSH was back to normal, we need to recheck next visit   Atherosclerosis of aorta: she prefers not taking rx medication, she will try red rice yeast at home.  Unchanged   Dysphagia: seen by Dr. Marius Ditch and had dilation done 08/2018 and is doing well since No dysphagia . She is also taking  PPI   Patient Active Problem List   Diagnosis Date Noted  . Esophageal  dysphagia   . Adult hypothyroidism 08/15/2017  . Atherosclerosis of aorta ([place]) 09/22/2016  . Hiatal hernia 09/22/2016  . Abnormal liver CT 09/11/2016  . H/O herpes simplex type 2 infection 08/20/2015  . Hematuria 02/19/2015  . B12 deficiency 02/19/2015  . Attention deficit hyperactivity disorder (ADHD), predominantly inattentive type 11/13/2014  . COPD, mild ([place]) 11/13/2014  . Major depression in partial remission ([place]) 11/13/2014  . History of basal cell carcinoma 11/13/2014  . Gastroesophageal reflux disease without esophagitis 11/13/2014  . History of abnormal cervical Pap smear 11/13/2014  . Migraine without aura and without status migrainosus, not intractable 11/13/2014  . Vitamin D deficiency 11/13/2014  . Liver lesion 10/04/2010    Past Surgical History:  Procedure Laterality Date  . ABDOMINAL HYSTERECTOMY  2004   partial  . BREAST SURGERY     biopsies  . ESOPHAGOGASTRODUODENOSCOPY (EGD) WITH PROPOFOL N/A 08/02/2018   Procedure: ESOPHAGOGASTRODUODENOSCOPY (EGD) WITH PROPOFOL;  Surgeon: Lin Landsman, MD;  Location: [facility];  Service: Gastroenterology;  Laterality: N/A;  . FRACTURE SURGERY     rode in femur  . POLYPECTOMY     vocal chord polyps    Family History  Problem Relation Age of Onset  . Colon cancer Father   . Breast cancer Sister   . Diverticulitis Brother   . Diverticulitis Brother     Social History   Socioeconomic History  . Marital status: Widowed    Spouse name: Not on file  . Number of children: 2  . Years of education: Not on file  . Highest education level: Associate degree: occupational, Hotel manager, or vocational program  Occupational History  . Occupation: Architectural technologist   Social Needs  . Financial resource strain: Not hard at all  . Food insecurity    Worry: Never true    Inability: Never true  . Transportation needs    Medical: No    Non-medical: No  Tobacco Use  . Smoking status: Former Smoker     Packs/day: 1.00    Years: 25.00    Pack years: 25.00    Types: Cigarettes    Quit date: 05/25/2004    Years since quitting: 14.8  . Smokeless tobacco: Never Used  Substance and Sexual Activity  . Alcohol use: No    Alcohol/week: 0.0 standard drinks  . Drug use: No  . Sexual activity: Yes    Partners: Male  Lifestyle  . Physical activity    Days per week: 7 days    Minutes per session: 30 min  . Stress: Not at all  Relationships  . Social connections    Talks on phone: More than three times a week    Gets together: More than three times a week    Attends religious service: Never    Active member of club or organization: Yes    Attends meetings of clubs or organizations: More than 4 times per  year    Relationship status: Widowed  . Intimate partner violence    Fear of current or ex partner: No    Emotionally abused: No    Physically abused: No    Forced sexual activity: No  Other Topics Concern  . Not on file  Social History Narrative   Patient was laid off from May till Nov 2019, but is working again . Works from home for a [employer] company      Current Outpatient Medications:  .  cholecalciferol (VITAMIN D) 1000 UNITS tablet, Take 2,000 Units by mouth daily., Disp: , Rfl:  .  DULoxetine (CYMBALTA) 60 MG capsule, Take 1 capsule (60 mg total) by mouth daily., Disp: 90 capsule, Rfl: 0 .  ibuprofen (ADVIL,MOTRIN) 200 MG tablet, Take by mouth., Disp: , Rfl:  .  Ipratropium-Albuterol (COMBIVENT RESPIMAT) 20-100 MCG/ACT AERS respimat, Inhale 1 puff into the lungs every 6 (six) hours as needed for wheezing or shortness of breath., Disp: 12 g, Rfl: 1 .  levothyroxine (SYNTHROID) 25 MCG tablet, TAKE 1 TABLET BY MOUTH ON TUESDAY, THURSDAY,SATURDAY,SUNDAY AND 2 TABLETS ON MONDAY, WEDNESDAY AND FRIDAY, Disp: 135 tablet, Rfl: 1 .  lisdexamfetamine (VYVANSE) 40 MG capsule, Take 1 capsule (40 mg total) by mouth every morning., Disp: 30 capsule, Rfl: 0 .  omeprazole (PRILOSEC) 40 MG capsule, TAKE  ONE CAPSULE BY MOUTH DAILY BEFORE BREAKFAST FOR 30 DAYS., Disp: 30 capsule, Rfl: 0 .  SUMAtriptan (IMITREX) 100 MG tablet, Take 1 tablet (100 mg total) by mouth as needed for migraine. May repeat in 2 hours if headache persists or recurs., Disp: 18 tablet, Rfl: 1 .  topiramate (TOPAMAX) 100 MG tablet, Take 1 tablet (100 mg total) by mouth 2 (two) times daily., Disp: 180 tablet, Rfl: 1 .  traMADol (ULTRAM) 50 MG tablet, Take 1 tablet by mouth 2 (two) times daily as needed., Disp: , Rfl:  .  valACYclovir (VALTREX) 500 MG tablet, TAKE 2 TABLETS BY MOUTH TWICE DAILY FOR 10 DAYS, Disp: 20 tablet, Rfl: 0  Allergies  Allergen Reactions  . Codeine   . Pantoprazole     Flu like syptoms    I personally reviewed active problem list, medication list, allergies, family history, social history with the patient/caregiver today.   ROS  Ten systems reviewed and is negative except as mentioned in HPI   Objective  Virtual encounter, vitals not obtained.  There is no height or weight on file to calculate BMI.  Physical Exam  Awake, alert and oriented   PHQ2/9: Depression screen [facility] 04/11/2019 01/04/2019 10/04/2018 10/03/2018 07/01/2018  Decreased Interest 0 0 0 0 -  Down, Depressed, Hopeless 0 0 0 0 -  PHQ - 2 Score 0 0 0 0 -  Altered sleeping 1 3 3 3  0  Tired, decreased energy 3 3 3 3 1   Change in appetite 3 0 3 3 1   Feeling bad or failure about yourself  0 0 1 1 0  Trouble concentrating 0 0 1 1 0  Moving slowly or fidgety/restless 0 0 0 0 0  Suicidal thoughts 0 0 0 0 0  PHQ-9 Score 7 6 11 11  -  Difficult doing work/chores Not difficult at all Not difficult at all Not difficult at all Not difficult at all -  Some recent data might be hidden   PHQ-2/9 Result is negative     Fall Risk: Fall Risk  04/11/2019 01/04/2019 10/04/2018 10/03/2018 07/01/2018  Falls in the past year? 1 1 0 0  0  Number falls in past yr: 0 0 0 0 0  Comment - tripped over dog and hurt left hand (per pt not major) - - -   Injury with Fall? 0 1 0 0 0  Follow up - Falls evaluation completed - - Falls evaluation completed     Assessment & Plan   1. Moderate major depression (HCC)  - DULoxetine (CYMBALTA) 60 MG capsule; Take 1 capsule (60 mg total) by mouth daily.  Dispense: 90 capsule; Refill: 0  2. Hypothyroidism in adult  - levothyroxine (SYNTHROID) 25 MCG tablet; TAKE 1 TABLET BY MOUTH ON TUESDAY, THURSDAY,SATURDAY,SUNDAY AND 2 TABLETS ON MONDAY, WEDNESDAY AND FRIDAY  Dispense: 135 tablet; Refill: 0  3. Attention deficit hyperactivity disorder (ADHD), predominantly inattentive type  - lisdexamfetamine (VYVANSE) 40 MG capsule; Take 1 capsule (40 mg total) by mouth every morning.  Dispense: 30 capsule; Refill: 0 - lisdexamfetamine (VYVANSE) 40 MG capsule; Take 1 capsule (40 mg total) by mouth every morning.  Dispense: 30 capsule; Refill: 0 - lisdexamfetamine (VYVANSE) 40 MG capsule; Take 1 capsule (40 mg total) by mouth every morning.  Dispense: 30 capsule; Refill: 0  4. S/P balloon dilatation of esophageal stricture  - omeprazole (PRILOSEC) 40 MG capsule; TAKE ONE CAPSULE BY MOUTH DAILY BEFORE BREAKFAST FOR 30 DAYS.  Dispense: 30 capsule; Refill: 0  5. COPD, mild (HCC)  - umeclidinium-vilanterol (ANORO ELLIPTA) 62.5-25 MCG/INH AEPB; Inhale 1 puff into the lungs daily.  Dispense: 60 each; Refill: 2  6. Migraine without aura and without status migrainosus, not intractable  - Ubrogepant (UBRELVY) 100 MG TABS; Take 1 tablet by mouth daily as needed.  Dispense: 10 tablet; Refill: 2  7. GERD without esophagitis   8. Atherosclerosis of aorta (HCC)  Discussed medication   9. Dyslipidemia  She needs to have labs done  10. H/O herpes simplex type 2 infection  - valACYclovir (VALTREX) 500 MG tablet; TAKE 2 TABLETS BY MOUTH TWICE DAILY FOR 10 DAYS  Dispense: 20 tablet; Refill: 0  11. Insomnia, unspecified type  - hydrOXYzine (ATARAX/VISTARIL) 10 MG tablet; Take 1 tablet (10 mg total) by mouth at  bedtime.  Dispense: 90 tablet; Refill: 0  I discussed the assessment and treatment plan with the patient. The patient was provided an opportunity to ask questions and all were answered. The patient agreed with the plan and demonstrated an understanding of the instructions.  The patient was advised to call back or seek an in-person evaluation if the symptoms worsen or if the condition fails to improve as anticipated.  I provided 25 minutes of non-face-to-face time during this encounter.

## 2019-04-14 ENCOUNTER — Other Ambulatory Visit: Payer: Self-pay | Admitting: Gastroenterology

## 2019-04-14 DIAGNOSIS — Z9889 Other specified postprocedural states: Secondary | ICD-10-CM

## 2019-05-10 ENCOUNTER — Other Ambulatory Visit: Payer: Self-pay | Admitting: Family Medicine

## 2019-05-10 DIAGNOSIS — Z9889 Other specified postprocedural states: Secondary | ICD-10-CM

## 2019-05-16 ENCOUNTER — Encounter: Payer: Self-pay | Admitting: Family Medicine

## 2019-05-17 ENCOUNTER — Other Ambulatory Visit: Payer: Self-pay

## 2019-05-17 ENCOUNTER — Encounter: Payer: Self-pay | Admitting: Family Medicine

## 2019-05-17 ENCOUNTER — Ambulatory Visit (INDEPENDENT_AMBULATORY_CARE_PROVIDER_SITE_OTHER): Payer: 59 | Admitting: Family Medicine

## 2019-05-17 DIAGNOSIS — J449 Chronic obstructive pulmonary disease, unspecified: Secondary | ICD-10-CM | POA: Diagnosis not present

## 2019-05-17 DIAGNOSIS — R300 Dysuria: Secondary | ICD-10-CM | POA: Diagnosis not present

## 2019-05-17 MED ORDER — INCRUSE ELLIPTA 62.5 MCG/INH IN AEPB: 1.0000 | INHALATION_SPRAY | Freq: Every day | RESPIRATORY_TRACT | 2 refills | Status: DC

## 2019-05-17 MED ORDER — CIPROFLOXACIN HCL 250 MG PO TABS: 250.0000 mg | ORAL_TABLET | Freq: Two times a day (BID) | ORAL | 0 refills | Status: DC

## 2019-05-17 NOTE — Progress Notes (Signed)
Name: Jane Patrick   MRN: [id number]    DOB: 1960-09-15   Date:05/17/2019       Progress Note  Subjective  Chief Complaint  Chief Complaint  Patient presents with  . Headache    from inhaler for 2 weeks, has recently stopped  . Urinary Tract Infection    pressure    I connected with  [name] on 05/17/19 at  1:00 PM EST by telephone and verified that I am speaking with the correct person using two identifiers.   I discussed the limitations, risks, security and privacy concerns of performing an evaluation and management service by telephone and the availability of in person appointments. Staff also discussed with the patient that there may be a patient responsible charge related to this service. Patient Location: at home  Provider Location: [facility]   HPI  Dysuria: she noticed incomplete void, supra pubic pressure and urinary frequency since yesterday. No fever or chills. No nausea, vomiting or back pain. She states AZO helped symptoms a little   COPD: we gave her Anoro on her last visit and she states it work well initially, however after two weeks she noticed a daily headache. Symptoms resolved once she stopped inhaler. She states while on Anoro she could breath better and did not have to use rescue inhaler as often, we will try changing to Incruse   Patient Active Problem List   Diagnosis Date Noted  . Esophageal dysphagia   . Adult hypothyroidism 08/15/2017  . Atherosclerosis of aorta ([place]) 09/22/2016  . Hiatal hernia 09/22/2016  . Abnormal liver CT 09/11/2016  . H/O herpes simplex type 2 infection 08/20/2015  . Hematuria 02/19/2015  . B12 deficiency 02/19/2015  . Attention deficit hyperactivity disorder (ADHD), predominantly inattentive type 11/13/2014  . COPD, mild ([place]) 11/13/2014  . Major depression in partial remission ([place]) 11/13/2014  . History of basal cell carcinoma 11/13/2014  . Gastroesophageal reflux disease without esophagitis  11/13/2014  . History of abnormal cervical Pap smear 11/13/2014  . Migraine without aura and without status migrainosus, not intractable 11/13/2014  . Vitamin D deficiency 11/13/2014  . Liver lesion 10/04/2010    Social History   Tobacco Use  . Smoking status: Former Smoker    Packs/day: 1.00    Years: 25.00    Pack years: 25.00    Types: Cigarettes    Quit date: 05/25/2004    Years since quitting: 14.9  . Smokeless tobacco: Never Used  Substance Use Topics  . Alcohol use: No    Alcohol/week: 0.0 standard drinks     Current Outpatient Medications:  .  cholecalciferol (VITAMIN D) 1000 UNITS tablet, Take 2,000 Units by mouth daily., Disp: , Rfl:  .  DULoxetine (CYMBALTA) 60 MG capsule, Take 1 capsule (60 mg total) by mouth daily., Disp: 90 capsule, Rfl: 0 .  hydrOXYzine (ATARAX/VISTARIL) 10 MG tablet, Take 1 tablet (10 mg total) by mouth at bedtime., Disp: 90 tablet, Rfl: 0 .  ibuprofen (ADVIL,MOTRIN) 200 MG tablet, Take by mouth., Disp: , Rfl:  .  Ipratropium-Albuterol (COMBIVENT RESPIMAT) 20-100 MCG/ACT AERS respimat, Inhale 1 puff into the lungs every 6 (six) hours as needed for wheezing or shortness of breath., Disp: 12 g, Rfl: 1 .  levothyroxine (SYNTHROID) 25 MCG tablet, TAKE 1 TABLET BY MOUTH ON TUESDAY, THURSDAY,SATURDAY,SUNDAY AND 2 TABLETS ON MONDAY, WEDNESDAY AND FRIDAY, Disp: 135 tablet, Rfl: 0 .  lisdexamfetamine (VYVANSE) 40 MG capsule, Take 1 capsule (40 mg total) by mouth  every morning., Disp: 30 capsule, Rfl: 0 .  lisdexamfetamine (VYVANSE) 40 MG capsule, Take 1 capsule (40 mg total) by mouth every morning., Disp: 30 capsule, Rfl: 0 .  lisdexamfetamine (VYVANSE) 40 MG capsule, Take 1 capsule (40 mg total) by mouth every morning., Disp: 30 capsule, Rfl: 0 .  omeprazole (PRILOSEC) 40 MG capsule, TAKE 1 CAPSULE BY MOUTH DAILY BEFORE BREAKFAST, Disp: 30 capsule, Rfl: 0 .  SUMAtriptan (IMITREX) 100 MG tablet, Take 1 tablet (100 mg total) by mouth as needed for migraine. May  repeat in 2 hours if headache persists or recurs., Disp: 18 tablet, Rfl: 1 .  topiramate (TOPAMAX) 100 MG tablet, Take 1 tablet (100 mg total) by mouth 2 (two) times daily., Disp: 180 tablet, Rfl: 1 .  traMADol (ULTRAM) 50 MG tablet, Take 1 tablet by mouth 2 (two) times daily as needed., Disp: , Rfl:  .  Ubrogepant (UBRELVY) 100 MG TABS, Take 1 tablet by mouth daily as needed., Disp: 10 tablet, Rfl: 2 .  umeclidinium-vilanterol (ANORO ELLIPTA) 62.5-25 MCG/INH AEPB, Inhale 1 puff into the lungs daily. (Patient not taking: Reported on 05/17/2019), Disp: 60 each, Rfl: 2 .  valACYclovir (VALTREX) 500 MG tablet, TAKE 2 TABLETS BY MOUTH TWICE DAILY FOR 10 DAYS, Disp: 20 tablet, Rfl: 0  Allergies  Allergen Reactions  . Codeine   . Pantoprazole     Flu like syptoms    I personally reviewed active problem list, medication list, allergies, family history, social history with the patient/caregiver today.  ROS  Ten systems reviewed and is negative except as mentioned in HPI   Objective  Virtual encounter, vitals not obtained.  There is no height or weight on file to calculate BMI.  Nursing Note and Vital Signs reviewed.  Physical Exam  Awake, alert and oriented  Assessment & Plan  1. Dysuria  - CULTURE, URINE COMPREHENSIVE - ciprofloxacin (CIPRO) 250 MG tablet; Take 1 tablet (250 mg total) by mouth 2 (two) times daily.  Dispense: 6 tablet; Refill: 0  2. COPD, mild (HCC)  - umeclidinium bromide (INCRUSE ELLIPTA) 62.5 MCG/INH AEPB; Inhale 1 puff into the lungs daily.  Dispense: 60 each; Refill: 2 Discussed pneumonia and flu vaccines, she states she will return tomorrow for the flu, wait for PCV on her next visit  -Red flags and when to present for emergency care or RTC including fever >101.16F, chest pain, shortness of breath, new/worsening/un-resolving symptoms, reviewed with patient at time of visit. Follow up and care instructions discussed and provided in AVS. - I discussed the  assessment and treatment plan with the patient. The patient was provided an opportunity to ask questions and all were answered. The patient agreed with the plan and demonstrated an understanding of the instructions.  - The patient was advised to call back or seek an in-person evaluation if the symptoms worsen or if the condition fails to improve as anticipated.  I provided 15  minutes of non-face-to-face time during this encounter.  Ruel Favors, MD

## 2019-05-18 ENCOUNTER — Ambulatory Visit (INDEPENDENT_AMBULATORY_CARE_PROVIDER_SITE_OTHER): Payer: 59

## 2019-05-18 ENCOUNTER — Other Ambulatory Visit: Payer: Self-pay

## 2019-05-18 DIAGNOSIS — Z23 Encounter for immunization: Secondary | ICD-10-CM | POA: Diagnosis not present

## 2019-05-20 ENCOUNTER — Other Ambulatory Visit: Payer: Self-pay | Admitting: Family Medicine

## 2019-05-20 DIAGNOSIS — J449 Chronic obstructive pulmonary disease, unspecified: Secondary | ICD-10-CM

## 2019-06-07 ENCOUNTER — Other Ambulatory Visit: Payer: Self-pay | Admitting: Family Medicine

## 2019-06-07 DIAGNOSIS — Z9889 Other specified postprocedural states: Secondary | ICD-10-CM

## 2019-06-14 LAB — HM MAMMOGRAPHY: HM Mammogram: NORMAL (ref 0–4)

## 2019-06-28 ENCOUNTER — Other Ambulatory Visit: Payer: Self-pay | Admitting: Physical Medicine and Rehabilitation

## 2019-06-28 ENCOUNTER — Encounter: Payer: Self-pay | Admitting: Family Medicine

## 2019-06-28 DIAGNOSIS — M899 Disorder of bone, unspecified: Secondary | ICD-10-CM

## 2019-07-01 ENCOUNTER — Other Ambulatory Visit: Payer: Self-pay | Admitting: Family Medicine

## 2019-07-01 DIAGNOSIS — E039 Hypothyroidism, unspecified: Secondary | ICD-10-CM

## 2019-07-05 ENCOUNTER — Other Ambulatory Visit: Payer: Self-pay

## 2019-07-05 ENCOUNTER — Ambulatory Visit
Admission: RE | Admit: 2019-07-05 | Discharge: 2019-07-05 | Disposition: A | Discharge status: Home / Self Care | Payer: Managed Care, Other (non HMO) | Source: Ambulatory Visit | Attending: Physical Medicine and Rehabilitation | Admitting: Physical Medicine and Rehabilitation

## 2019-07-05 DIAGNOSIS — M899 Disorder of bone, unspecified: Secondary | ICD-10-CM | POA: Insufficient documentation

## 2019-07-05 MED ORDER — GADOBUTROL 1 MMOL/ML IV SOLN
7.0000 mL | Freq: Once | INTRAVENOUS | Status: AC | PRN
  Administered 2019-07-05: 7 mL via INTRAVENOUS

## 2019-07-07 ENCOUNTER — Other Ambulatory Visit: Payer: Self-pay | Admitting: Family Medicine

## 2019-07-07 DIAGNOSIS — Z9889 Other specified postprocedural states: Secondary | ICD-10-CM

## 2019-07-12 ENCOUNTER — Ambulatory Visit (INDEPENDENT_AMBULATORY_CARE_PROVIDER_SITE_OTHER): Payer: Managed Care, Other (non HMO) | Admitting: Family Medicine

## 2019-07-12 ENCOUNTER — Encounter: Payer: Self-pay | Admitting: Family Medicine

## 2019-07-12 VITALS — BP 137/77 | Temp 97.5°F | Ht 63.0 in | Wt 169.0 lb

## 2019-07-12 DIAGNOSIS — I700 Atherosclerosis of aorta: Secondary | ICD-10-CM

## 2019-07-12 DIAGNOSIS — E559 Vitamin D deficiency, unspecified: Secondary | ICD-10-CM

## 2019-07-12 DIAGNOSIS — F900 Attention-deficit hyperactivity disorder, predominantly inattentive type: Secondary | ICD-10-CM | POA: Diagnosis not present

## 2019-07-12 DIAGNOSIS — G43009 Migraine without aura, not intractable, without status migrainosus: Secondary | ICD-10-CM

## 2019-07-12 DIAGNOSIS — Z79899 Other long term (current) drug therapy: Secondary | ICD-10-CM

## 2019-07-12 DIAGNOSIS — F321 Major depressive disorder, single episode, moderate: Secondary | ICD-10-CM

## 2019-07-12 DIAGNOSIS — G4700 Insomnia, unspecified: Secondary | ICD-10-CM

## 2019-07-12 DIAGNOSIS — E039 Hypothyroidism, unspecified: Secondary | ICD-10-CM | POA: Diagnosis not present

## 2019-07-12 DIAGNOSIS — J449 Chronic obstructive pulmonary disease, unspecified: Secondary | ICD-10-CM | POA: Diagnosis not present

## 2019-07-12 DIAGNOSIS — Z9889 Other specified postprocedural states: Secondary | ICD-10-CM

## 2019-07-12 DIAGNOSIS — Z8619 Personal history of other infectious and parasitic diseases: Secondary | ICD-10-CM

## 2019-07-12 DIAGNOSIS — E538 Deficiency of other specified B group vitamins: Secondary | ICD-10-CM

## 2019-07-12 DIAGNOSIS — K219 Gastro-esophageal reflux disease without esophagitis: Secondary | ICD-10-CM

## 2019-07-12 DIAGNOSIS — R739 Hyperglycemia, unspecified: Secondary | ICD-10-CM

## 2019-07-12 DIAGNOSIS — E785 Hyperlipidemia, unspecified: Secondary | ICD-10-CM

## 2019-07-12 MED ORDER — VALACYCLOVIR HCL 500 MG PO TABS: ORAL_TABLET | ORAL | 0 refills | Status: DC

## 2019-07-12 MED ORDER — TOPIRAMATE 100 MG PO TABS: 100.0000 mg | ORAL_TABLET | Freq: Two times a day (BID) | ORAL | 1 refills | Status: DC

## 2019-07-12 MED ORDER — OMEPRAZOLE 40 MG PO CPDR: 40.0000 mg | DELAYED_RELEASE_CAPSULE | Freq: Every day | ORAL | 1 refills | Status: DC

## 2019-07-12 MED ORDER — AMPHETAMINE-DEXTROAMPHET ER 15 MG PO CP24: 15.0000 mg | ORAL_CAPSULE | ORAL | 0 refills | Status: DC

## 2019-07-12 MED ORDER — DULOXETINE HCL 60 MG PO CPEP: 60.0000 mg | ORAL_CAPSULE | Freq: Every day | ORAL | 1 refills | Status: DC

## 2019-07-12 NOTE — Progress Notes (Signed)
Name: Jane Patrick   MRN: [id number]    DOB: 02/12/1961   Date:07/12/2019       Progress Note  Subjective  Chief Complaint  Chief Complaint  Patient presents with  . Medication Refill    3 month F/U  . COPD  . Migraine    Has been bad  . Herpes Type II  . ADD  . Depression  . Hypothyroidism  . Atherosclerosis of aorta    I connected with  [name] on 07/12/19 at 10:20 AM EST by telephone and verified that I am speaking with the correct person using two identifiers.  I discussed the limitations, risks, security and privacy concerns of performing an evaluation and management service by telephone and the availability of in person appointments. Staff also discussed with the patient that there may be a patient responsible charge related to this service. Patient Location: at home  Provider Location: [facility]    HPI  Chronic back pain: : she is seeing Dr. Phyllis Ginger and had MRI done 03/30/2019, and showed L2- 3  Disc bulge , and also a hypointense bone lesion, repeat Feb 2021 showed stability . She continues to have daily pain, she is still taking Tramadol Pain level at this time is 4/10. She states when standing to cook, or walking when she goes groceries shopping   Migraine headache: she is on Topamax for prevention, usually triggered by barometric pressure changes.Imitrex helps with symptoms, takes about 45 minutes to resolve the symptoms and she feels groggy the following day. Symptoms are described as throbbing, usually temporal but can radiate to the entire head. It is associates with nausea and vomiting, at times associated with phonophobia and photophobia. She had a few episodes since last visit. Discussed Ubrelvy.   Abnormal pap smear in 2012: she was referred to [facility], and advised to see Dr. Merilyn Baba.  She had a TAH in 2004 for treatment of abnormal cervical cells. She was seen by Dr. Leafy Ro at [facility] and last pap done in Feb 2017 was normal  with positive HPV , October 2017, she missed appointment 02/2017 , she lost her job and has to re-schedule that she has insurance.No vaginal bleeding or cramping. She had repeat pap smear 05/2019 by gyn and was normal   Herpes Type II: she had a small lesion when she was seen by Dr. Leafy Ro and culture was positive for herpes, she was given Valtrex, no other outbreaks since. Unchanged , no recent episodes   Hematuria: seen at [facility] in 2012, had multiple CT, last one in 2012 and stable liver lesions no further evaluation, patient states also told the same by Urologist for negative hematuria evaluation. Unchanged   ADD: she lost her job May 2019,but is working again, The current , company is in [place], she has been working for them since Nov 2019. Sheis now on Vyvanse 40 mg and she states it is working longer than 30 mg dose , however keeps her up at night when she does not take it right away, she would like to go back to Adderal, we will try 15 mg XR and she will le me know in about 3 weeks if dose is working for her  COPD Mild:Last flare was Dec 2019 took Zpack and tessalon perles.. No longer smoking - quit about 12years ago. She was using Combivent more often end of 2020 we added Anoro but it caused palpitation. We switched to Incruse but she is not using daily  B12 deficiency: found in June 2016,currently only using SL B12 daily, we will recheck   Major Depression in partial Remission: on Pristiq andoff AlprazolamSymptoms started when husband was diagnosed with lung cancer and died in 09/23/2008.Currentlyduloxetine, no side effects,Phq 9 is high again, she got engaged on Valentine's day, still not talking to oldest daughter, but sees her grandchildren. She feels like it could be because she has been staying more at home because of COVID-19   Hypothyroidism: daughterhasHypothyroidism,last TSH was back to normal, she will stop by for labs recently  Atherosclerosis of aorta:  she prefers not taking rx medication, she will try red rice yeast at home.    Dysphagia:seen by Dr. Allegra Lai and had dilation done 09/24/18 and is doing well since No dysphagia . She is taking PPI and symptoms are controlled   Patient Active Problem List   Diagnosis Date Noted  . Esophageal dysphagia   . Adult hypothyroidism 08/15/2017  . Atherosclerosis of aorta (HCC) 09/22/2016  . Hiatal hernia 09/22/2016  . Abnormal liver CT 09/11/2016  . H/O herpes simplex type 2 infection 08/20/2015  . Hematuria 02/19/2015  . B12 deficiency 02/19/2015  . Attention deficit hyperactivity disorder (ADHD), predominantly inattentive type 11/13/2014  . COPD, mild (HCC) 11/13/2014  . Major depression in partial remission (HCC) 11/13/2014  . History of basal cell carcinoma 11/13/2014  . Gastroesophageal reflux disease without esophagitis 11/13/2014  . History of abnormal cervical Pap smear 11/13/2014  . Migraine without aura and without status migrainosus, not intractable 11/13/2014  . Vitamin D deficiency 11/13/2014  . Liver lesion 10/04/2010    Past Surgical History:  Procedure Laterality Date  . ABDOMINAL HYSTERECTOMY  Sep 24, 2002   partial  . BREAST SURGERY     biopsies  . ESOPHAGOGASTRODUODENOSCOPY (EGD) WITH PROPOFOL N/A 08/02/2018   Procedure: ESOPHAGOGASTRODUODENOSCOPY (EGD) WITH PROPOFOL;  Surgeon: [name], MD;  Location: [facility] ENDOSCOPY;  Service: Gastroenterology;  Laterality: N/A;  . FRACTURE SURGERY     rode in femur  . POLYPECTOMY     vocal chord polyps    Family History  Problem Relation Age of Onset  . Colon cancer Father   . Breast cancer Sister   . Diverticulitis Brother   . Diverticulitis Brother     Social History   Socioeconomic History  . Marital status: Widowed    Spouse name: Not on file  . Number of children: 2  . Years of education: Not on file  . Highest education level: Associate degree: occupational, Scientist, product/process development, or vocational program  Occupational  History  . Occupation: Doctor, general practice   Tobacco Use  . Smoking status: Former Smoker    Packs/day: 1.00    Years: 25.00    Pack years: 25.00    Types: Cigarettes    Quit date: 05/25/2004    Years since quitting: 15.1  . Smokeless tobacco: Never Used  Substance and Sexual Activity  . Alcohol use: No    Alcohol/week: 0.0 standard drinks  . Drug use: No  . Sexual activity: Yes    Partners: Male  Other Topics Concern  . Not on file  Social History Narrative   Patient was laid off from May till Nov 2019, but is working again . Works from home for a [employer]    Social Determinants of Corporate investment banker Strain:   . Difficulty of Paying Living Expenses: Not on file  Food Insecurity:   . Worried About Programme researcher, broadcasting/film/video in the Last Year: Not  on file  . Ran Out of Food in the Last Year: Not on file  Transportation Needs:   . Lack of Transportation (Medical): Not on file  . Lack of Transportation (Non-Medical): Not on file  Physical Activity:   . Days of Exercise per Week: Not on file  . Minutes of Exercise per Session: Not on file  Stress:   . Feeling of Stress : Not on file  Social Connections:   . Frequency of Communication with Friends and Family: Not on file  . Frequency of Social Gatherings with Friends and Family: Not on file  . Attends Religious Services: Not on file  . Active Member of Clubs or Organizations: Not on file  . Attends Banker Meetings: Not on file  . Marital Status: Not on file  Intimate Partner Violence:   . Fear of Current or Ex-Partner: Not on file  . Emotionally Abused: Not on file  . Physically Abused: Not on file  . Sexually Abused: Not on file     Current Outpatient Medications:  .  cholecalciferol (VITAMIN D) 1000 UNITS tablet, Take 2,000 Units by mouth daily., Disp: , Rfl:  .  ciprofloxacin (CIPRO) 250 MG tablet, Take 1 tablet (250 mg total) by mouth 2 (two) times daily., Disp: 6 tablet, Rfl: 0 .  COMBIVENT  RESPIMAT 20-100 MCG/ACT AERS respimat, INHALE 1 PUFF INTO THE LUNGS EVERY 6 HOURS AS NEEDED FOR WHEEZING OR SHORTNESS OF BREATH, Disp: 12 g, Rfl: 1 .  DULoxetine (CYMBALTA) 60 MG capsule, Take 1 capsule (60 mg total) by mouth daily., Disp: 90 capsule, Rfl: 0 .  hydrOXYzine (ATARAX/VISTARIL) 10 MG tablet, Take 1 tablet (10 mg total) by mouth at bedtime., Disp: 90 tablet, Rfl: 0 .  ibuprofen (ADVIL,MOTRIN) 200 MG tablet, Take by mouth., Disp: , Rfl:  .  levothyroxine (SYNTHROID) 25 MCG tablet, TAKE 1 TABLET BY MOUTH ON TUESDAY, THURSDAY,SATURDAY,SUNDAY AND 2 TABLETS ON MONDAY, WEDNESDAY AND FRIDAY, Disp: 45 tablet, Rfl: 0 .  lisdexamfetamine (VYVANSE) 40 MG capsule, Take 1 capsule (40 mg total) by mouth every morning., Disp: 30 capsule, Rfl: 0 .  lisdexamfetamine (VYVANSE) 40 MG capsule, Take 1 capsule (40 mg total) by mouth every morning., Disp: 30 capsule, Rfl: 0 .  lisdexamfetamine (VYVANSE) 40 MG capsule, Take 1 capsule (40 mg total) by mouth every morning., Disp: 30 capsule, Rfl: 0 .  omeprazole (PRILOSEC) 40 MG capsule, TAKE 1 CAPSULE BY MOUTH DAILY BEFORE BREAKFAST, Disp: 30 capsule, Rfl: 0 .  SUMAtriptan (IMITREX) 100 MG tablet, Take 1 tablet (100 mg total) by mouth as needed for migraine. May repeat in 2 hours if headache persists or recurs., Disp: 18 tablet, Rfl: 1 .  topiramate (TOPAMAX) 100 MG tablet, Take 1 tablet (100 mg total) by mouth 2 (two) times daily., Disp: 180 tablet, Rfl: 1 .  traMADol (ULTRAM) 50 MG tablet, Take 1 tablet by mouth 2 (two) times daily as needed., Disp: , Rfl:  .  Ubrogepant (UBRELVY) 100 MG TABS, Take 1 tablet by mouth daily as needed., Disp: 10 tablet, Rfl: 2 .  umeclidinium bromide (INCRUSE ELLIPTA) 62.5 MCG/INH AEPB, Inhale 1 puff into the lungs daily., Disp: 60 each, Rfl: 2 .  umeclidinium-vilanterol (ANORO ELLIPTA) 62.5-25 MCG/INH AEPB, Inhale 1 puff into the lungs daily., Disp: 60 each, Rfl: 2 .  valACYclovir (VALTREX) 500 MG tablet, TAKE 2 TABLETS BY MOUTH  TWICE DAILY FOR 10 DAYS, Disp: 20 tablet, Rfl: 0  Allergies  Allergen Reactions  . Codeine   .  Pantoprazole     Flu like syptoms    I personally reviewed active problem list, medication list, allergies, family history, social history, health maintenance with the patient/caregiver today.   ROS  Ten systems reviewed and is negative except as mentioned in HPI   Objective  Virtual encounter, vitals  Obtained at home   Today's Vitals   07/12/19 0933 07/12/19 1120  BP: 140/83 137/77  Temp: (!) 97.5 F (36.4 C)   TempSrc: Temporal   Weight: 169 lb (76.7 kg)   Height: 5\' 3"  (1.6 m)    Body mass index is 29.94 kg/m.  Physical Exam  Awake, alert and oriented   PHQ2/9: Depression screen [facility] 2/9 07/12/2019 05/17/2019 04/11/2019 01/04/2019 10/04/2018  Decreased Interest 1 1 0 0 0  Down, Depressed, Hopeless 2 1 0 0 0  PHQ - 2 Score 3 2 0 0 0  Altered sleeping 3 0 1 3 3   Tired, decreased energy 3 0 3 3 3   Change in appetite 1 0 3 0 3  Feeling bad or failure about yourself  1 0 0 0 1  Trouble concentrating 2 0 0 0 1  Moving slowly or fidgety/restless 0 0 0 0 0  Suicidal thoughts 0 0 0 0 0  PHQ-9 Score 13 2 7 6 11   Difficult doing work/chores Somewhat difficult Not difficult at all Not difficult at all Not difficult at all Not difficult at all  Some recent data might be hidden   PHQ-2/9 Result is positive.    Fall Risk: Fall Risk  07/12/2019 05/17/2019 04/11/2019 01/04/2019 10/04/2018  Falls in the past year? 0 0 1 1 0  Number falls in past yr: 0 0 0 0 0  Comment - - - tripped over dog and hurt left hand (per pt not major) -  Injury with Fall? 0 0 0 1 0  Follow up - Falls evaluation completed - Falls evaluation completed -     Assessment & Plan  1. Moderate major depression (HCC)  - DULoxetine (CYMBALTA) 60 MG capsule; Take 1 capsule (60 mg total) by mouth daily.  Dispense: 90 capsule; Refill: 1 - amphetamine-dextroamphetamine (ADDERALL XR) 15 MG 24 hr capsule; Take 1  capsule by mouth every morning.  Dispense: 30 capsule; Refill: 0  2. Hypothyroidism in adult  - TSH  3. COPD, mild (HCC)   4. Attention deficit hyperactivity disorder (ADHD), predominantly inattentive type  We will try Adderal   5. GERD without esophagitis   6. Vitamin D deficiency  - VITAMIN D 25 Hydroxy (Vit-D Deficiency, Fractures)  7. Long-term use of high-risk medication  - COMPLETE METABOLIC PANEL WITH GFR  8. Hyperglycemia  - Hemoglobin A1c  9. B12 deficiency  - CBC with Differential/Platelet - Vitamin B12  10. Dyslipidemia  - Lipid panel  11. Migraine without aura and without status migrainosus, not intractable  - topiramate (TOPAMAX) 100 MG tablet; Take 1 tablet (100 mg total) by mouth 2 (two) times daily.  Dispense: 180 tablet; Refill: 1  12. Atherosclerosis of aorta (HCC)  Continue statin therapy   13. Insomnia, unspecified type   14. S/P balloon dilatation of esophageal stricture  - omeprazole (PRILOSEC) 40 MG capsule; Take 1 capsule (40 mg total) by mouth daily.  Dispense: 90 capsule; Refill: 1  15. H/O herpes simplex type 2 infection  - valACYclovir (VALTREX) 500 MG tablet; TAKE 2 TABLETS BY MOUTH TWICE DAILY FOR 10 DAYS  Dispense: 20 tablet; Refill: 0  I discussed the assessment and  treatment plan with the patient. The patient was provided an opportunity to ask questions and all were answered. The patient agreed with the plan and demonstrated an understanding of the instructions.   The patient was advised to call back or seek an in-person evaluation if the symptoms worsen or if the condition fails to improve as anticipated.  I provided 25 minutes of non-face-to-face time during this encounter.  Ruel Favors, MD

## 2019-08-07 ENCOUNTER — Other Ambulatory Visit: Payer: Self-pay | Admitting: Family Medicine

## 2019-08-07 DIAGNOSIS — Z9889 Other specified postprocedural states: Secondary | ICD-10-CM

## 2019-08-23 ENCOUNTER — Other Ambulatory Visit: Payer: Self-pay | Admitting: Family Medicine

## 2019-08-23 ENCOUNTER — Encounter: Payer: Self-pay | Admitting: Family Medicine

## 2019-08-23 DIAGNOSIS — F321 Major depressive disorder, single episode, moderate: Secondary | ICD-10-CM

## 2019-08-23 MED ORDER — AMPHETAMINE-DEXTROAMPHET ER 15 MG PO CP24: 15.0000 mg | ORAL_CAPSULE | ORAL | 0 refills | Status: DC

## 2019-10-01 ENCOUNTER — Other Ambulatory Visit: Payer: Self-pay | Admitting: Family Medicine

## 2019-10-01 DIAGNOSIS — G4700 Insomnia, unspecified: Secondary | ICD-10-CM

## 2019-10-01 NOTE — Telephone Encounter (Signed)
Requested Prescriptions  Pending Prescriptions Disp Refills  . hydrOXYzine (ATARAX/VISTARIL) 10 MG tablet [Pharmacy Med Name: HYDROXYZINE HCL 10MG  TABLETS] 90 tablet 0    Sig: TAKE 1 TABLET(10 MG) BY MOUTH AT BEDTIME     Ear, Nose, and Throat:  Antihistamines Passed - 10/01/2019  3:35 AM      Passed - Valid encounter within last 12 months    Recent Outpatient Visits          2 months ago Moderate major depression [facility])   [facility], MD   4 months ago Dysuria   [facility], MD   5 months ago COPD, mild [facility])   [facility], MD   9 months ago Hypothyroidism in adult   [facility], MD   12 months ago COPD, mild [facility])   [facility], MD      Future Appointments            In 1 week Alba Cory, MD [facility], [facility]

## 2019-10-09 ENCOUNTER — Ambulatory Visit (INDEPENDENT_AMBULATORY_CARE_PROVIDER_SITE_OTHER): Payer: 59 | Admitting: Family Medicine

## 2019-10-09 ENCOUNTER — Encounter: Payer: Self-pay | Admitting: Family Medicine

## 2019-10-09 DIAGNOSIS — G43009 Migraine without aura, not intractable, without status migrainosus: Secondary | ICD-10-CM

## 2019-10-09 DIAGNOSIS — F321 Major depressive disorder, single episode, moderate: Secondary | ICD-10-CM

## 2019-10-09 DIAGNOSIS — G4700 Insomnia, unspecified: Secondary | ICD-10-CM

## 2019-10-09 DIAGNOSIS — E039 Hypothyroidism, unspecified: Secondary | ICD-10-CM | POA: Diagnosis not present

## 2019-10-09 DIAGNOSIS — J449 Chronic obstructive pulmonary disease, unspecified: Secondary | ICD-10-CM | POA: Diagnosis not present

## 2019-10-09 DIAGNOSIS — F900 Attention-deficit hyperactivity disorder, predominantly inattentive type: Secondary | ICD-10-CM | POA: Diagnosis not present

## 2019-10-09 MED ORDER — HYDROXYZINE HCL 25 MG PO TABS: 25.0000 mg | ORAL_TABLET | Freq: Every evening | ORAL | 0 refills | Status: DC

## 2019-10-09 MED ORDER — AMPHETAMINE-DEXTROAMPHET ER 15 MG PO CP24: 15.0000 mg | ORAL_CAPSULE | ORAL | 0 refills | Status: DC

## 2019-10-09 NOTE — Progress Notes (Signed)
Name: Jane Patrick   MRN: [id number]    DOB: 09/23/1960   Date:10/09/2019       Progress Note  Subjective  Chief Complaint  Chief Complaint  Patient presents with  . Depression    I connected with  [name] on 10/09/19 at  8:20 AM EDT by telephone and verified that I am speaking with the correct person using two identifiers.  I discussed the limitations, risks, security and privacy concerns of performing an evaluation and management service by telephone and the availability of in person appointments. Staff also discussed with the patient that there may be a patient responsible charge related to this service. Patient Location: at home  Provider Location: [facility]   HPI  Chronic back pain: : she is seeing [name] and had MRI done 03/30/2019, and showed L2- 3 Disc bulge , and also a hypointense bone lesion, repeat Feb 2021 showed stability . She continues to have daily pain, she is still taking Tramadol Pain level at this time is 4/10, usually like that when she first gets up in the mornings She states when standing to cook, or walking when she goes groceries shopping   Migraine headache: she is on Topamax for prevention, usually triggered by barometric pressure changes.Imitrex helps with symptoms, takes about 45 minutes to resolve the symptoms and she feels groggy the following day. She has been taking [place] and has been responding well. She does not like Imitrex side effects and is doing better on Ubrelvy   Abnormal pap smear in Oct 06, 2010: she was referred to [facility], and advised to see [name].  She had a TAH in Oct 06, 2002 for treatment of abnormal cervical cells. She was seen by [name] at [facility] and last pap done in Feb 2017 was normal with positive HPV , October 2017, she missed appointment 02/2017 , she lost her job and has to re-schedule that she has insurance.No vaginal bleeding or cramping. She had repeat pap smear 05/2019 by gyn and was normal   Herpes Type  II: she had a small lesion when she was seen by [name] and culture was positive for herpes, she was given Valtrex, no other outbreaks since. Doing well   Hematuria: seen at [facility] in Oct 06, 2010, had multiple CT, last one in 2010-10-06 and stable liver lesions no further evaluation, patient states also told the same by Urologist for negative hematuria evaluation.Does not have follow up  ADD: she lost her job May 2019,but is working again, The current , company is in [place], she has been working for them since Nov 2019. Sheis now on Vyvanse40 mgand she states it is working longer than 30 mg dose , however keeps her up at night when she does not take it right away, we switched back to Adderal in January 2021 but not taking it daily, feeling tired and taking naps, advised to try resuming medication daily   COPD Mild:Last flare was Dec 2019 took Zpack and tessalon perles.. No longer smoking - quit about 12years ago. She was using Combivent more often end of 2020 we added Anoro but it caused palpitation. We switched to Incruse but she is not taking it either.   B12 deficiency: found in June 2016,currently only using SL B12 daily, she will stop by to have labs done this week   Major Depression in partial Remission: on Pristiq andoff AlprazolamSymptoms started when husband was diagnosed with lung cancer and died in Oct 05, 2008.CurrentlyDuloxetine, no side effects,Phq 9 went up  to 13 on her last visit and today is down to 9, she states fatigue has been intense. She got engaged on Valentine's day, still not talking to oldest daughter, but sees her grandchildren.  Hypothyroidism: she is taking levothyroxine, but feeling super tired lately   Atherosclerosis of aorta: she prefers not taking rx medication, she will try red rice yeast at home.She will return for labs this week   Dysphagia:seen by Dr. Marius Ditch and had dilation done 08/2018 and is doing well since No dysphagia . She is taking PPI and  symptoms are controlled Unchanged  Patient Active Problem List   Diagnosis Date Noted  . Esophageal dysphagia   . Adult hypothyroidism 08/15/2017  . Atherosclerosis of aorta ([place]) 09/22/2016  . Hiatal hernia 09/22/2016  . Abnormal liver CT 09/11/2016  . H/O herpes simplex type 2 infection 08/20/2015  . Hematuria 02/19/2015  . B12 deficiency 02/19/2015  . Attention deficit hyperactivity disorder (ADHD), predominantly inattentive type 11/13/2014  . COPD, mild ([place]) 11/13/2014  . Major depression in partial remission ([place]) 11/13/2014  . History of basal cell carcinoma 11/13/2014  . Gastroesophageal reflux disease without esophagitis 11/13/2014  . History of abnormal cervical Pap smear 11/13/2014  . Migraine without aura and without status migrainosus, not intractable 11/13/2014  . Vitamin D deficiency 11/13/2014  . Liver lesion 10/04/2010    Past Surgical History:  Procedure Laterality Date  . ABDOMINAL HYSTERECTOMY  2004   partial  . BREAST SURGERY     biopsies  . ESOPHAGOGASTRODUODENOSCOPY (EGD) WITH PROPOFOL N/A 08/02/2018   Procedure: ESOPHAGOGASTRODUODENOSCOPY (EGD) WITH PROPOFOL;  Surgeon: Lin Landsman, MD;  Location: [place];  Service: Gastroenterology;  Laterality: N/A;  . FRACTURE SURGERY     rode in femur  . POLYPECTOMY     vocal chord polyps    Family History  Problem Relation Age of Onset  . Colon cancer Father   . Breast cancer Sister   . Diverticulitis Brother   . Diverticulitis Brother     Social History   Tobacco Use  . Smoking status: Former Smoker    Packs/day: 1.00    Years: 25.00    Pack years: 25.00    Types: Cigarettes    Quit date: 05/25/2004    Years since quitting: 15.3  . Smokeless tobacco: Never Used  Substance Use Topics  . Alcohol use: No    Alcohol/week: 0.0 standard drinks    Current Outpatient Medications:  .  amphetamine-dextroamphetamine (ADDERALL XR) 15 MG 24 hr capsule, Take 1 capsule by mouth every morning.,  Disp: 30 capsule, Rfl: 0 .  cholecalciferol (VITAMIN D) 1000 UNITS tablet, Take 2,000 Units by mouth daily., Disp: , Rfl:  .  COMBIVENT RESPIMAT 20-100 MCG/ACT AERS respimat, INHALE 1 PUFF INTO THE LUNGS EVERY 6 HOURS AS NEEDED FOR WHEEZING OR SHORTNESS OF BREATH, Disp: 12 g, Rfl: 1 .  DULoxetine (CYMBALTA) 60 MG capsule, Take 1 capsule (60 mg total) by mouth daily., Disp: 90 capsule, Rfl: 1 .  hydrOXYzine (ATARAX/VISTARIL) 10 MG tablet, TAKE 1 TABLET(10 MG) BY MOUTH AT BEDTIME, Disp: 90 tablet, Rfl: 3 .  ibuprofen (ADVIL,MOTRIN) 200 MG tablet, Take by mouth., Disp: , Rfl:  .  levothyroxine (SYNTHROID) 25 MCG tablet, TAKE 1 TABLET BY MOUTH ON TUESDAY, THURSDAY,SATURDAY,SUNDAY AND 2 TABLETS ON MONDAY, WEDNESDAY AND FRIDAY, Disp: 45 tablet, Rfl: 0 .  omeprazole (PRILOSEC) 40 MG capsule, Take 1 capsule (40 mg total) by mouth daily., Disp: 90 capsule, Rfl: 1 .  SUMAtriptan (IMITREX) 100  MG tablet, Take 1 tablet (100 mg total) by mouth as needed for migraine. May repeat in 2 hours if headache persists or recurs., Disp: 18 tablet, Rfl: 1 .  topiramate (TOPAMAX) 100 MG tablet, Take 1 tablet (100 mg total) by mouth 2 (two) times daily., Disp: 180 tablet, Rfl: 1 .  traMADol (ULTRAM) 50 MG tablet, Take 1 tablet by mouth 2 (two) times daily as needed., Disp: , Rfl:  .  Ubrogepant (UBRELVY) 100 MG TABS, Take 1 tablet by mouth daily as needed., Disp: 10 tablet, Rfl: 2 .  valACYclovir (VALTREX) 500 MG tablet, TAKE 2 TABLETS BY MOUTH TWICE DAILY FOR 10 DAYS, Disp: 20 tablet, Rfl: 0 .  umeclidinium bromide (INCRUSE ELLIPTA) 62.5 MCG/INH AEPB, Inhale 1 puff into the lungs daily. (Patient not taking: Reported on 10/09/2019), Disp: 60 each, Rfl: 2  Allergies  Allergen Reactions  . Codeine   . Pantoprazole     Flu like syptoms    I personally reviewed active problem list, medication list, allergies, family history, social history, health maintenance with the patient/caregiver today.   ROS  Ten systems reviewed  and is negative except as mentioned in HPI   Objective  Virtual encounter, vitals not obtained.  There is no height or weight on file to calculate BMI.  Physical Exam  Awake, alert and oriented   PHQ2/9: Depression screen [facility] 2/9 10/09/2019 07/12/2019 05/17/2019 04/11/2019 01/04/2019  Decreased Interest 0 1 1 0 0  Down, Depressed, Hopeless 0 2 1 0 0  PHQ - 2 Score 0 3 2 0 0  Altered sleeping 0 3 0 1 3  Tired, decreased energy 3 3 0 3 3  Change in appetite 3 1 0 3 0  Feeling bad or failure about yourself  0 1 0 0 0  Trouble concentrating 3 2 0 0 0  Moving slowly or fidgety/restless 0 0 0 0 0  Suicidal thoughts 0 0 0 0 0  PHQ-9 Score 9 13 2 7 6   Difficult doing work/chores Extremely dIfficult Somewhat difficult Not difficult at all Not difficult at all Not difficult at all  Some recent data might be hidden   PHQ-2/9 Result is negative.    Fall Risk: Fall Risk  10/09/2019 07/12/2019 05/17/2019 04/11/2019 01/04/2019  Falls in the past year? 0 0 0 1 1  Number falls in past yr: 0 0 0 0 0  Comment - - - - tripped over dog and hurt left hand (per pt not major)  Injury with Fall? 0 0 0 0 1  Follow up - - Falls evaluation completed - Falls evaluation completed     Assessment & Plan  There are no diagnoses linked to this encounter.  I discussed the assessment and treatment plan with the patient. The patient was provided an opportunity to ask questions and all were answered. The patient agreed with the plan and demonstrated an understanding of the instructions.   The patient was advised to call back or seek an in-person evaluation if the symptoms worsen or if the condition fails to improve as anticipated.  I provided 25   minutes of non-face-to-face time during this encounter.  03/06/2019, MD

## 2019-10-13 ENCOUNTER — Telehealth: Payer: Self-pay

## 2019-10-13 NOTE — Telephone Encounter (Signed)
Copied from CRM [id number]. Topic: General - Other >> Oct 13, 2019 12:15 PM Maebelle Munroe wrote: Reason for CRM: Patient calling back to inform dr Carlynn Purl nurse of the LOT# for the COVID vaccines she received  Pfizer dose 1 3/25 lot# [id number] dose 24/15 lot# [id number]

## 2019-10-13 NOTE — Telephone Encounter (Signed)
Immunizations have been updated

## 2019-10-30 ENCOUNTER — Other Ambulatory Visit: Payer: Self-pay | Admitting: Family Medicine

## 2019-10-30 DIAGNOSIS — E039 Hypothyroidism, unspecified: Secondary | ICD-10-CM

## 2019-11-14 ENCOUNTER — Other Ambulatory Visit: Payer: Self-pay | Admitting: Family Medicine

## 2019-11-14 ENCOUNTER — Encounter: Payer: Self-pay | Admitting: Family Medicine

## 2019-11-14 DIAGNOSIS — J449 Chronic obstructive pulmonary disease, unspecified: Secondary | ICD-10-CM

## 2019-11-14 DIAGNOSIS — F900 Attention-deficit hyperactivity disorder, predominantly inattentive type: Secondary | ICD-10-CM

## 2019-11-14 DIAGNOSIS — F321 Major depressive disorder, single episode, moderate: Secondary | ICD-10-CM

## 2019-11-14 MED ORDER — AMPHETAMINE-DEXTROAMPHET ER 15 MG PO CP24: 15.0000 mg | ORAL_CAPSULE | ORAL | 0 refills | Status: DC

## 2019-11-14 NOTE — Telephone Encounter (Signed)
Requested Prescriptions  Pending Prescriptions Disp Refills  . COMBIVENT RESPIMAT 20-100 MCG/ACT AERS respimat [Pharmacy Med Name: COMBIVENT RESPIMAT ORAL 120SPRAY 4G] 12 g 1    Sig: INHALE 1 PUFF INTO THE LUNGS EVERY 6 HOURS AS NEEDED FOR WHEEZING OR SHORTNESS OF BREATH     Pulmonology:  Combination Products Passed - 11/14/2019  6:21 AM      Passed - Valid encounter within last 12 months    Recent Outpatient Visits          1 month ago Moderate major depression [facility])   [facility] Alba Cory, MD   4 months ago Moderate major depression [facility])   [facility] Alba Cory, MD   6 months ago Dysuria   [facility] Alba Cory, MD   7 months ago COPD, mild [facility])   [facility] Alba Cory, MD   10 months ago Hypothyroidism in adult   [facility] Alba Cory, MD      Future Appointments            In 1 month Carlynn Purl, Danna Hefty, MD [facility]

## 2020-01-10 NOTE — Progress Notes (Signed)
Name: Jane Patrick   MRN: [id number]    DOB: 09/15/1960   Date:01/11/2020       Progress Note  Subjective  Chief Complaint  Chief Complaint  Patient presents with   Depression   COPD   Migraine    HPI  Left hip pain: she states noticed some left groin for the past few days, but yesterday it was severe with certain movements, pain is sharp and intense and does not radiate, worse when pivoting her leg. She is walking very slowly because she is worried it will triggered the pain. No pain when standing still or bouncing or sitting, she can only walk on a straight line. No previous history of hip problems, took ibuprofen last night but no change in symptoms.   Chronic back pain: : she is seeing Dr. Council Mechanichasniss and had MRI done 03/30/2019, and showed L2- 3 Disc bulge , and also a hypointense bone lesion, repeat Feb 2021 showed stability. She continues to have daily pain, no pain at rest, but goes up to  3- 4/10, usually like that when she first gets up in the mornings She states when standing to cook, or walking when she goes groceries shopping . She denies any worsening of pain, taking Tramadol given by Dr. Leeanne Riohaniss   Migraine headache: she is on Topamax for prevention, usually triggered by barometric pressure changes.Imitrex helps with symptoms, takes about 45 minutes to resolve the symptoms and she feels groggy the following day, she has been taking Ubrelvy lately and takes longer to control symptoms but does not cause sedation  Abnormal pap smear in 2012: [facility], and advised to see Dr. Mariann Barterahangdale.  She had a TAH in 2004 for treatment of abnormal cervical cells. She was [facility] and last pap done in Feb 2017 was normal with positive HPV , October 2017, she missed appointment 02/2017 , she lost her job and has to re-schedule that she has insurance.No vaginal bleeding or cramping. She had repeat pap smear 05/2019 by gyn and was normal. Unchanged    Herpes Type II: she had a small lesion when she was seen by Dr. Dalbert GarnetBeasley and culture was positive for herpes, she was given Valtrex, no other outbreaks since. She still has medication at home   Hematuria: 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 She was on Vyvanse but it was causing her to stay up at night , we switched back to Adderal in January 2021, she used to be on 10 mg immediate release 2 in am and 1 pm, but since we resumed medication we tried Adderal XR 15 mg. She states medication takes too long to work and she is feeling sluggish and unable to focus on this dose. We will try higher dose and if it doesn't work she will call back and either ask for 25 mg or switch back to 10 mg pills two in am and one in pm   COPD Mild:Last flare was Dec 2019 took Zpack and tessalon perles.. No longer smoking - quit about 12years ago. She was using Combivent more often end of 2020 we added Anoro but she stopped  because it caused palpitation, however she noticed this summer she has been using rescue inhaler more often, so she used Anoro again and it really helped control symptoms and would like to resume it   B12 deficiency: found in June 2016,currently only using SL B12 daily, we will recheck labs today   Major Depression in partial Remission: on Pristiq andoff AlprazolamSymptoms started when husband was diagnosed with lung cancer and died in 2008-09-06.CurrentlyDuloxetine, no side effects,Phq 9 went up to 13 on her last visit and today is down to 9, she states fatigue has been intense. She got engaged on Valentine's day, still not talking to oldest daughter, but sees her  grandchildren.  Hypothyroidism: she is taking levothyroxine, but feeling super tired lately , we will recheck level today, no hair loss, no change in bowel movements   Atherosclerosis of aorta: she prefers not taking rx medication, she will try red rice yeast at home.She will have labs today   Dysphagia/GERD:seen by Dr. Allegra Lai and had dilation done 2018/09/07 and is doing well since No dysphagia . She istaking PPI and symptoms are controlled   Patient Active Problem List   Diagnosis Date Noted   Esophageal dysphagia    Adult hypothyroidism 08/15/2017   Atherosclerosis of aorta (HCC) 09/22/2016   Hiatal hernia 09/22/2016   Abnormal liver CT 09/11/2016   H/O herpes simplex type 2 infection 08/20/2015   Hematuria 02/19/2015   B12 deficiency 02/19/2015   Attention deficit hyperactivity disorder (ADHD), predominantly inattentive type 11/13/2014   COPD, mild (HCC) 11/13/2014   Major depression in partial remission (HCC) 11/13/2014   History of basal cell carcinoma 11/13/2014   Gastroesophageal reflux disease without esophagitis 11/13/2014   History of abnormal cervical Pap smear 11/13/2014   Migraine without aura and without status migrainosus, not intractable 11/13/2014   Vitamin D deficiency 11/13/2014   Liver lesion 10/04/2010    Past Surgical History:  Procedure Laterality Date   ABDOMINAL HYSTERECTOMY  09-07-2002   partial   BREAST SURGERY     biopsies   ESOPHAGOGASTRODUODENOSCOPY (EGD) WITH PROPOFOL N/A 08/02/2018   Procedure: ESOPHAGOGASTRODUODENOSCOPY (EGD) WITH PROPOFOL;  Surgeon: [name], MD;  Location: [facility];  Service: Gastroenterology;  Laterality: N/A;   FRACTURE SURGERY     rode in femur   POLYPECTOMY     vocal chord polyps    Family History  Problem Relation Age of Onset   Colon cancer Father    Breast cancer Sister    Diverticulitis Brother    Diverticulitis Brother     Social History   Tobacco Use   Smoking  status: Former Smoker    Packs/day: 1.00    Years: 25.00    Pack years: 25.00    Types: Cigarettes    Quit date: 05/25/2004    Years since quitting: 15.6   Smokeless tobacco: Never Used  Substance Use Topics   Alcohol use: No    Alcohol/week: 0.0 standard drinks     Current Outpatient Medications:    amphetamine-dextroamphetamine (ADDERALL XR) 20 MG 24 hr capsule, Take 1 capsule (20 mg total) by mouth every morning., Disp: 30 capsule, Rfl: 0   ANORO ELLIPTA 62.5-25 MCG/INH AEPB, Inhale 1 puff into the lungs daily., Disp: 60 each, Rfl: 5   cholecalciferol (VITAMIN D) 1000 UNITS tablet, Take 2,000 Units by mouth daily., Disp: , Rfl:    COMBIVENT RESPIMAT 20-100 MCG/ACT AERS respimat, INHALE 1 PUFF INTO THE LUNGS EVERY 6 HOURS AS NEEDED FOR WHEEZING OR SHORTNESS OF  BREATH, Disp: 12 g, Rfl: 1   DULoxetine (CYMBALTA) 60 MG capsule, Take 1 capsule (60 mg total) by mouth daily., Disp: 90 capsule, Rfl: 1   hydrOXYzine (ATARAX/VISTARIL) 25 MG tablet, Take 1 tablet (25 mg total) by mouth at bedtime., Disp: 90 tablet, Rfl: 0   ibuprofen (ADVIL,MOTRIN) 200 MG tablet, Take by mouth., Disp: , Rfl:    levothyroxine (SYNTHROID) 25 MCG tablet, TAKE 1 TABLET BY MOUTH ON TUESDAY, THURSDAY, SATURDAY, SUNDAY AND 2 TABLETS BY MOUTH ON MONDAY, WEDNESDAY AND FRIDAY, Disp: 135 tablet, Rfl: 0   omeprazole (PRILOSEC) 40 MG capsule, Take 1 capsule (40 mg total) by mouth daily., Disp: 90 capsule, Rfl: 1   SUMAtriptan (IMITREX) 100 MG tablet, Take 1 tablet (100 mg total) by mouth as needed for migraine. May repeat in 2 hours if headache persists or recurs., Disp: 18 tablet, Rfl: 1   topiramate (TOPAMAX) 100 MG tablet, Take 1 tablet (100 mg total) by mouth 2 (two) times daily., Disp: 180 tablet, Rfl: 1   traMADol (ULTRAM) 50 MG tablet, Take 1 tablet by mouth 2 (two) times daily as needed., Disp: , Rfl:    Ubrogepant (UBRELVY) 100 MG TABS, Take 1 tablet by mouth daily as needed., Disp: 10 tablet, Rfl: 2    valACYclovir (VALTREX) 500 MG tablet, TAKE 2 TABLETS BY MOUTH TWICE DAILY FOR 10 DAYS, Disp: 20 tablet, Rfl: 0  Allergies  Allergen Reactions   Codeine    Pantoprazole     Flu like syptoms    I personally reviewed active problem list, medication list, allergies, family history, social history, health maintenance with the patient/caregiver today.   ROS  Constitutional: Negative for fever or weight change.  Respiratory: positive  for cough and shortness of breath.   Cardiovascular: Negative for chest pain or palpitations.  Gastrointestinal: Negative for abdominal pain, no bowel changes.  Musculoskeletal: positive  for gait problem but no  joint swelling.  Skin: Negative for rash.  Neurological: Negative for dizziness, positive for intermittent  headache.  No other specific complaints in a complete review of systems (except as listed in HPI above).  Objective  Vitals:   01/11/20 0752  BP: 140/80  Pulse: 89  Resp: 16  Temp: 98.1 F (36.7 C)  TempSrc: Oral  SpO2: 97%  Weight: 173 lb 11.2 oz (78.8 kg)  Height: 5\' 3"  (1.6 m)    Body mass index is 30.77 kg/m.  Physical Exam  Constitutional: Patient appears well-developed and well-nourished. Obese No distress.  HEENT: head atraumatic, normocephalic, pupils equal and reactive to light, neck supple Cardiovascular: Normal rate, regular rhythm and normal heart sounds.  No murmur heard. No BLE edema. Pulmonary/Chest: Effort normal and breath sounds normal. No respiratory distress. Abdominal: Soft.  There is no tenderness. Muscular Skeletal: some swelling on left lower back, no pain during internal or external rotation of hip, negative straight leg raise, no pain during palpation of left groin Psychiatric: Patient has a normal mood and affect. behavior is normal. Judgment and thought content normal.  PHQ2/9: Depression screen [facility] 2/9 01/11/2020 10/09/2019 07/12/2019 05/17/2019 04/11/2019  Decreased Interest 0 0 1 1 0  Down,  Depressed, Hopeless 0 0 2 1 0  PHQ - 2 Score 0 0 3 2 0  Altered sleeping 2 0 3 0 1  Tired, decreased energy 3 3 3  0 3  Change in appetite 2 3 1  0 3  Feeling bad or failure about yourself  0 0 1 0 0  Trouble concentrating 0  3 2 0 0  Moving slowly or fidgety/restless 0 0 0 0 0  Suicidal thoughts 0 0 0 0 0  PHQ-9 Score 7 9 13 2 7   Difficult doing work/chores Somewhat difficult Extremely dIfficult Somewhat difficult Not difficult at all Not difficult at all  Some recent data might be hidden    phq 9 is positive   Fall Risk: Fall Risk  01/11/2020 10/09/2019 07/12/2019 05/17/2019 04/11/2019  Falls in the past year? 0 0 0 0 1  Number falls in past yr: 0 0 0 0 0  Comment - - - - -  Injury with Fall? 0 0 0 0 0  Follow up - - - Falls evaluation completed -      Assessment & Plan  1. Need for shingles vaccine  - Varicella-zoster vaccine IM (Shingrix)  2. COPD, mild (HCC)  - ANORO ELLIPTA 62.5-25 MCG/INH AEPB; Inhale 1 puff into the lungs daily.  Dispense: 60 each; Refill: 5  3. Moderate major depression (HCC)  - DULoxetine (CYMBALTA) 60 MG capsule; Take 1 capsule (60 mg total) by mouth daily.  Dispense: 90 capsule; Refill: 1 - amphetamine-dextroamphetamine (ADDERALL XR) 20 MG 24 hr capsule; Take 1 capsule (20 mg total) by mouth every morning.  Dispense: 30 capsule; Refill: 0  4. Attention deficit hyperactivity disorder (ADHD), predominantly inattentive type   5. Vitamin D deficiency  Recheck level   6. B12 deficiency  Recheck level   7. GERD without esophagitis  - omeprazole (PRILOSEC) 40 MG capsule; Take 1 capsule (40 mg total) by mouth daily.  Dispense: 90 capsule; Refill: 1  8. Hypothyroidism in adult  Recheck TSH today   9. Migraine without aura and without status migrainosus, not intractable  - Ubrogepant (UBRELVY) 100 MG TABS; Take 1 tablet by mouth daily as needed.  Dispense: 10 tablet; Refill: 2 - topiramate (TOPAMAX) 100 MG tablet; Take 1 tablet (100 mg  total) by mouth 2 (two) times daily.  Dispense: 180 tablet; Refill: 1  10. Dyslipidemia   11. Atherosclerosis of aorta (HCC)   12. S/P balloon dilatation of esophageal stricture  - omeprazole (PRILOSEC) 40 MG capsule; Take 1 capsule (40 mg total) by mouth daily.  Dispense: 90 capsule; Refill: 1  13. Groin pain, chronic, left  - predniSONE (STERAPRED UNI-PAK 21 TAB) 10 MG (21) TBPK tablet; Take as directed  Dispense: 1 each; Refill: 0

## 2020-01-11 ENCOUNTER — Ambulatory Visit: Payer: 59 | Admitting: Family Medicine

## 2020-01-11 ENCOUNTER — Other Ambulatory Visit: Payer: Self-pay

## 2020-01-11 ENCOUNTER — Encounter: Payer: Self-pay | Admitting: Family Medicine

## 2020-01-11 VITALS — BP 140/80 | HR 89 | Temp 98.1°F | Resp 16 | Ht 63.0 in | Wt 173.7 lb

## 2020-01-11 DIAGNOSIS — Z9889 Other specified postprocedural states: Secondary | ICD-10-CM

## 2020-01-11 DIAGNOSIS — F900 Attention-deficit hyperactivity disorder, predominantly inattentive type: Secondary | ICD-10-CM | POA: Diagnosis not present

## 2020-01-11 DIAGNOSIS — F321 Major depressive disorder, single episode, moderate: Secondary | ICD-10-CM

## 2020-01-11 DIAGNOSIS — K219 Gastro-esophageal reflux disease without esophagitis: Secondary | ICD-10-CM

## 2020-01-11 DIAGNOSIS — G43009 Migraine without aura, not intractable, without status migrainosus: Secondary | ICD-10-CM

## 2020-01-11 DIAGNOSIS — J449 Chronic obstructive pulmonary disease, unspecified: Secondary | ICD-10-CM

## 2020-01-11 DIAGNOSIS — R1032 Left lower quadrant pain: Secondary | ICD-10-CM

## 2020-01-11 DIAGNOSIS — Z23 Encounter for immunization: Secondary | ICD-10-CM

## 2020-01-11 DIAGNOSIS — G8929 Other chronic pain: Secondary | ICD-10-CM

## 2020-01-11 DIAGNOSIS — I700 Atherosclerosis of aorta: Secondary | ICD-10-CM

## 2020-01-11 DIAGNOSIS — E785 Hyperlipidemia, unspecified: Secondary | ICD-10-CM

## 2020-01-11 DIAGNOSIS — E538 Deficiency of other specified B group vitamins: Secondary | ICD-10-CM

## 2020-01-11 DIAGNOSIS — E039 Hypothyroidism, unspecified: Secondary | ICD-10-CM

## 2020-01-11 DIAGNOSIS — E559 Vitamin D deficiency, unspecified: Secondary | ICD-10-CM

## 2020-01-11 MED ORDER — TOPIRAMATE 100 MG PO TABS: 100.0000 mg | ORAL_TABLET | Freq: Two times a day (BID) | ORAL | 1 refills | Status: DC

## 2020-01-11 MED ORDER — AMPHETAMINE-DEXTROAMPHET ER 20 MG PO CP24: 20.0000 mg | ORAL_CAPSULE | ORAL | 0 refills | Status: DC

## 2020-01-11 MED ORDER — DULOXETINE HCL 60 MG PO CPEP: 60.0000 mg | ORAL_CAPSULE | Freq: Every day | ORAL | 1 refills | Status: DC

## 2020-01-11 MED ORDER — ANORO ELLIPTA 62.5-25 MCG/INH IN AEPB: 1.0000 | INHALATION_SPRAY | Freq: Every day | RESPIRATORY_TRACT | 5 refills | Status: DC

## 2020-01-11 MED ORDER — PREDNISONE 10 MG (21) PO TBPK: ORAL_TABLET | ORAL | 0 refills | Status: DC

## 2020-01-11 MED ORDER — UBRELVY 100 MG PO TABS: 1.0000 | ORAL_TABLET | Freq: Every day | ORAL | 2 refills | Status: DC | PRN

## 2020-01-11 MED ORDER — OMEPRAZOLE 40 MG PO CPDR: 40.0000 mg | DELAYED_RELEASE_CAPSULE | Freq: Every day | ORAL | 1 refills | Status: DC

## 2020-01-12 LAB — CBC WITH DIFFERENTIAL/PLATELET
Absolute Monocytes: 348 cells/uL (ref 200–950)
Basophils Absolute: 29 cells/uL (ref 0–200)
Basophils Relative: 0.6 %
Eosinophils Absolute: 98 cells/uL (ref 15–500)
Eosinophils Relative: 2 %
HCT: 42.2 % (ref 35.0–45.0)
Hemoglobin: 14 g/dL (ref 11.7–15.5)
Lymphs Abs: 1490 cells/uL (ref 850–3900)
MCH: 27.6 pg (ref 27.0–33.0)
MCHC: 33.2 g/dL (ref 32.0–36.0)
MCV: 83.1 fL (ref 80.0–100.0)
MPV: 9.5 fL (ref 7.5–12.5)
Monocytes Relative: 7.1 %
Neutro Abs: 2935 cells/uL (ref 1500–7800)
Neutrophils Relative %: 59.9 %
Platelets: 194 10*3/uL (ref 140–400)
RBC: 5.08 10*6/uL (ref 3.80–5.10)
RDW: 14.4 % (ref 11.0–15.0)
Total Lymphocyte: 30.4 %
WBC: 4.9 10*3/uL (ref 3.8–10.8)

## 2020-01-12 LAB — HEMOGLOBIN A1C
Hgb A1c MFr Bld: 5.8 % of total Hgb — ABNORMAL HIGH (ref <5.7)
Mean Plasma Glucose: 120 (calc)
eAG (mmol/L): 6.6 (calc)

## 2020-01-12 LAB — LIPID PANEL
Cholesterol: 231 mg/dL — ABNORMAL HIGH (ref <200)
HDL: 51 mg/dL (ref >=50)
LDL Cholesterol (Calc): 150 mg/dL (calc) — ABNORMAL HIGH
Non-HDL Cholesterol (Calc): 180 mg/dL (calc) — ABNORMAL HIGH (ref <130)
Total CHOL/HDL Ratio: 4.5 (calc) (ref <5.0)
Triglycerides: 163 mg/dL — ABNORMAL HIGH (ref <150)

## 2020-01-12 LAB — COMPLETE METABOLIC PANEL WITH GFR
AG Ratio: 1.6 (calc) (ref 1.0–2.5)
ALT: 15 U/L (ref 6–29)
AST: 14 U/L (ref 10–35)
Albumin: 4.4 g/dL (ref 3.6–5.1)
Alkaline phosphatase (APISO): 98 U/L (ref 37–153)
BUN: 22 mg/dL (ref 7–25)
CO2: 28 mmol/L (ref 20–32)
Calcium: 9.3 mg/dL (ref 8.6–10.4)
Chloride: 105 mmol/L (ref 98–110)
Creat: 1.05 mg/dL (ref 0.50–1.05)
GFR, Est African American: 68 mL/min/{1.73_m2} (ref >=60)
GFR, Est Non African American: 59 mL/min/{1.73_m2} — ABNORMAL LOW (ref >=60)
Globulin: 2.7 g/dL (calc) (ref 1.9–3.7)
Glucose, Bld: 160 mg/dL — ABNORMAL HIGH (ref 65–99)
Potassium: 4.1 mmol/L (ref 3.5–5.3)
Sodium: 140 mmol/L (ref 135–146)
Total Bilirubin: 0.4 mg/dL (ref 0.2–1.2)
Total Protein: 7.1 g/dL (ref 6.1–8.1)

## 2020-01-12 LAB — VITAMIN B12: Vitamin B-12: 408 pg/mL (ref 200–1100)

## 2020-01-12 LAB — VITAMIN D 25 HYDROXY (VIT D DEFICIENCY, FRACTURES): Vit D, 25-Hydroxy: 31 ng/mL (ref 30–100)

## 2020-01-12 LAB — TSH: TSH: 3.93 mIU/L (ref 0.40–4.50)

## 2020-01-15 ENCOUNTER — Encounter: Payer: Self-pay | Admitting: Family Medicine

## 2020-03-08 ENCOUNTER — Encounter: Payer: Self-pay | Admitting: Family Medicine

## 2020-03-11 ENCOUNTER — Other Ambulatory Visit: Payer: Self-pay | Admitting: Family Medicine

## 2020-03-11 MED ORDER — AMPHETAMINE-DEXTROAMPHETAMINE 10 MG PO TABS: 10.0000 mg | ORAL_TABLET | Freq: Two times a day (BID) | ORAL | 0 refills | Status: DC

## 2020-03-12 ENCOUNTER — Other Ambulatory Visit: Payer: Self-pay | Admitting: Family Medicine

## 2020-03-12 DIAGNOSIS — E039 Hypothyroidism, unspecified: Secondary | ICD-10-CM

## 2020-04-15 ENCOUNTER — Other Ambulatory Visit: Payer: Self-pay

## 2020-04-15 ENCOUNTER — Encounter: Payer: Self-pay | Admitting: Family Medicine

## 2020-04-15 ENCOUNTER — Ambulatory Visit: Payer: 59 | Admitting: Family Medicine

## 2020-04-15 VITALS — BP 130/84 | HR 87 | Temp 98.0°F | Resp 16 | Ht 63.0 in | Wt 176.8 lb

## 2020-04-15 DIAGNOSIS — E559 Vitamin D deficiency, unspecified: Secondary | ICD-10-CM

## 2020-04-15 DIAGNOSIS — K219 Gastro-esophageal reflux disease without esophagitis: Secondary | ICD-10-CM

## 2020-04-15 DIAGNOSIS — F321 Major depressive disorder, single episode, moderate: Secondary | ICD-10-CM | POA: Diagnosis not present

## 2020-04-15 DIAGNOSIS — E538 Deficiency of other specified B group vitamins: Secondary | ICD-10-CM

## 2020-04-15 DIAGNOSIS — J449 Chronic obstructive pulmonary disease, unspecified: Secondary | ICD-10-CM

## 2020-04-15 DIAGNOSIS — E785 Hyperlipidemia, unspecified: Secondary | ICD-10-CM

## 2020-04-15 DIAGNOSIS — F900 Attention-deficit hyperactivity disorder, predominantly inattentive type: Secondary | ICD-10-CM | POA: Diagnosis not present

## 2020-04-15 DIAGNOSIS — Z9889 Other specified postprocedural states: Secondary | ICD-10-CM

## 2020-04-15 DIAGNOSIS — Z23 Encounter for immunization: Secondary | ICD-10-CM

## 2020-04-15 DIAGNOSIS — I700 Atherosclerosis of aorta: Secondary | ICD-10-CM

## 2020-04-15 DIAGNOSIS — G43009 Migraine without aura, not intractable, without status migrainosus: Secondary | ICD-10-CM

## 2020-04-15 DIAGNOSIS — E039 Hypothyroidism, unspecified: Secondary | ICD-10-CM

## 2020-04-15 MED ORDER — TEMAZEPAM 15 MG PO CAPS: 15.0000 mg | ORAL_CAPSULE | Freq: Every evening | ORAL | 0 refills | Status: DC | PRN

## 2020-04-15 MED ORDER — LISDEXAMFETAMINE DIMESYLATE 30 MG PO CAPS: 30.0000 mg | ORAL_CAPSULE | Freq: Every day | ORAL | 0 refills | Status: DC

## 2020-04-15 MED ORDER — LEVOTHYROXINE SODIUM 25 MCG PO TABS: 25.0000 ug | ORAL_TABLET | Freq: Every day | ORAL | 0 refills | Status: DC

## 2020-04-15 NOTE — Progress Notes (Signed)
Name: Jane Patrick   MRN: [id number]    DOB: [birth year]   Date:04/15/2020       Progress Note  Subjective  Chief Complaint  Follow up  HPI    Thumb pain: right thumb, worse at the end of the day, burns, sharp , improves with a brace, discussed it could be OA, tendinitis or radiculitis. Advised ice, rest and brace since pain not constant  Chronic back pain: : she is seeing [name] and had MRI done 03/30/2019, and showed L2- 3 Disc bulge , and also a hypointense bone lesion, repeat Feb 2021 showed stability. She continues to have daily pain, no pain at rest, but goes up to  3/10, usually like that when she first gets up in the mornings She states when standing to cook, or walking when she goes groceries shopping . She denies any worsening of pain, taking Tramadol given by Dr. Leeanne Rio She also has left hip pain and has discussed with [name]   Migraine headache: she is on Topamax for prevention, usually triggered by barometric pressure changes.Imitrex helps with symptoms, takes about 45 minutes to resolve the symptoms and she feels groggy the following day, she has been taking Ubrelvy lately and takes longer to control symptoms but does not cause sedation, she states took rescue medication only a couple of times in the last three months   Abnormal pap smear in 2010-10-01: she was referred to [facility], and advised to see [name].  She had a TAH in 10-01-02 for treatment of abnormal cervical cells. She was seen by [name] at [facility] and last pap done in Feb 2017 was normal with positive HPV , October 2017 She had repeat pap smear 05/2019 by gyn and was normal. Continue regular follow ups   Herpes Type II: she had a small lesion when she was seen by [name] and culture was positive for herpes, she was given Valtrex, no other outbreaks since. No recent outbreaks  Hematuria: seen at [facility] in 10-01-10, had multiple CT, last one in 10-01-2010 and stable liver lesions no further  evaluation, patient states also told the same by Urologist for negative hematuria evaluation.   ADD: she lost her job May 2019,but is working again, The current , company is in [place], she has been working for them since Nov 2019. She was on Vyvanse but it was causing her to stay up at night , we switched back to Adderal in January 2021, she was on  10 mg immediate release 2 in am and 1 pm, we switched to 15 mg ER and felt more sluggish, we changed back to immediate release. Discussed importance of physical activity    COPD Mild:Last flare was Dec 2019 took Zpack and tessalon perles.. No longer smoking - quit about 12years ago. She was using Combivent more often end of 2020 we added Anoro but she stopped because it caused palpitation, but she was able to resume it on her own and has been doing well.   B12 deficiency: found in June 2016, last level was  408, she has not been taking supplementation   Major Depression in partial Remission:  Symptoms started when husband was diagnosed with lung cancer and died in 2008-09-30.CurrentlyDuloxetine, no side effects,Phq 9 still positive at 8,  she states fatigue has been intense. She got engaged on Valentine's day, still not talking to oldest daughter, but sees her grandchildren. She is having problems with her sleep, either cannot stay asleep for multiple  nights and at times she is so tired that she sleeps all night and is able to take naps to catch up on sleep. It does affect her ability to focus at work when she is sleep deprived  . She states her mind cannot shut down at night. Discussed love and kindness meditation   Hypothyroidism: she is taking levothyroxine, last TSH was 3.93 , trending up, currently taking two levothyroxine three days a week we will increase to four days weekly and other days stay the same at one pill and recheck next visit   Atherosclerosis of aorta: she prefers not taking rx medication, she is currently taking red yeast  rice   Dysphagia/GERD:seen by Dr. Allegra Lai and had dilation done 08/2018 and is doing well since No dysphagia . She istaking PPI to control symptoms   Weight gain: she has been working from home past 5 years, discussed taking breaks for walking, stop sweet tea and have more balanced meals. She has gained over 20 lbs since 04/2018   Patient Active Problem List   Diagnosis Date Noted  . Esophageal dysphagia   . Adult hypothyroidism 08/15/2017  . Atherosclerosis of aorta (HCC) 09/22/2016  . Hiatal hernia 09/22/2016  . Abnormal liver CT 09/11/2016  . H/O herpes simplex type 2 infection 08/20/2015  . Hematuria 02/19/2015  . B12 deficiency 02/19/2015  . Attention deficit hyperactivity disorder (ADHD), predominantly inattentive type 11/13/2014  . COPD, mild (HCC) 11/13/2014  . Major depression in partial remission (HCC) 11/13/2014  . History of basal cell carcinoma 11/13/2014  . Gastroesophageal reflux disease without esophagitis 11/13/2014  . History of abnormal cervical Pap smear 11/13/2014  . Migraine without aura and without status migrainosus, not intractable 11/13/2014  . Vitamin D deficiency 11/13/2014  . Liver lesion 10/04/2010    Past Surgical History:  Procedure Laterality Date  . ABDOMINAL HYSTERECTOMY  2004   partial  . BREAST SURGERY     biopsies  . ESOPHAGOGASTRODUODENOSCOPY (EGD) WITH PROPOFOL N/A 08/02/2018   Procedure: ESOPHAGOGASTRODUODENOSCOPY (EGD) WITH PROPOFOL;  Surgeon: [name], MD;  Location: [facility];  Service: Gastroenterology;  Laterality: N/A;  . FRACTURE SURGERY     rode in femur  . POLYPECTOMY     vocal chord polyps    Family History  Problem Relation Age of Onset  . Colon cancer Father   . Breast cancer Sister   . Diverticulitis Brother   . Diverticulitis Brother     Social History   Tobacco Use  . Smoking status: Former Smoker    Packs/day: 1.00    Years: 25.00    Pack years: 25.00    Types: Cigarettes    Quit date:  05/25/2004    Years since quitting: 15.9  . Smokeless tobacco: Never Used  Substance Use Topics  . Alcohol use: No    Alcohol/week: 0.0 standard drinks     Current Outpatient Medications:  .  amphetamine-dextroamphetamine (ADDERALL) 10 MG tablet, Take 1 tablet (10 mg total) by mouth 2 (two) times daily., Disp: 60 tablet, Rfl: 0 .  ANORO ELLIPTA 62.5-25 MCG/INH AEPB, Inhale 1 puff into the lungs daily., Disp: 60 each, Rfl: 5 .  cholecalciferol (VITAMIN D) 1000 UNITS tablet, Take 2,000 Units by mouth daily., Disp: , Rfl:  .  COMBIVENT RESPIMAT 20-100 MCG/ACT AERS respimat, INHALE 1 PUFF INTO THE LUNGS EVERY 6 HOURS AS NEEDED FOR WHEEZING OR SHORTNESS OF BREATH, Disp: 12 g, Rfl: 1 .  DULoxetine (CYMBALTA) 60 MG capsule, Take 1 capsule (60  mg total) by mouth daily., Disp: 90 capsule, Rfl: 1 .  hydrOXYzine (ATARAX/VISTARIL) 25 MG tablet, Take 1 tablet (25 mg total) by mouth at bedtime., Disp: 90 tablet, Rfl: 0 .  ibuprofen (ADVIL,MOTRIN) 200 MG tablet, Take by mouth., Disp: , Rfl:  .  levothyroxine (SYNTHROID) 25 MCG tablet, TAKE 1 TABLET BY MOUTH ON TUESDAY, THURSDAY, SATURDAY, SUNDAY AND 2 TABLETS BY MOUTH ON MONDAY, WEDNESDAY AND FRIDAY, Disp: 135 tablet, Rfl: 0 .  omeprazole (PRILOSEC) 40 MG capsule, Take 1 capsule (40 mg total) by mouth daily., Disp: 90 capsule, Rfl: 1 .  SUMAtriptan (IMITREX) 100 MG tablet, Take 1 tablet (100 mg total) by mouth as needed for migraine. May repeat in 2 hours if headache persists or recurs., Disp: 18 tablet, Rfl: 1 .  topiramate (TOPAMAX) 100 MG tablet, Take 1 tablet (100 mg total) by mouth 2 (two) times daily., Disp: 180 tablet, Rfl: 1 .  traMADol (ULTRAM) 50 MG tablet, Take 1 tablet by mouth 2 (two) times daily as needed., Disp: , Rfl:  .  Ubrogepant (UBRELVY) 100 MG TABS, Take 1 tablet by mouth daily as needed., Disp: 10 tablet, Rfl: 2 .  valACYclovir (VALTREX) 500 MG tablet, TAKE 2 TABLETS BY MOUTH TWICE DAILY FOR 10 DAYS, Disp: 20 tablet, Rfl: 0  Allergies   Allergen Reactions  . Codeine   . Pantoprazole     Flu like syptoms    I personally reviewed active problem list, medication list, allergies, family history, social history, health maintenance, notes from last encounter with the patient/caregiver today.   ROS  Constitutional: Negative for fever, positive for  weight change.  Respiratory: Negative for cough and shortness of breath.   Cardiovascular: Negative for chest pain or palpitations.  Gastrointestinal: Negative for abdominal pain, no bowel changes.  Musculoskeletal: Negative for gait problem or joint swelling.  Skin: Negative for rash.  Neurological: Negative for dizziness , positive for intermittent headache.  No other specific complaints in a complete review of systems (except as listed in HPI above).  Objective  Vitals:   04/15/20 0750  BP: 130/84  Pulse: 87  Resp: 16  Temp: 98 F (36.7 C)  TempSrc: Oral  SpO2: 97%  Weight: 176 lb 12.8 oz (80.2 kg)    Body mass index is 31.32 kg/m.  Physical Exam  Constitutional: Patient appears well-developed and well-nourished. Obese  No distress.  HEENT: head atraumatic, normocephalic, pupils equal and reactive to light,  neck supple, no thyromegaly  Cardiovascular: Normal rate, regular rhythm and normal heart sounds.  No murmur heard. No BLE edema. Pulmonary/Chest: Effort normal and breath sounds normal. No respiratory distress. Abdominal: Soft.  There is no tenderness. Psychiatric: Patient has a normal mood and affect. behavior is normal. Judgment and thought content normal.  PHQ2/9: Depression screen [facility] 2/9 04/15/2020 04/15/2020 01/11/2020 10/09/2019 07/12/2019  Decreased Interest 1 1 0 0 1  Down, Depressed, Hopeless 0 0 0 0 2  PHQ - 2 Score 1 1 0 0 3  Altered sleeping 2 - 2 0 3  Tired, decreased energy 3 - 3 3 3   Change in appetite 1 - 2 3 1   Feeling bad or failure about yourself  0 - 0 0 1  Trouble concentrating 1 - 0 3 2  Moving slowly or fidgety/restless 0 - 0  0 0  Suicidal thoughts 0 - 0 0 0  PHQ-9 Score 8 - 7 9 13   Difficult doing work/chores Somewhat difficult - Somewhat difficult Extremely dIfficult Somewhat difficult  Some recent data might be hidden    phq 9 is positive   Fall Risk: Fall Risk  04/15/2020 01/11/2020 10/09/2019 07/12/2019 05/17/2019  Falls in the past year? 0 0 0 0 0  Number falls in past yr: 0 0 0 0 0  Comment - - - - -  Injury with Fall? 0 0 0 0 0  Follow up - - - - Falls evaluation completed     Functional Status Survey: Is the patient deaf or have difficulty hearing?: No Does the patient have difficulty seeing, even when wearing glasses/contacts?: No Does the patient have difficulty concentrating, remembering, or making decisions?: No Does the patient have difficulty walking or climbing stairs?: No Does the patient have difficulty dressing or bathing?: No Does the patient have difficulty doing errands alone such as visiting a doctor's office or shopping?: No   Assessment & Plan  1. COPD, mild (HCC)  Stable   2. Moderate major depression (HCC)  Stable   3. Vitamin D deficiency  Continue supplementation   4. Attention deficit hyperactivity disorder (ADHD), predominantly inattentive type  We will try Vyvanse 30 mg again since she is very concerned about weight gain and not sleeping with Adderal   5. B12 deficiency  Needs to resume medication   6. Hypothyroidism in adult  - levothyroxine (SYNTHROID) 25 MCG tablet; Take 1-2 tablets (25-50 mcg total) by mouth daily before breakfast. Take 50 mcg four days a week and 25 mcg 3 days a week  Dispense: 150 tablet; Refill: 0  7. GERD without esophagitis   8. Migraine without aura and without status migrainosus, not intractable  Continue medication   9. Atherosclerosis of aorta (HCC)  Refuses statin therapy   10. Dyslipidemia   11. S/P balloon dilatation of esophageal stricture

## 2020-05-30 ENCOUNTER — Encounter: Payer: Self-pay | Admitting: Family Medicine

## 2020-05-30 ENCOUNTER — Other Ambulatory Visit: Payer: Self-pay

## 2020-05-30 DIAGNOSIS — F900 Attention-deficit hyperactivity disorder, predominantly inattentive type: Secondary | ICD-10-CM

## 2020-05-30 MED ORDER — LISDEXAMFETAMINE DIMESYLATE 30 MG PO CAPS: 30.0000 mg | ORAL_CAPSULE | Freq: Every day | ORAL | 0 refills | Status: DC

## 2020-07-11 ENCOUNTER — Other Ambulatory Visit: Payer: Self-pay | Admitting: Family Medicine

## 2020-07-11 DIAGNOSIS — F321 Major depressive disorder, single episode, moderate: Secondary | ICD-10-CM

## 2020-07-29 NOTE — Progress Notes (Signed)
Name: Jane Patrick   MRN: [id number]    DOB: [birth year]   Date:07/30/2020       Progress Note  Subjective  Chief Complaint  Follow up   HPI    Chronic back pain: : she is seeing [name] and had MRI done 03/30/2019, and showed L2- 3 Disc bulge , and also a hypointense bone lesion, repeat Feb 2021 showed stability. She continues to have daily pain, she has noticed worsening of pain lately, now having pain on hips she will contact him back   Migraine headache: she is on Topamax for prevention, usually triggered by barometric pressure changes. Ruel Favors first now because Imitrex makes her feel heavy and tired. Bernita Raisin works within 30 minutes and no side effects  Abnormal pap smear in 09-30-10: she was referred to [facility], and advised to see [name].  She had a TAH in 09-30-2002 for treatment of abnormal cervical cells. She was seen by [name] at [facility] and last pap done in Feb 2017 was normal with positive HPV , October 2017, she missed appointment 02/2017 , she lost her job and has to re-schedule that she has insurance.No vaginal bleeding or cramping. She had repeat pap smear 05/2019 by gyn and was normal, reminded her to go back for follow up  Herpes Type II: she had a small lesion when she was seen by [name] and culture was positive for herpes, she was given Valtrex, no other outbreaks since. Unchanged   Hematuria: seen at [facility] in 2010/09/30, had multiple CT, last one in 2010-09-30 and stable liver lesions no further evaluation, patient states also told the same by Urologist for negative hematuria evaluation.Does not have follow up, she states she has been released from their care   ADD she has been taking Vyvanse since Fall 2021 she states it helps her stay alert all day, no longer crashing at the end of the day. No side effects, we will monitor bp   COPD Mild:Last flare was Dec 2019 took Zpack and tessalon perles.. No longer smoking - quit about 12years ago. She was using  Combivent more often end of 09/30/18, she is now back on Anoro and is doing well, uses rescue inhaler less often now.   B12 deficiency: found in June 2016,currently only using SL B12 daily, we will recheck labs today   Major Depression in partial Remission: on Pristiq andoff AlprazolamSymptoms started when husband was diagnosed with lung cancer and died in 09-29-08.CurrentlyDuloxetine, no side effects,Phq 9 is getting higher, she is very stressed because she has a support dog and they are trying to evict her if she does not give a supportive letter to be able to stay there. She works from home, her dog is now 8 years, she has had her dog since she moved there. She is very stressed about the possibility to lose her dog, she keeps her calm. She will need to leave her rental home by the end of the month if letter not written, her dog is registered with the support animal registry since 09-30-14   Hypothyroidism: she is taking levothyroxine,no hair loss, no change in bowel movements She continues to feel tired, but last TSH was normal   CKI stage III denies pruritis or decrease in urine output, bp towards high end of normal, we will continue to monitor and consider adding ace. Discussed importance of avoiding NSAID's   Breast lump: she noticed a small lump on left breast and is due for mammogram,  she states she could not feel it this morning, it was about a pea size   Atherosclerosis of aorta: she prefers not taking rx medication, she will try red rice yeast at home.Reviewed last labs Last LDL was 150  Dysphagia/GERD:seen by Dr. Allegra LaiVanga and had dilation done 08/2018 and is doing well since No dysphagia . She istaking PPI and symptoms are controlled Unchanged   Patient Active Problem List   Diagnosis Date Noted  . Esophageal dysphagia   . Adult hypothyroidism 08/15/2017  . Atherosclerosis of aorta (HCC) 09/22/2016  . Hiatal hernia 09/22/2016  . Abnormal liver CT 09/11/2016  . H/O herpes simplex  type 2 infection 08/20/2015  . Hematuria 02/19/2015  . B12 deficiency 02/19/2015  . Attention deficit hyperactivity disorder (ADHD), predominantly inattentive type 11/13/2014  . COPD, mild (HCC) 11/13/2014  . Major depression in partial remission (HCC) 11/13/2014  . History of basal cell carcinoma 11/13/2014  . Gastroesophageal reflux disease without esophagitis 11/13/2014  . History of abnormal cervical Pap smear 11/13/2014  . Migraine without aura and without status migrainosus, not intractable 11/13/2014  . Vitamin D deficiency 11/13/2014  . Liver lesion 10/04/2010    Past Surgical History:  Procedure Laterality Date  . ABDOMINAL HYSTERECTOMY  2004   partial  . BREAST SURGERY     biopsies  . ESOPHAGOGASTRODUODENOSCOPY (EGD) WITH PROPOFOL N/A 08/02/2018   Procedure: ESOPHAGOGASTRODUODENOSCOPY (EGD) WITH PROPOFOL;  Surgeon: Toney ReilVanga, Rohini Reddy, MD;  [facility];  Service: Gastroenterology;  Laterality: N/A;  . FRACTURE SURGERY     rode in femur  . POLYPECTOMY     vocal chord polyps    Family History  Problem Relation Age of Onset  . Colon cancer Father   . Breast cancer Sister   . Diverticulitis Brother   . Diverticulitis Brother     Social History   Tobacco Use  . Smoking status: Former Smoker    Packs/day: 1.00    Years: 25.00    Pack years: 25.00    Types: Cigarettes    Quit date: 05/25/2004    Years since quitting: 16.1  . Smokeless tobacco: Never Used  Substance Use Topics  . Alcohol use: No    Alcohol/week: 0.0 standard drinks     Current Outpatient Medications:  .  ANORO ELLIPTA 62.5-25 MCG/INH AEPB, Inhale 1 puff into the lungs daily., Disp: 60 each, Rfl: 5 .  cholecalciferol (VITAMIN D) 1000 UNITS tablet, Take 2,000 Units by mouth daily., Disp: , Rfl:  .  COMBIVENT RESPIMAT 20-100 MCG/ACT AERS respimat, INHALE 1 PUFF INTO THE LUNGS EVERY 6 HOURS AS NEEDED FOR WHEEZING OR SHORTNESS OF BREATH, Disp: 12 g, Rfl: 1 .  DULoxetine (CYMBALTA) 60 MG  capsule, TAKE 1 CAPSULE(60 MG) BY MOUTH DAILY, Disp: 90 capsule, Rfl: 0 .  hydrOXYzine (ATARAX/VISTARIL) 25 MG tablet, Take 1 tablet (25 mg total) by mouth at bedtime., Disp: 90 tablet, Rfl: 0 .  ibuprofen (ADVIL,MOTRIN) 200 MG tablet, Take by mouth., Disp: , Rfl:  .  levothyroxine (SYNTHROID) 25 MCG tablet, Take 1-2 tablets (25-50 mcg total) by mouth daily before breakfast. Take 50 mcg four days a week and 25 mcg 3 days a week, Disp: 150 tablet, Rfl: 0 .  lisdexamfetamine (VYVANSE) 30 MG capsule, Take 1 capsule (30 mg total) by mouth daily., Disp: 30 capsule, Rfl: 0 .  omeprazole (PRILOSEC) 40 MG capsule, Take 1 capsule (40 mg total) by mouth daily., Disp: 90 capsule, Rfl: 1 .  SUMAtriptan (IMITREX) 100 MG tablet, Take  1 tablet (100 mg total) by mouth as needed for migraine. May repeat in 2 hours if headache persists or recurs., Disp: 18 tablet, Rfl: 1 .  temazepam (RESTORIL) 15 MG capsule, Take 1 capsule (15 mg total) by mouth at bedtime as needed for sleep., Disp: 30 capsule, Rfl: 0 .  topiramate (TOPAMAX) 100 MG tablet, Take 1 tablet (100 mg total) by mouth 2 (two) times daily., Disp: 180 tablet, Rfl: 1 .  traMADol (ULTRAM) 50 MG tablet, Take 1 tablet by mouth 2 (two) times daily as needed., Disp: , Rfl:  .  Ubrogepant (UBRELVY) 100 MG TABS, Take 1 tablet by mouth daily as needed., Disp: 10 tablet, Rfl: 2 .  valACYclovir (VALTREX) 500 MG tablet, TAKE 2 TABLETS BY MOUTH TWICE DAILY FOR 10 DAYS, Disp: 20 tablet, Rfl: 0  Allergies  Allergen Reactions  . Codeine   . Pantoprazole     Flu like syptoms    I personally reviewed active problem list, medication list, allergies, family history, social history, health maintenance with the patient/caregiver today.   ROS  Constitutional: Negative for fever or weight change.  Respiratory: Negative for cough and shortness of breath.   Cardiovascular: Negative for chest pain or palpitations.  Gastrointestinal: Negative for abdominal pain, no bowel  changes.  Musculoskeletal: Negative for gait problem or joint swelling.  Skin: Negative for rash.  Neurological: Negative for dizziness , positive for intermittent headache.  No other specific complaints in a complete review of systems (except as listed in HPI above).  Objective  Vitals:   07/30/20 0744  BP: 138/82  Pulse: 92  Resp: 16  Temp: 97.6 F (36.4 C)  TempSrc: Oral  SpO2: 96%  Weight: 175 lb (79.4 kg)  Height: 5\' 3"  (1.6 m)    Body mass index is 31 kg/m.  Physical Exam  Constitutional: Patient appears well-developed and well-nourished. Obese  No distress.  HEENT: head atraumatic, normocephalic, pupils equal and reactive to light, neck supple Cardiovascular: Normal rate, regular rhythm and normal heart sounds.  No murmur heard. No BLE edema. Pulmonary/Chest: Effort normal and breath sounds normal. No respiratory distress. Abdominal: Soft.  There is no tenderness. Breast: no masses or lumps, discussed still getting diagnostic but she feels comfortable starting with screening test since no masses or lumps found, old scar from biopsy on both breasts  Psychiatric: Patient has a normal mood and affect. behavior is normal. Judgment and thought content normal.   PHQ2/9: Depression screen [facility] 2/9 07/30/2020 04/15/2020 04/15/2020 01/11/2020 10/09/2019  Decreased Interest 2 1 1  0 0  Down, Depressed, Hopeless 2 0 0 0 0  PHQ - 2 Score 4 1 1  0 0  Altered sleeping 3 2 - 2 0  Tired, decreased energy 3 3 - 3 3  Change in appetite 0 1 - 2 3  Feeling bad or failure about yourself  0 0 - 0 0  Trouble concentrating 3 1 - 0 3  Moving slowly or fidgety/restless 0 0 - 0 0  Suicidal thoughts 0 0 - 0 0  PHQ-9 Score 13 8 - 7 9  Difficult doing work/chores - Somewhat difficult - Somewhat difficult Extremely dIfficult  Some recent data might be hidden    phq 9 is positive   Fall Risk: Fall Risk  07/30/2020 04/15/2020 01/11/2020 10/09/2019 07/12/2019  Falls in the past year? 0 0 0 0 0   Number falls in past yr: 0 0 0 0 0  Comment - - - - -  Injury  with Fall? 0 0 0 0 0  Follow up - - - - -     Assessment & Plan  1. Stage 3a chronic kidney disease (HCC)   2. Attention deficit hyperactivity disorder (ADHD), predominantly inattentive type  - lisdexamfetamine (VYVANSE) 30 MG capsule; Take 1 capsule (30 mg total) by mouth daily.  Dispense: 90 capsule; Refill: 0  3. Breast cancer screening by mammogram  - MM 3D SCREEN BREAST BILATERAL; Future  4. COPD, mild (HCC)  - ANORO ELLIPTA 62.5-25 MCG/INH AEPB; Inhale 1 puff into the lungs daily.  Dispense: 60 each; Refill: 5 - Ipratropium-Albuterol (COMBIVENT RESPIMAT) 20-100 MCG/ACT AERS respimat; Inhale 1 puff into the lungs every 6 (six) hours as needed for wheezing.  Dispense: 12 g; Refill: 1  5. Moderate major depression (HCC)  - DULoxetine (CYMBALTA) 60 MG capsule; Take 1 capsule (60 mg total) by mouth daily.  Dispense: 90 capsule; Refill: 1  6. Vitamin D deficiency   7. Hypothyroidism in adult  - levothyroxine (SYNTHROID) 25 MCG tablet; Take 1-2 tablets (25-50 mcg total) by mouth daily before breakfast. Take 50 mcg four days a week and 25 mcg 3 days a week  Dispense: 150 tablet; Refill: 0  8. B12 deficiency   9. Migraine without aura and without status migrainosus, not intractable  - SUMAtriptan (IMITREX) 100 MG tablet; Take 1 tablet (100 mg total) by mouth as needed for migraine. May repeat in 2 hours if headache persists or recurs.  Dispense: 18 tablet; Refill: 1 - topiramate (TOPAMAX) 100 MG tablet; Take 1 tablet (100 mg total) by mouth 2 (two) times daily.  Dispense: 180 tablet; Refill: 1 - Ubrogepant (UBRELVY) 100 MG TABS; Take 1 tablet by mouth daily as needed.  Dispense: 10 tablet; Refill: 2  10. Atherosclerosis of aorta (HCC)  Refused statin therapy   11. GERD without esophagitis  - omeprazole (PRILOSEC) 40 MG capsule; Take 1 capsule (40 mg total) by mouth daily.  Dispense: 90 capsule; Refill:  1  12. Dyslipidemia   13. S/P balloon dilatation of esophageal stricture  - omeprazole (PRILOSEC) 40 MG capsule; Take 1 capsule (40 mg total) by mouth daily.  Dispense: 90 capsule; Refill: 1  14. Insomnia due to other mental disorder  - temazepam (RESTORIL) 15 MG capsule; Take 1 capsule (15 mg total) by mouth at bedtime as needed for sleep.  Dispense: 90 capsule; Refill: 0

## 2020-07-30 ENCOUNTER — Other Ambulatory Visit: Payer: Self-pay

## 2020-07-30 ENCOUNTER — Ambulatory Visit: Payer: 59 | Admitting: Family Medicine

## 2020-07-30 ENCOUNTER — Encounter: Payer: Self-pay | Admitting: Family Medicine

## 2020-07-30 VITALS — BP 138/82 | HR 92 | Temp 97.6°F | Resp 16 | Ht 63.0 in | Wt 175.0 lb

## 2020-07-30 DIAGNOSIS — Z9889 Other specified postprocedural states: Secondary | ICD-10-CM

## 2020-07-30 DIAGNOSIS — F5105 Insomnia due to other mental disorder: Secondary | ICD-10-CM

## 2020-07-30 DIAGNOSIS — E559 Vitamin D deficiency, unspecified: Secondary | ICD-10-CM

## 2020-07-30 DIAGNOSIS — Z1231 Encounter for screening mammogram for malignant neoplasm of breast: Secondary | ICD-10-CM | POA: Diagnosis not present

## 2020-07-30 DIAGNOSIS — E538 Deficiency of other specified B group vitamins: Secondary | ICD-10-CM

## 2020-07-30 DIAGNOSIS — N1831 Chronic kidney disease, stage 3a: Secondary | ICD-10-CM | POA: Diagnosis not present

## 2020-07-30 DIAGNOSIS — K219 Gastro-esophageal reflux disease without esophagitis: Secondary | ICD-10-CM

## 2020-07-30 DIAGNOSIS — F99 Mental disorder, not otherwise specified: Secondary | ICD-10-CM

## 2020-07-30 DIAGNOSIS — J449 Chronic obstructive pulmonary disease, unspecified: Secondary | ICD-10-CM | POA: Diagnosis not present

## 2020-07-30 DIAGNOSIS — E039 Hypothyroidism, unspecified: Secondary | ICD-10-CM

## 2020-07-30 DIAGNOSIS — E785 Hyperlipidemia, unspecified: Secondary | ICD-10-CM

## 2020-07-30 DIAGNOSIS — F321 Major depressive disorder, single episode, moderate: Secondary | ICD-10-CM

## 2020-07-30 DIAGNOSIS — F900 Attention-deficit hyperactivity disorder, predominantly inattentive type: Secondary | ICD-10-CM | POA: Diagnosis not present

## 2020-07-30 DIAGNOSIS — I700 Atherosclerosis of aorta: Secondary | ICD-10-CM

## 2020-07-30 DIAGNOSIS — G43009 Migraine without aura, not intractable, without status migrainosus: Secondary | ICD-10-CM

## 2020-07-30 MED ORDER — SUMATRIPTAN SUCCINATE 100 MG PO TABS: 100.0000 mg | ORAL_TABLET | ORAL | 1 refills | Status: DC | PRN

## 2020-07-30 MED ORDER — LEVOTHYROXINE SODIUM 25 MCG PO TABS: 25.0000 ug | ORAL_TABLET | Freq: Every day | ORAL | 0 refills | Status: DC

## 2020-07-30 MED ORDER — COMBIVENT RESPIMAT 20-100 MCG/ACT IN AERS: 1.0000 | INHALATION_SPRAY | Freq: Four times a day (QID) | RESPIRATORY_TRACT | 1 refills | Status: DC | PRN

## 2020-07-30 MED ORDER — ANORO ELLIPTA 62.5-25 MCG/INH IN AEPB: 1.0000 | INHALATION_SPRAY | Freq: Every day | RESPIRATORY_TRACT | 5 refills | Status: DC

## 2020-07-30 MED ORDER — OMEPRAZOLE 40 MG PO CPDR: 40.0000 mg | DELAYED_RELEASE_CAPSULE | Freq: Every day | ORAL | 1 refills | Status: DC

## 2020-07-30 MED ORDER — LISDEXAMFETAMINE DIMESYLATE 30 MG PO CAPS
30.0000 mg | ORAL_CAPSULE | Freq: Every day | ORAL | 0 refills | Status: DC
Start: 2020-07-30 — End: 2020-11-12

## 2020-07-30 MED ORDER — TEMAZEPAM 15 MG PO CAPS
15.0000 mg | ORAL_CAPSULE | Freq: Every evening | ORAL | 0 refills | Status: DC | PRN
Start: 2020-07-30 — End: 2020-11-12

## 2020-07-30 MED ORDER — DULOXETINE HCL 60 MG PO CPEP: 60.0000 mg | ORAL_CAPSULE | Freq: Every day | ORAL | 1 refills | Status: DC

## 2020-07-30 MED ORDER — UBRELVY 100 MG PO TABS: 1.0000 | ORAL_TABLET | Freq: Every day | ORAL | 2 refills | Status: DC | PRN

## 2020-07-30 MED ORDER — TOPIRAMATE 100 MG PO TABS: 100.0000 mg | ORAL_TABLET | Freq: Two times a day (BID) | ORAL | 1 refills | Status: DC

## 2020-07-31 ENCOUNTER — Other Ambulatory Visit: Payer: Self-pay

## 2020-07-31 DIAGNOSIS — F99 Mental disorder, not otherwise specified: Secondary | ICD-10-CM

## 2020-07-31 DIAGNOSIS — F5105 Insomnia due to other mental disorder: Secondary | ICD-10-CM

## 2020-11-11 NOTE — Progress Notes (Signed)
Name: Jane Patrick   MRN: [id number]    DOB: August 27, 1960   Date:11/12/2020       Progress Note  Subjective  Chief Complaint  Follow up   HPI  Chronic back pain: : she is seeing [name] and had MRI done 03/30/2019, and showed L2- 3  Disc bulge , and also a hypointense bone lesion, repeat Feb 2021 showed stability . She continues to have daily pain, she saw him recently and is back on Tramadol   Migraine headache: she is on Topamax for prevention,  usually triggered by barometric pressure changes. Ruel Favors first now because Imitrex makes her feel heavy and tired. Bernita Raisin works within 30 minutes and no side effects. She usually has less than one episode of migraine per week but had more last week.    Herpes Type II: she had a small lesion when she was seen by [name] and culture was positive for herpes, she was given Valtrex, no other outbreaks since. Unchanged , only one outbreak no recurrence    Hematuria: seen at [facility] in 09-15-10 , had multiple CT, last one in 09/15/2010 and stable liver lesions no further evaluation, patient states also told the same by Urologist for negative hematuria evaluation .Does not have follow up, she states she has been released from their care . Unchanged    ADD she has been taking Vyvanse since Fall 2021 she states it helps her stay alert all day, no longer crashing at the end of the day, she was on Adderal previously  No side effects, bp is at goal .     COPD Mild:Last flare was Dec 2019 took Zpack and tessalon perles. . No longer smoking - quit about 12 years ago.  She was using Combivent more often end of 09-15-2018, she is now back on Anoro and is doing well,, only uses Combivent prn    B12 deficiency: found in June 2016, currently only using SL B12 daily , last level was in the 400's    Major Depression in partial Remission: on Pristiq and off AlprazolamSymptoms started when husband was diagnosed with lung cancer and died in 09/14/08. Currently  Duloxetine, no side  effects, Phq 9 is better, moved in with her boyfriend , she is aware her oldest daughter is not going to change and she is okay with that now.    Hypothyroidism: she is taking levothyroxine, no hair loss, no change in bowel movements She continues to feel tired, but last TSH was normal . She wants a refill of medication   CKI stage III denies pruritis or decrease in urine output, bp towards high end of normal, we will continue to monitor and consider adding ace. Discussed importance of avoiding NSAID's again. She does not want to add another medication now.   Breast lump: she noticed a small lump on left breast and is due for mammogram, she states she could not feel it this morning, it was about a pea size    Atherosclerosis of aorta: she prefers not taking rx medication, she will try red rice yeast at home.  Reviewed last labs Last LDL was 150. Explained that if she has plaques on her aorta it is likely she has plaque in other areas   Dysphagia/GERD: seen by Dr. Allegra Lai and had dilation done 2018-09-15 and is doing well since No dysphagia . She is taking PPI and symptoms are controlled  She is still doing well   Patient Active Problem List  Diagnosis Date Noted   Esophageal dysphagia    Adult hypothyroidism 08/15/2017   Atherosclerosis of aorta (HCC) 09/22/2016   Hiatal hernia 09/22/2016   Abnormal liver CT 09/11/2016   H/O herpes simplex type 2 infection 08/20/2015   Hematuria 02/19/2015   B12 deficiency 02/19/2015   Attention deficit hyperactivity disorder (ADHD), predominantly inattentive type 11/13/2014   COPD, mild (HCC) 11/13/2014   Major depression in partial remission (HCC) 11/13/2014   History of basal cell carcinoma 11/13/2014   Gastroesophageal reflux disease without esophagitis 11/13/2014   History of abnormal cervical Pap smear 11/13/2014   Migraine without aura and without status migrainosus, not intractable 11/13/2014   Vitamin D deficiency 11/13/2014   Liver lesion  10/04/2010    Past Surgical History:  Procedure Laterality Date   ABDOMINAL HYSTERECTOMY  2004   partial   BREAST SURGERY     biopsies   ESOPHAGOGASTRODUODENOSCOPY (EGD) WITH PROPOFOL N/A 08/02/2018   Procedure: ESOPHAGOGASTRODUODENOSCOPY (EGD) WITH PROPOFOL;  Surgeon: Toney ReilVanga, Rohini Reddy, MD;  Location: [facility] ENDOSCOPY;  Service: Gastroenterology;  Laterality: N/A;   FRACTURE SURGERY     rode in femur   POLYPECTOMY     vocal chord polyps    Family History  Problem Relation Age of Onset   Colon cancer Father    Breast cancer Sister    Diverticulitis Brother    Diverticulitis Brother     Social History   Tobacco Use   Smoking status: Former    Packs/day: 1.00    Years: 25.00    Pack years: 25.00    Types: Cigarettes    Quit date: 05/25/2004    Years since quitting: 16.4   Smokeless tobacco: Never  Substance Use Topics   Alcohol use: No    Alcohol/week: 0.0 standard drinks     Current Outpatient Medications:    ANORO ELLIPTA 62.5-25 MCG/INH AEPB, Inhale 1 puff into the lungs daily., Disp: 60 each, Rfl: 5   cholecalciferol (VITAMIN D) 1000 UNITS tablet, Take 2,000 Units by mouth daily., Disp: , Rfl:    DULoxetine (CYMBALTA) 60 MG capsule, Take 1 capsule (60 mg total) by mouth daily., Disp: 90 capsule, Rfl: 1   Ipratropium-Albuterol (COMBIVENT RESPIMAT) 20-100 MCG/ACT AERS respimat, Inhale 1 puff into the lungs every 6 (six) hours as needed for wheezing., Disp: 12 g, Rfl: 1   levothyroxine (SYNTHROID) 25 MCG tablet, Take 1-2 tablets (25-50 mcg total) by mouth daily before breakfast. Take 50 mcg four days a week and 25 mcg 3 days a week, Disp: 150 tablet, Rfl: 0   lisdexamfetamine (VYVANSE) 30 MG capsule, Take 1 capsule (30 mg total) by mouth daily., Disp: 90 capsule, Rfl: 0   omeprazole (PRILOSEC) 40 MG capsule, Take 1 capsule (40 mg total) by mouth daily., Disp: 90 capsule, Rfl: 1   SUMAtriptan (IMITREX) 100 MG tablet, Take 1 tablet (100 mg total) by mouth as needed for  migraine. May repeat in 2 hours if headache persists or recurs., Disp: 18 tablet, Rfl: 1   topiramate (TOPAMAX) 100 MG tablet, Take 1 tablet (100 mg total) by mouth 2 (two) times daily., Disp: 180 tablet, Rfl: 1   traMADol (ULTRAM) 50 MG tablet, Take 1 tablet by mouth 2 (two) times daily as needed., Disp: , Rfl:    Ubrogepant (UBRELVY) 100 MG TABS, Take 1 tablet by mouth daily as needed., Disp: 10 tablet, Rfl: 2   valACYclovir (VALTREX) 500 MG tablet, TAKE 2 TABLETS BY MOUTH TWICE DAILY FOR 10 DAYS, Disp: 20 tablet,  Rfl: 0  Allergies  Allergen Reactions   Codeine    Pantoprazole     Flu like syptoms    I personally reviewed active problem list, medication list, allergies, family history, social history, health maintenance, notes from last encounter with the patient/caregiver today.   ROS  Constitutional: Negative for fever or weight change.  Respiratory: Negative for cough and shortness of breath.   Cardiovascular: Negative for chest pain or palpitations.  Gastrointestinal: Negative for abdominal pain, no bowel changes.  Musculoskeletal: Negative for gait problem or joint swelling.  Skin: Negative for rash.  Neurological: Negative for dizziness or headache.  No other specific complaints in a complete review of systems (except as listed in HPI above).   Objective  Vitals:   11/12/20 1349  BP: 136/82  Pulse: 87  Resp: 16  Temp: 97.9 F (36.6 C)  TempSrc: Oral  SpO2: 97%  Weight: 171 lb (77.6 kg)  Height: 5\' 3"  (1.6 m)    Body mass index is 30.29 kg/m.  Physical Exam  Constitutional: Patient appears well-developed and well-nourished. Obese  No distress.  HEENT: head atraumatic, normocephalic, pupils equal and reactive to light, neck supple Cardiovascular: Normal rate, regular rhythm and normal heart sounds.  No murmur heard. No BLE edema. Pulmonary/Chest: Effort normal and breath sounds normal. No respiratory distress. Abdominal: Soft.  There is no  tenderness. Psychiatric: Patient has a normal mood and affect. behavior is normal. Judgment and thought content normal.   PHQ2/9: Depression screen [facility] 2/9 11/12/2020 07/30/2020 04/15/2020 04/15/2020 01/11/2020  Decreased Interest 1 2 1 1  0  Down, Depressed, Hopeless 1 2 0 0 0  PHQ - 2 Score 2 4 1 1  0  Altered sleeping 3 3 2  - 2  Tired, decreased energy 3 3 3  - 3  Change in appetite 0 0 1 - 2  Feeling bad or failure about yourself  0 0 0 - 0  Trouble concentrating 0 3 1 - 0  Moving slowly or fidgety/restless 0 0 0 - 0  Suicidal thoughts 0 0 0 - 0  PHQ-9 Score 8 13 8  - 7  Difficult doing work/chores - - Somewhat difficult - Somewhat difficult  Some recent data might be hidden    phq 9 is positive  Fall Risk: Fall Risk  11/12/2020 07/30/2020 04/15/2020 01/11/2020 10/09/2019  Falls in the past year? 0 0 0 0 0  Number falls in past yr: 0 0 0 0 0  Comment - - - - -  Injury with Fall? 0 0 0 0 0  Follow up - - - - -     Functional Status Survey: Is the patient deaf or have difficulty hearing?: No Does the patient have difficulty seeing, even when wearing glasses/contacts?: No Does the patient have difficulty concentrating, remembering, or making decisions?: No Does the patient have difficulty walking or climbing stairs?: No Does the patient have difficulty dressing or bathing?: No Does the patient have difficulty doing errands alone such as visiting a doctor's office or shopping?: No    Assessment & Plan  1. Stage 3a chronic kidney disease (HCC)   2. Attention deficit hyperactivity disorder (ADHD), predominantly inattentive type-  lisdexamfetamine (VYVANSE) 30 MG capsule; Take 1 capsule (30 mg total) by mouth daily.  Dispense: 90 capsule; Refill: 0  3. B12 deficiency   4. Moderate major depression (HCC)  Taking medication doing a little better   5. COPD, mild (HCC)   6. Hypothyroidism in adult  - levothyroxine (SYNTHROID) 25  MCG tablet; Take 1-2 tablets (25-50 mcg total)  by mouth daily before breakfast. Take 50 mcg four days a week and 25 mcg 3 days a week  Dispense: 150 tablet; Refill: 0  7. Vitamin D deficiency  Continue supplementation   8. Migraine without aura and without status migrainosus, not intractable  Continue medications  9. GERD without esophagitis   10. Dyslipidemia   11. Atherosclerosis of aorta (HCC)  Refuses statin therapy   12. Insomnia due to other mental disorder

## 2020-11-12 ENCOUNTER — Ambulatory Visit: Payer: 59 | Admitting: Family Medicine

## 2020-11-12 ENCOUNTER — Encounter: Payer: Self-pay | Admitting: Family Medicine

## 2020-11-12 ENCOUNTER — Other Ambulatory Visit: Payer: Self-pay

## 2020-11-12 VITALS — BP 136/82 | HR 87 | Temp 97.9°F | Resp 16 | Ht 63.0 in | Wt 171.0 lb

## 2020-11-12 DIAGNOSIS — I700 Atherosclerosis of aorta: Secondary | ICD-10-CM

## 2020-11-12 DIAGNOSIS — E538 Deficiency of other specified B group vitamins: Secondary | ICD-10-CM | POA: Diagnosis not present

## 2020-11-12 DIAGNOSIS — K219 Gastro-esophageal reflux disease without esophagitis: Secondary | ICD-10-CM

## 2020-11-12 DIAGNOSIS — E039 Hypothyroidism, unspecified: Secondary | ICD-10-CM

## 2020-11-12 DIAGNOSIS — F900 Attention-deficit hyperactivity disorder, predominantly inattentive type: Secondary | ICD-10-CM | POA: Diagnosis not present

## 2020-11-12 DIAGNOSIS — F5105 Insomnia due to other mental disorder: Secondary | ICD-10-CM

## 2020-11-12 DIAGNOSIS — N1831 Chronic kidney disease, stage 3a: Secondary | ICD-10-CM

## 2020-11-12 DIAGNOSIS — E785 Hyperlipidemia, unspecified: Secondary | ICD-10-CM

## 2020-11-12 DIAGNOSIS — F321 Major depressive disorder, single episode, moderate: Secondary | ICD-10-CM | POA: Diagnosis not present

## 2020-11-12 DIAGNOSIS — F99 Mental disorder, not otherwise specified: Secondary | ICD-10-CM

## 2020-11-12 DIAGNOSIS — G43009 Migraine without aura, not intractable, without status migrainosus: Secondary | ICD-10-CM

## 2020-11-12 DIAGNOSIS — J449 Chronic obstructive pulmonary disease, unspecified: Secondary | ICD-10-CM

## 2020-11-12 DIAGNOSIS — E559 Vitamin D deficiency, unspecified: Secondary | ICD-10-CM

## 2020-11-12 MED ORDER — LISDEXAMFETAMINE DIMESYLATE 30 MG PO CAPS: 30.0000 mg | ORAL_CAPSULE | Freq: Every day | ORAL | 0 refills | Status: DC

## 2020-11-12 MED ORDER — LEVOTHYROXINE SODIUM 25 MCG PO TABS: 25.0000 ug | ORAL_TABLET | Freq: Every day | ORAL | 0 refills | Status: DC

## 2020-11-12 NOTE — Patient Instructions (Signed)
Semaglutide Injection (Weight Management) What is this medication? SEMAGLUTIDE (Sem a GLOO tide) is used to help people lose weight and maintainweight loss. It is used with a reduced-calorie diet and exercise. This medicine may be used for other purposes; ask your health care provider orpharmacist if you have questions. COMMON BRAND NAME(S): Wegovy What should I tell my care team before I take this medication? They need to know if you have any of these conditions: endocrine tumors (MEN 2) or if someone in your family had these tumors eye disease, vision problems gallbladder disease history of depression or mental health disease history of pancreatitis kidney disease stomach or intestine problems suicidal thoughts, plans, or attempt; a previous suicide attempt by you or a family member thyroid cancer or if someone in your family had thyroid cancer an unusual or allergic reaction to semaglutide, other medicines, foods, dyes, or preservatives pregnant or trying to get pregnant breast-feeding How should I use this medication? This medicine is injected under the skin. You will be taught how to prepare and give it. Take it as directed on the prescription label. It is given once every week (every 7 days). Keep taking it unless your health care provider tells youto stop. It is important that you put your used needles and pens in a special sharps container. Do not put them in a trash can. If you do not have a sharpscontainer, call your pharmacist or health care provider to get one. A special MedGuide will be given to you by the pharmacist with eachprescription and refill. Be sure to read this information carefully each time. This medicine comes with INSTRUCTIONS FOR USE. Ask your pharmacist for directions on how to use this medicine. Read the information carefully. Talk toyour pharmacist or health care provider if you have questions. Talk to your health care provider about the use of this medicine in  children.Special care may be needed. Overdosage: If you think you have taken too much of this medicine contact apoison control center or emergency room at once. NOTE: This medicine is only for you. Do not share this medicine with others. What if I miss a dose? If you miss a dose and the next scheduled dose is more than 2 days away, take the missed dose as soon as possible. If you miss a dose and the next scheduled dose is less than 2 days away, do not take the missed dose. Take the next dose at your regular time. Do not take double or extra doses. If you miss your dose for 2 weeks or more, take the next dose at your regular time or call yourhealth care provider to talk about how to restart this medicine. What may interact with this medication? insulin and other medicines for diabetes This list may not describe all possible interactions. Give your health care provider a list of all the medicines, herbs, non-prescription drugs, or dietary supplements you use. Also tell them if you smoke, drink alcohol, or use illegaldrugs. Some items may interact with your medicine. What should I watch for while using this medication? Visit your health care provider for regular checks on your progress. It may besome time before you see the benefit from this medicine. Drink plenty of fluids while taking this medicine. Check with your health care provider if you have severe diarrhea, nausea, and vomiting, or if you sweat a lot. The loss of too much body fluid may make it dangerous for you to take thismedicine. This medicine may affect blood sugar levels. Ask   your health care provider ifchanges in diet or medicines are needed if you have diabetes. If you or your family notice any changes in your behavior, such as new or worsening depression, thoughts of harming yourself, anxiety, other unusual ordisturbing thoughts, or memory loss, call your health care provider right away. Women should inform their health care provider if  they wish to become pregnant or think they might be pregnant. Losing weight while pregnant is not advised and may cause harm to the unborn child. Talk to your health care provider formore information. What side effects may I notice from receiving this medication? Side effects that you should report to your doctor or health care professionalas soon as possible: allergic reactions like skin rash, itching or hives, swelling of the face, lips, or tongue changes in vision fast heartbeat gallbladder problems (fever, upper belly pain, yellowing of the eyes or skin, clay-colored stool) kidney injury (trouble passing urine or change in the amount of urine) low blood sugar (feeling anxious; confusion; dizziness; increased hunger; unusually weak or tired; increased sweating; shakiness; cold, clammy skin; irritable; headache; blurred vision; loss of consciousness) lump or swelling on the neck pancreatitis (stomach pain that spreads to your back or gets worse after eating or when touched, fever, nausea, vomiting) suicidal thoughts, mood changes trouble breathing trouble swallowing Side effects that usually do not require medical attention (report these toyour doctor or health care professional if they continue or are bothersome): constipation diarrhea headache heartburn (burning feeling in chest, often after eating or when lying down) nausea pain, redness, or irritation at site where injected passing gas upset stomach vomiting This list may not describe all possible side effects. Call your doctor for medical advice about side effects. You may report side effects to FDA at1-800-FDA-1088. Where should I keep my medication? Keep out of the reach of children and pets. Refrigeration (preferred): Store in the refrigerator. Do not freeze. Keep this medicine in the original container until you are ready to take it. Get rid ofany unused medicine after the expiration date. Room temperature: If needed, prior to  cap removal, the pen can be stored at room temperature for up to 28 days. Protect from light. If it is stored at room temperature, get rid of any unused medicine after 28 days or after it expires,whichever is first. It is important to get rid of the medicine as soon as you no longer need it orit is expired. You can do this in two ways: Take the medicine to a medicine take-back program. Check with your pharmacy or law enforcement to find a location. If you cannot return the medicine, follow the directions in the MedGuide. NOTE: This sheet is a summary. It may not cover all possible information. If you have questions about this medicine, talk to your doctor, pharmacist, orhealth care provider.  2022 [employer]vier/Gold Standard (2019-10-31 11:38:13)  

## 2021-02-03 NOTE — Progress Notes (Signed)
Name: Jane Patrick   MRN: [id number]    DOB: Dec 19, 1960   Date:02/04/2021       Progress Note  Subjective  Chief Complaint  Follow Up  HPI  Chronic back pain: : she is seeing [name] and had MRI done 03/30/2019, and showed L2- 3  Disc bulge , and also a hypointense bone lesion, repeat Feb 2021 showed stability . She continues to have daily pain described as aching like and average 5-6/10 , she saw him recently and is back on Tramadol   Migraine headache: she is on Topamax for prevention,  usually triggered by barometric pressure changes. Ruel Favors first now because Imitrex makes her feel heavy and tired. Bernita Raisin works within 30 minutes and no side effects. She has been under more stress and weather changes and had increase in episodes recently about twice a week.    Herpes Type II: she had a small lesion when she was seen by [name] and culture was positive for herpes, she was given Valtrex, no other outbreaks since. Same  Hematuria: seen at [facility] in 09/13/2010 , had multiple CT, last one in 09-13-10 and stable liver lesions no further evaluation, patient states also told the same by Urologist for negative hematuria evaluation .Does not have follow up, she states she has been released from their care . Same    ADD she has been taking Vyvanse since Fall 2021 she states it helps her stay alert all day, no longer crashing at the end of the day, she was on Adderal previously but did not last all day.   No side effects, bp is at goal .     COPD Mild:Last flare was Dec 2019 took Zpack and tessalon perles. No longer smoking - quit about 12 years ago.  She was using Combivent more often end of September 13, 2018, she is now back on Anoro and is doing well, only uses Combivent prn she states it depends on the weather    B12 deficiency: found in June 2016, currently only using SL B12 daily , last level was in the 400's , we will recheck level today    Major Depression in partial Remission: on Pristiq and off  AlprazolamSymptoms started when husband was diagnosed with lung cancer and died in 12-Sep-2008. Currently  Duloxetine, no side effects, Phq 9 is slightly worse, her boyfriend's mother had a pelvic fracture and has been helping her out    Hypothyroidism: she is taking levothyroxine, no hair loss, no change in bowel movements She continues to feel tired, but last TSH was normal . We will recheck labs today   CKI stage III denies pruritis or decrease in urine output, bp towards high end of normal, we will continue to monitor and consider adding ace. Discussed importance of avoiding NSAID's again., she lost some weight and wants to monitor for now    Atherosclerosis of aorta: she prefers not taking rx medication, she will try red rice yeast at home.  Reviewed last labs Last LDL was 150. We will recheck labs today    Dysphagia/GERD: seen by Dr. Allegra Lai and had dilation done 09/13/2018 and is doing well since No dysphagia . She is taking PPI and symptoms are controlled  Unchanged   Patient Active Problem List   Diagnosis Date Noted   Esophageal dysphagia    Adult hypothyroidism 08/15/2017   Atherosclerosis of aorta (HCC) 09/22/2016   Hiatal hernia 09/22/2016   Abnormal liver CT 09/11/2016   H/O herpes simplex type  2 infection 08/20/2015   Hematuria 02/19/2015   B12 deficiency 02/19/2015   Attention deficit hyperactivity disorder (ADHD), predominantly inattentive type 11/13/2014   COPD, mild (HCC) 11/13/2014   Major depression in partial remission (HCC) 11/13/2014   History of basal cell carcinoma 11/13/2014   Gastroesophageal reflux disease without esophagitis 11/13/2014   History of abnormal cervical Pap smear 11/13/2014   Migraine without aura and without status migrainosus, not intractable 11/13/2014   Vitamin D deficiency 11/13/2014   Liver lesion 10/04/2010    Past Surgical History:  Procedure Laterality Date   ABDOMINAL HYSTERECTOMY  2004   partial   BREAST SURGERY     biopsies    ESOPHAGOGASTRODUODENOSCOPY (EGD) WITH PROPOFOL N/A 08/02/2018   Procedure: ESOPHAGOGASTRODUODENOSCOPY (EGD) WITH PROPOFOL;  Surgeon: [name], MD;  Location: [facility];  Service: Gastroenterology;  Laterality: N/A;   FRACTURE SURGERY     rode in femur   POLYPECTOMY     vocal chord polyps    Family History  Problem Relation Age of Onset   Colon cancer Father    Breast cancer Sister    Diverticulitis Brother    Diverticulitis Brother     Social History   Tobacco Use   Smoking status: Former    Packs/day: 1.00    Years: 25.00    Pack years: 25.00    Types: Cigarettes    Quit date: 05/25/2004    Years since quitting: 16.7   Smokeless tobacco: Never  Substance Use Topics   Alcohol use: No    Alcohol/week: 0.0 standard drinks     Current Outpatient Medications:    ANORO ELLIPTA 62.5-25 MCG/INH AEPB, Inhale 1 puff into the lungs daily., Disp: 60 each, Rfl: 5   cholecalciferol (VITAMIN D) 1000 UNITS tablet, Take 2,000 Units by mouth daily., Disp: , Rfl:    DULoxetine (CYMBALTA) 60 MG capsule, Take 1 capsule (60 mg total) by mouth daily., Disp: 90 capsule, Rfl: 1   Ipratropium-Albuterol (COMBIVENT RESPIMAT) 20-100 MCG/ACT AERS respimat, Inhale 1 puff into the lungs every 6 (six) hours as needed for wheezing., Disp: 12 g, Rfl: 1   levothyroxine (SYNTHROID) 25 MCG tablet, Take 1-2 tablets (25-50 mcg total) by mouth daily before breakfast. Take 50 mcg four days a week and 25 mcg 3 days a week, Disp: 150 tablet, Rfl: 0   lisdexamfetamine (VYVANSE) 30 MG capsule, Take 1 capsule (30 mg total) by mouth daily., Disp: 90 capsule, Rfl: 0   omeprazole (PRILOSEC) 40 MG capsule, Take 1 capsule (40 mg total) by mouth daily., Disp: 90 capsule, Rfl: 1   SUMAtriptan (IMITREX) 100 MG tablet, Take 1 tablet (100 mg total) by mouth as needed for migraine. May repeat in 2 hours if headache persists or recurs., Disp: 18 tablet, Rfl: 1   topiramate (TOPAMAX) 100 MG tablet, Take 1 tablet (100 mg  total) by mouth 2 (two) times daily., Disp: 180 tablet, Rfl: 1   traMADol (ULTRAM) 50 MG tablet, Take 1 tablet by mouth 2 (two) times daily as needed., Disp: , Rfl:    Ubrogepant (UBRELVY) 100 MG TABS, Take 1 tablet by mouth daily as needed., Disp: 10 tablet, Rfl: 2   valACYclovir (VALTREX) 500 MG tablet, TAKE 2 TABLETS BY MOUTH TWICE DAILY FOR 10 DAYS, Disp: 20 tablet, Rfl: 0  Allergies  Allergen Reactions   Codeine    Pantoprazole     Flu like syptoms    I personally reviewed active problem list, medication list, allergies, family history, social history,  health maintenance with the patient/caregiver today.   ROS  Constitutional: Negative for fever or weight change.  Respiratory: Negative for cough and shortness of breath.   Cardiovascular: Negative for chest pain or palpitations.  Gastrointestinal: Negative for abdominal pain, no bowel changes.  Musculoskeletal: Negative for gait problem or joint swelling.  Skin: Negative for rash.  Neurological: Negative for dizziness , positive for intermittent  headache.  No other specific complaints in a complete review of systems (except as listed in HPI above).   Objective  Vitals:   02/04/21 0837  BP: 130/84  Pulse: 98  Resp: 16  Temp: 98 F (36.7 C)  SpO2: 97%  Weight: 166 lb (75.3 kg)  Height: 5\' 3"  (1.6 m)    Body mass index is 29.41 kg/m.  Physical Exam  Constitutional: Patient appears well-developed and well-nourished. Obese  No distress.  HEENT: head atraumatic, normocephalic, pupils equal and reactive to light, neck supple Cardiovascular: Normal rate, regular rhythm and normal heart sounds.  No murmur heard. No BLE edema. Pulmonary/Chest: Effort normal and breath sounds normal. No respiratory distress. Abdominal: Soft.  There is no tenderness. Psychiatric: Patient has a normal mood and affect. behavior is normal. Judgment and thought content normal.   PHQ2/9: Depression screen [facility] 2/9 02/04/2021 11/12/2020 07/30/2020  04/15/2020 04/15/2020  Decreased Interest 2 1 2 1 1   Down, Depressed, Hopeless 2 1 2  0 0  PHQ - 2 Score 4 2 4 1 1   Altered sleeping 0 3 3 2  -  Tired, decreased energy 3 3 3 3  -  Change in appetite 0 0 0 1 -  Feeling bad or failure about yourself  0 0 0 0 -  Trouble concentrating 3 0 3 1 -  Moving slowly or fidgety/restless 0 0 0 0 -  Suicidal thoughts 0 0 0 0 -  PHQ-9 Score 10 8 13 8  -  Difficult doing work/chores - - - Somewhat difficult -  Some recent data might be hidden    phq 9 is positive   Fall Risk: Fall Risk  02/04/2021 11/12/2020 07/30/2020 04/15/2020 01/11/2020  Falls in the past year? 0 0 0 0 0  Number falls in past yr: 0 0 0 0 0  Comment - - - - -  Injury with Fall? 0 0 0 0 0  Risk for fall due to : No Fall Risks - - - -  Follow up Falls prevention discussed - - - -      Functional Status Survey: Is the patient deaf or have difficulty hearing?: No Does the patient have difficulty seeing, even when wearing glasses/contacts?: No Does the patient have difficulty concentrating, remembering, or making decisions?: No Does the patient have difficulty walking or climbing stairs?: No Does the patient have difficulty dressing or bathing?: No Does the patient have difficulty doing errands alone such as visiting a doctor's office or shopping?: No    Assessment & Plan  1. Stage 3a chronic kidney disease (HCC)  - COMPLETE METABOLIC PANEL WITH GFR - CBC with Differential/Platelet  2. Moderate major depression (HCC)  - DULoxetine (CYMBALTA) 60 MG capsule; Take 1 capsule (60 mg total) by mouth daily.  Dispense: 90 capsule; Refill: 1  3. COPD, mild (HCC)  - ANORO ELLIPTA 62.5-25 MCG/INH AEPB; Inhale 1 puff into the lungs daily.  Dispense: 60 each; Refill: 5 - Ipratropium-Albuterol (COMBIVENT RESPIMAT) 20-100 MCG/ACT AERS respimat; Inhale 1 puff into the lungs every 6 (six) hours as needed for wheezing.  Dispense: 12 g;  Refill: 1  4. Attention deficit hyperactivity  disorder (ADHD), predominantly inattentive type  - lisdexamfetamine (VYVANSE) 30 MG capsule; Take 1 capsule (30 mg total) by mouth daily.  Dispense: 90 capsule; Refill: 0  5. B12 deficiency  - CBC with Differential/Platelet - Vitamin B12  6. Hypothyroidism in adult  - TSH - levothyroxine (SYNTHROID) 25 MCG tablet; Take 1-2 tablets (25-50 mcg total) by mouth daily before breakfast. Take 50 mcg four days a week and 25 mcg 3 days a week  Dispense: 150 tablet; Refill: 0  7. GERD without esophagitis  - omeprazole (PRILOSEC) 40 MG capsule; Take 1 capsule (40 mg total) by mouth daily.  Dispense: 90 capsule; Refill: 1  8. Migraine without aura and without status migrainosus, not intractable  - SUMAtriptan (IMITREX) 100 MG tablet; Take 1 tablet (100 mg total) by mouth as needed for migraine. May repeat in 2 hours if headache persists or recurs.  Dispense: 18 tablet; Refill: 1 - topiramate (TOPAMAX) 100 MG tablet; Take 1 tablet (100 mg total) by mouth 2 (two) times daily.  Dispense: 180 tablet; Refill: 1 - Ubrogepant (UBRELVY) 100 MG TABS; Take 1 tablet by mouth daily as needed.  Dispense: 6 tablet; Refill: 2  9. Vitamin D deficiency  - VITAMIN D 25 Hydroxy (Vit-D Deficiency, Fractures)  10. Dyslipidemia  - Lipid panel  11. Atherosclerosis of aorta [facility](HCC)  Not ready for statins but she states she will reconsider depending on lab results   12. Hyperglycemia  - Hemoglobin A1c  13. Insomnia due to other mental disorder   14. S/P balloon dilatation of esophageal stricture  - omeprazole (PRILOSEC) 40 MG capsule; Take 1 capsule (40 mg total) by mouth daily.  Dispense: 90 capsule; Refill: 1

## 2021-02-04 ENCOUNTER — Ambulatory Visit: Payer: 59 | Admitting: Family Medicine

## 2021-02-04 ENCOUNTER — Encounter: Payer: Self-pay | Admitting: Family Medicine

## 2021-02-04 ENCOUNTER — Other Ambulatory Visit: Payer: Self-pay

## 2021-02-04 VITALS — BP 130/84 | HR 98 | Temp 98.0°F | Resp 16 | Ht 63.0 in | Wt 166.0 lb

## 2021-02-04 DIAGNOSIS — R739 Hyperglycemia, unspecified: Secondary | ICD-10-CM

## 2021-02-04 DIAGNOSIS — F99 Mental disorder, not otherwise specified: Secondary | ICD-10-CM

## 2021-02-04 DIAGNOSIS — N1831 Chronic kidney disease, stage 3a: Secondary | ICD-10-CM

## 2021-02-04 DIAGNOSIS — F321 Major depressive disorder, single episode, moderate: Secondary | ICD-10-CM | POA: Diagnosis not present

## 2021-02-04 DIAGNOSIS — Z9889 Other specified postprocedural states: Secondary | ICD-10-CM

## 2021-02-04 DIAGNOSIS — E039 Hypothyroidism, unspecified: Secondary | ICD-10-CM

## 2021-02-04 DIAGNOSIS — E785 Hyperlipidemia, unspecified: Secondary | ICD-10-CM

## 2021-02-04 DIAGNOSIS — E538 Deficiency of other specified B group vitamins: Secondary | ICD-10-CM

## 2021-02-04 DIAGNOSIS — G43009 Migraine without aura, not intractable, without status migrainosus: Secondary | ICD-10-CM

## 2021-02-04 DIAGNOSIS — J449 Chronic obstructive pulmonary disease, unspecified: Secondary | ICD-10-CM

## 2021-02-04 DIAGNOSIS — E559 Vitamin D deficiency, unspecified: Secondary | ICD-10-CM

## 2021-02-04 DIAGNOSIS — K219 Gastro-esophageal reflux disease without esophagitis: Secondary | ICD-10-CM

## 2021-02-04 DIAGNOSIS — F900 Attention-deficit hyperactivity disorder, predominantly inattentive type: Secondary | ICD-10-CM

## 2021-02-04 DIAGNOSIS — F5105 Insomnia due to other mental disorder: Secondary | ICD-10-CM

## 2021-02-04 DIAGNOSIS — I700 Atherosclerosis of aorta: Secondary | ICD-10-CM

## 2021-02-04 MED ORDER — TOPIRAMATE 100 MG PO TABS: 100.0000 mg | ORAL_TABLET | Freq: Two times a day (BID) | ORAL | 1 refills | Status: DC

## 2021-02-04 MED ORDER — SUMATRIPTAN SUCCINATE 100 MG PO TABS: 100.0000 mg | ORAL_TABLET | ORAL | 1 refills | Status: DC | PRN

## 2021-02-04 MED ORDER — DULOXETINE HCL 60 MG PO CPEP: 60.0000 mg | ORAL_CAPSULE | Freq: Every day | ORAL | 1 refills | Status: DC

## 2021-02-04 MED ORDER — OMEPRAZOLE 40 MG PO CPDR: 40.0000 mg | DELAYED_RELEASE_CAPSULE | Freq: Every day | ORAL | 1 refills | Status: DC

## 2021-02-04 MED ORDER — LEVOTHYROXINE SODIUM 25 MCG PO TABS: 25.0000 ug | ORAL_TABLET | Freq: Every day | ORAL | 0 refills | Status: DC

## 2021-02-04 MED ORDER — ANORO ELLIPTA 62.5-25 MCG/INH IN AEPB: 1.0000 | INHALATION_SPRAY | Freq: Every day | RESPIRATORY_TRACT | 5 refills | Status: DC

## 2021-02-04 MED ORDER — LISDEXAMFETAMINE DIMESYLATE 30 MG PO CAPS: 30.0000 mg | ORAL_CAPSULE | Freq: Every day | ORAL | 0 refills | Status: DC

## 2021-02-04 MED ORDER — COMBIVENT RESPIMAT 20-100 MCG/ACT IN AERS: 1.0000 | INHALATION_SPRAY | Freq: Four times a day (QID) | RESPIRATORY_TRACT | 1 refills | Status: DC | PRN

## 2021-02-04 MED ORDER — UBRELVY 100 MG PO TABS: 1.0000 | ORAL_TABLET | Freq: Every day | ORAL | 2 refills | Status: DC | PRN

## 2021-02-05 LAB — LIPID PANEL
Cholesterol: 231 mg/dL — ABNORMAL HIGH (ref <200)
HDL: 49 mg/dL — ABNORMAL LOW (ref >=50)
LDL Cholesterol (Calc): 148 mg/dL (calc) — ABNORMAL HIGH
Non-HDL Cholesterol (Calc): 182 mg/dL (calc) — ABNORMAL HIGH (ref <130)
Total CHOL/HDL Ratio: 4.7 (calc) (ref <5.0)
Triglycerides: 207 mg/dL — ABNORMAL HIGH (ref <150)

## 2021-02-05 LAB — CBC WITH DIFFERENTIAL/PLATELET
Absolute Monocytes: 348 cells/uL (ref 200–950)
Basophils Absolute: 40 cells/uL (ref 0–200)
Basophils Relative: 0.9 %
Eosinophils Absolute: 88 cells/uL (ref 15–500)
Eosinophils Relative: 2 %
HCT: 44.6 % (ref 35.0–45.0)
Hemoglobin: 14.6 g/dL (ref 11.7–15.5)
Lymphs Abs: 1285 cells/uL (ref 850–3900)
MCH: 27.8 pg (ref 27.0–33.0)
MCHC: 32.7 g/dL (ref 32.0–36.0)
MCV: 85 fL (ref 80.0–100.0)
MPV: 9.9 fL (ref 7.5–12.5)
Monocytes Relative: 7.9 %
Neutro Abs: 2640 cells/uL (ref 1500–7800)
Neutrophils Relative %: 60 %
Platelets: 196 10*3/uL (ref 140–400)
RBC: 5.25 10*6/uL — ABNORMAL HIGH (ref 3.80–5.10)
RDW: 14.3 % (ref 11.0–15.0)
Total Lymphocyte: 29.2 %
WBC: 4.4 10*3/uL (ref 3.8–10.8)

## 2021-02-05 LAB — HEMOGLOBIN A1C
Hgb A1c MFr Bld: 5.7 % of total Hgb — ABNORMAL HIGH (ref <5.7)
Mean Plasma Glucose: 117 mg/dL
eAG (mmol/L): 6.5 mmol/L

## 2021-02-05 LAB — COMPLETE METABOLIC PANEL WITH GFR
AG Ratio: 1.7 (calc) (ref 1.0–2.5)
ALT: 13 U/L (ref 6–29)
AST: 13 U/L (ref 10–35)
Albumin: 4.5 g/dL (ref 3.6–5.1)
Alkaline phosphatase (APISO): 95 U/L (ref 37–153)
BUN: 20 mg/dL (ref 7–25)
CO2: 27 mmol/L (ref 20–32)
Calcium: 9.5 mg/dL (ref 8.6–10.4)
Chloride: 106 mmol/L (ref 98–110)
Creat: 0.95 mg/dL (ref 0.50–1.03)
Globulin: 2.7 g/dL (calc) (ref 1.9–3.7)
Glucose, Bld: 132 mg/dL — ABNORMAL HIGH (ref 65–99)
Potassium: 4 mmol/L (ref 3.5–5.3)
Sodium: 142 mmol/L (ref 135–146)
Total Bilirubin: 0.4 mg/dL (ref 0.2–1.2)
Total Protein: 7.2 g/dL (ref 6.1–8.1)
eGFR: 69 mL/min/{1.73_m2} (ref >=60)

## 2021-02-05 LAB — TSH: TSH: 2.22 mIU/L (ref 0.40–4.50)

## 2021-02-05 LAB — VITAMIN D 25 HYDROXY (VIT D DEFICIENCY, FRACTURES): Vit D, 25-Hydroxy: 27 ng/mL — ABNORMAL LOW (ref 30–100)

## 2021-02-05 LAB — VITAMIN B12: Vitamin B-12: 347 pg/mL (ref 200–1100)

## 2021-02-06 ENCOUNTER — Other Ambulatory Visit: Payer: Self-pay | Admitting: Family Medicine

## 2021-02-06 DIAGNOSIS — G43009 Migraine without aura, not intractable, without status migrainosus: Secondary | ICD-10-CM

## 2021-02-06 DIAGNOSIS — K219 Gastro-esophageal reflux disease without esophagitis: Secondary | ICD-10-CM

## 2021-02-06 DIAGNOSIS — Z9889 Other specified postprocedural states: Secondary | ICD-10-CM

## 2021-02-25 IMAGING — CR DG FEMUR 2+V*L*
1 series · 4 of 4 positions shown · non-contrast
Comparison: None.

CLINICAL DATA: Pre MRI evaluation screening. History of femur
fracture repair 20 years ago.

EXAM:
LEFT FEMUR 2 VIEWS

[Series 1: dg femur min 2 views left · 0.14mm/px · 4 of 4 slices shown]
[im 1/4]
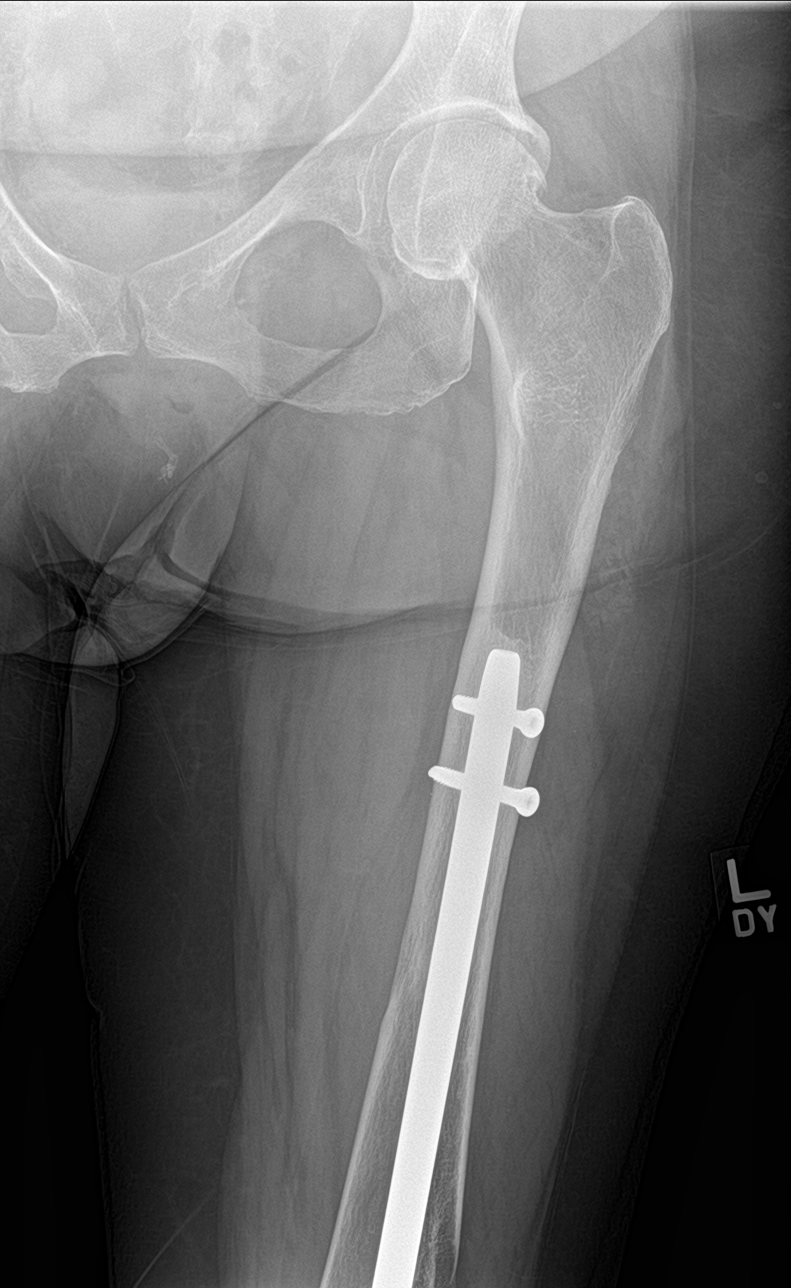
[im 2/4]
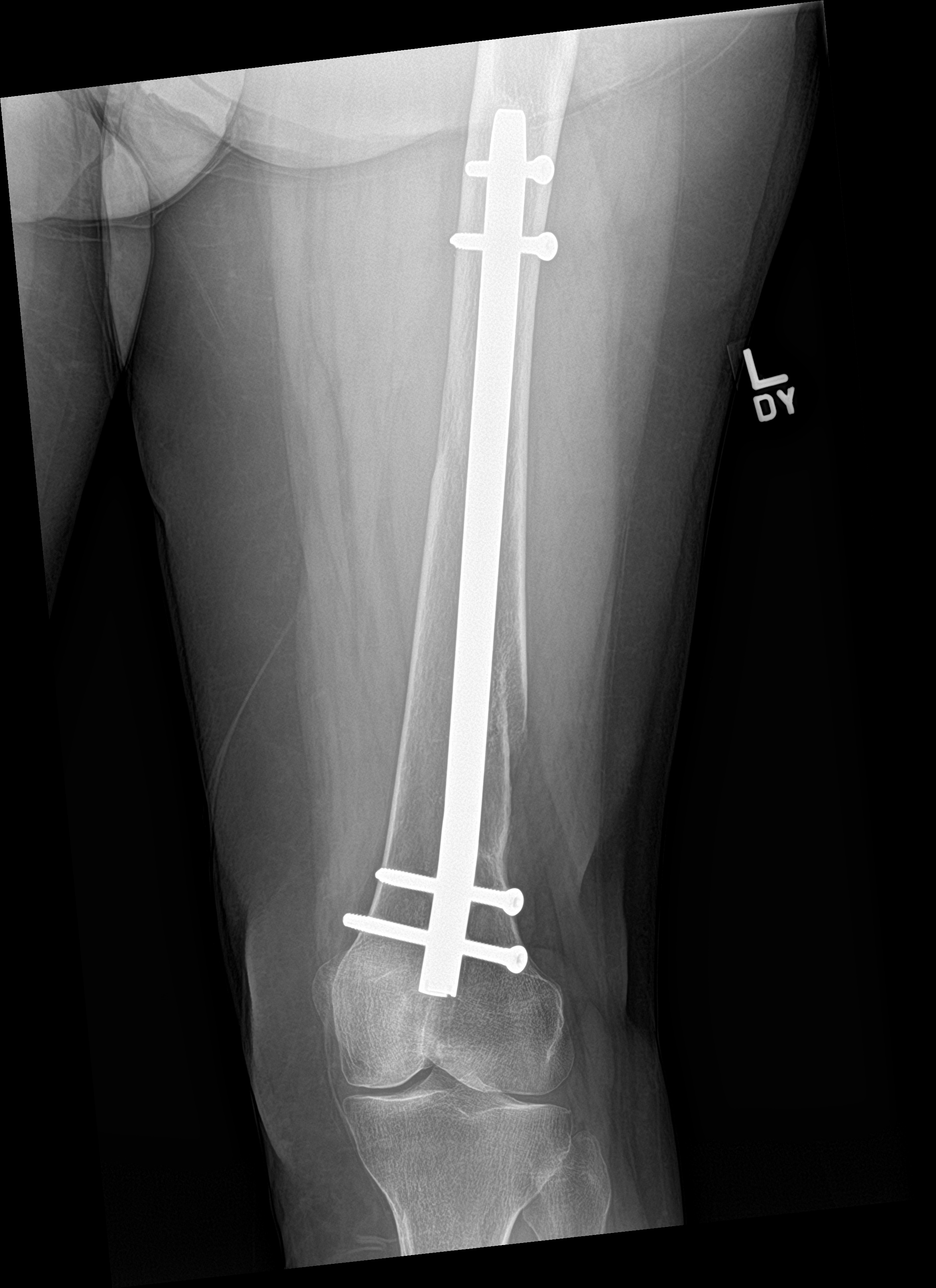
[im 3/4]
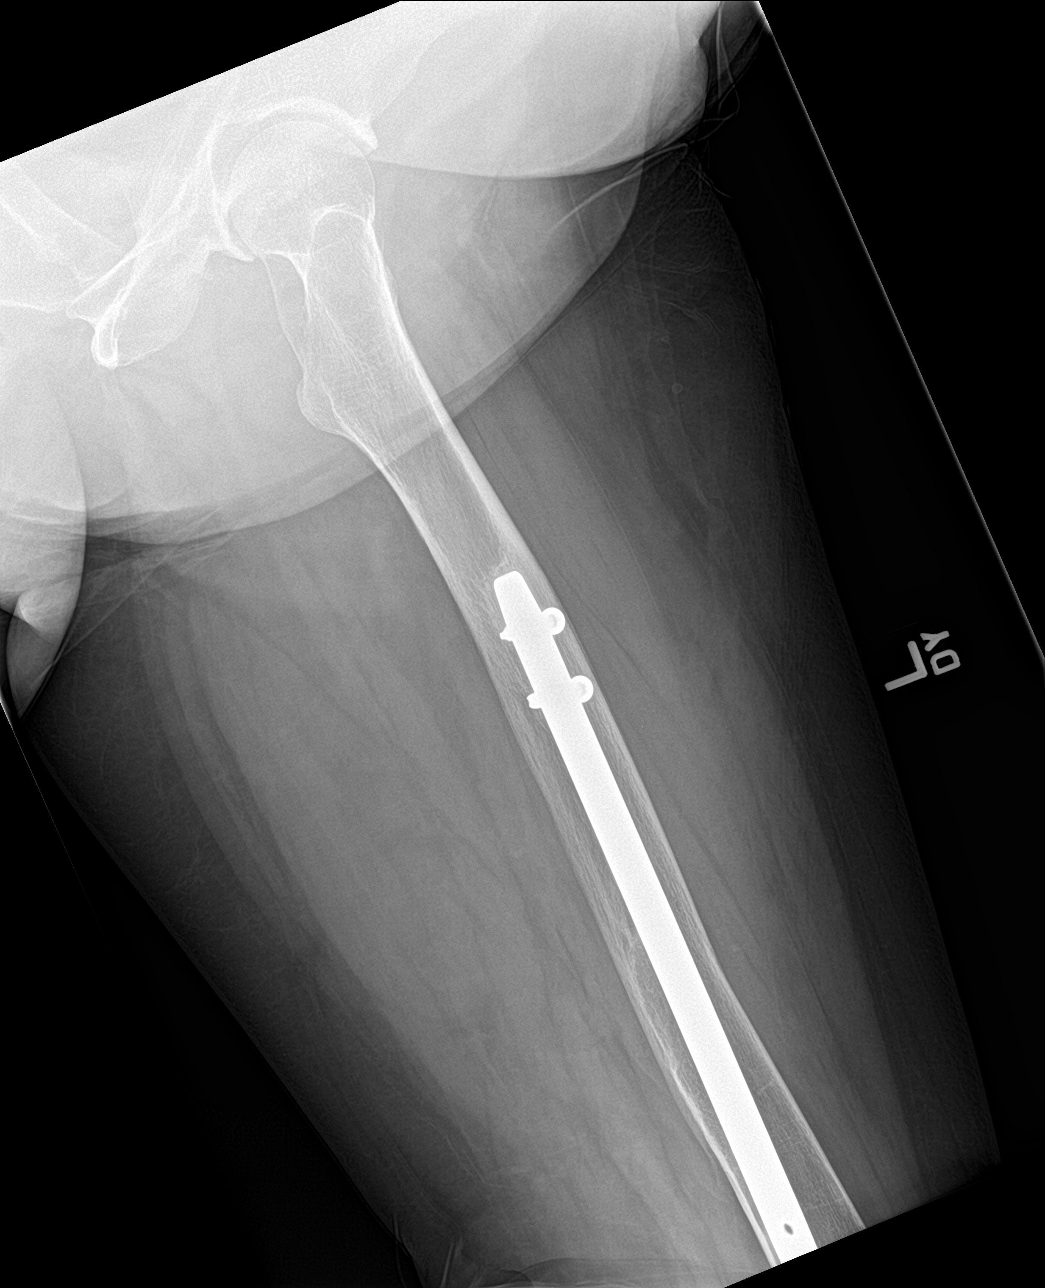
[im 4/4]
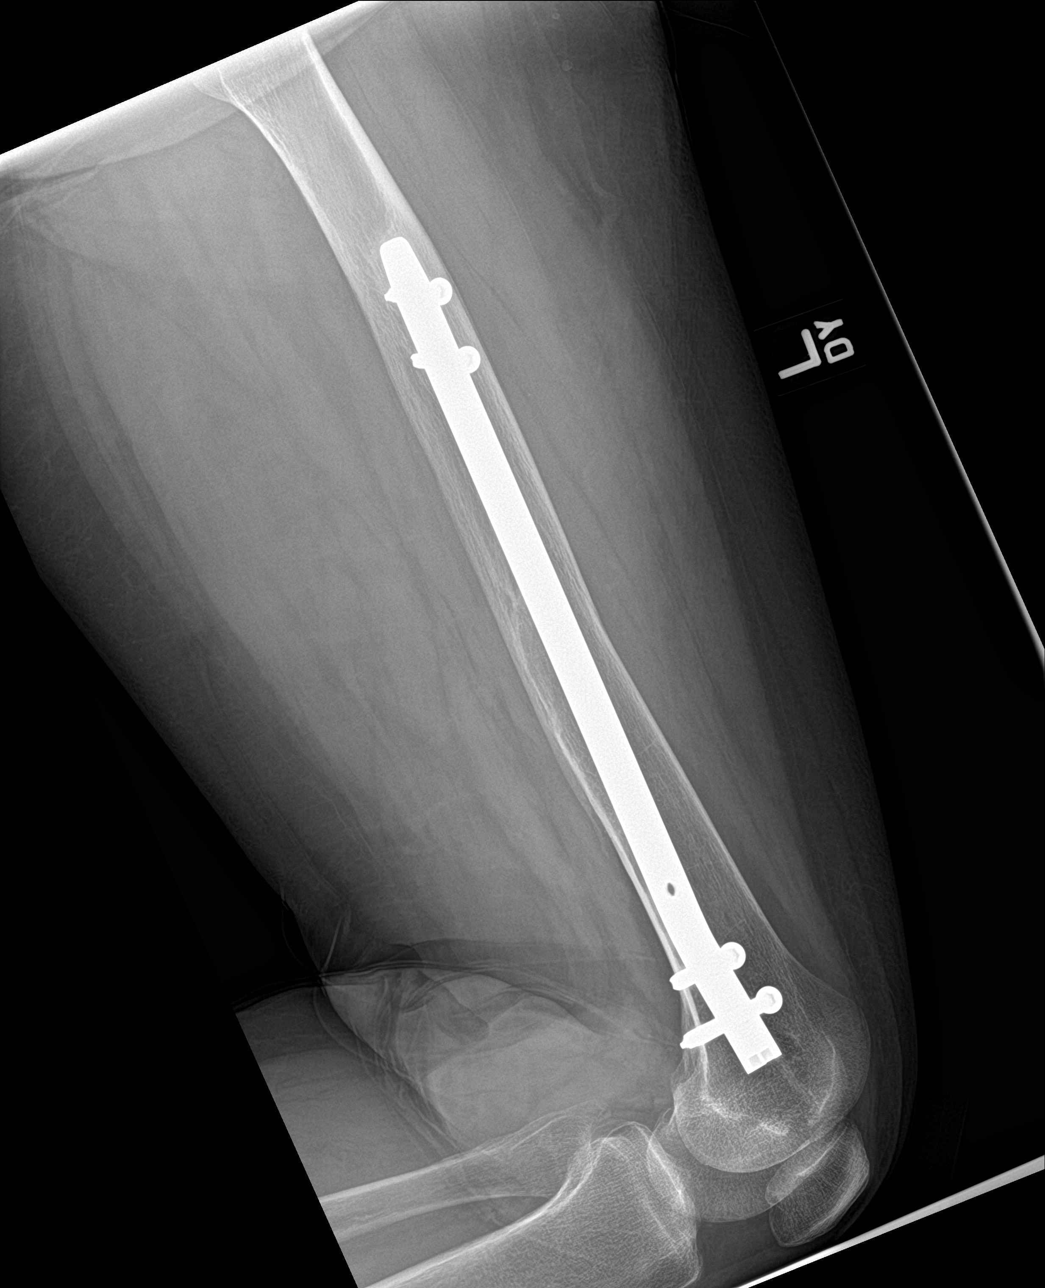

[4 of 4 positions shown; findings below may reference images not displayed]

FINDINGS: Screw and rod fixation of an old, healed distal left femur fracture.
No acute fracture or dislocation.
IMPRESSION: Hardware fixation of an old, healed distal left femur fracture. This
does not preclude MRI.

## 2021-02-26 ENCOUNTER — Encounter: Payer: Self-pay | Admitting: Family Medicine

## 2021-05-05 NOTE — Progress Notes (Signed)
Name: Jane Patrick   MRN: [id number]    DOB: 1961-01-05   Date:05/06/2021       Progress Note  Subjective  Chief Complaint  Follow Up  I connected with  Jane Patrick  on 05/06/21 at 11:40 AM EST by a video enabled telemedicine application and verified that I am speaking with the correct person using two identifiers.  I discussed the limitations of evaluation and management by telemedicine and the availability of in person appointments. The patient expressed understanding and agreed to proceed with the virtual visit  Staff also discussed with the patient that there may be a patient responsible charge related to this service. Patient Location: at home  Provider Location: [facility] Additional Individuals present: alone   HPI    ADD she has been taking Vyvanse since Fall 2021 she states it helps her stay alert all day, no longer crashing at the end of the day, she was on Adderal previously but did not last all day. She is happy with current dose of medication   Major Depression in partial Remission: on Pristiq and off AlprazolamSymptoms started when husband was diagnosed with lung cancer and died in 2008-09-19. . Currently  Duloxetine, no side effects, Phq 9 is slightly worse, her boyfriend's mother had a pelvic fracture and has been helping her out since August , she is feeling tired, but her boyfriend has been taking care of the house hold chores when she is home     Patient Active Problem List   Diagnosis Date Noted   Esophageal dysphagia    Adult hypothyroidism 08/15/2017   Atherosclerosis of aorta ([place]) 09/22/2016   Hiatal hernia 09/22/2016   Abnormal liver CT 09/11/2016   H/O herpes simplex type 2 infection 08/20/2015   Hematuria 02/19/2015   B12 deficiency 02/19/2015   Attention deficit hyperactivity disorder (ADHD), predominantly inattentive type 11/13/2014   COPD, mild ([place]) 11/13/2014   Major depression in partial remission ([place]) 11/13/2014   History of basal cell carcinoma  11/13/2014   Gastroesophageal reflux disease without esophagitis 11/13/2014   History of abnormal cervical Pap smear 11/13/2014   Migraine without aura and without status migrainosus, not intractable 11/13/2014   Vitamin D deficiency 11/13/2014   Liver lesion 10/04/2010    Past Surgical History:  Procedure Laterality Date   ABDOMINAL HYSTERECTOMY  Sep 20, 2002   partial   BREAST SURGERY     biopsies   ESOPHAGOGASTRODUODENOSCOPY (EGD) WITH PROPOFOL N/A 08/02/2018   Procedure: ESOPHAGOGASTRODUODENOSCOPY (EGD) WITH PROPOFOL;  Surgeon: Lin Landsman, MD;  Location: [place];  Service: Gastroenterology;  Laterality: N/A;   FRACTURE SURGERY     rode in femur   POLYPECTOMY     vocal chord polyps    Family History  Problem Relation Age of Onset   Colon cancer Father    Breast cancer Sister    Diverticulitis Brother    Diverticulitis Brother     Social History   Socioeconomic History   Marital status: Soil scientist    Spouse name: Not on file   Number of children: 2   Years of education: Not on file   Highest education level: Associate degree: occupational, Hotel manager, or vocational program  Occupational History   Occupation: Architectural technologist   Tobacco Use   Smoking status: Former    Packs/day: 1.00    Years: 25.00    Pack years: 25.00    Types: Cigarettes    Quit date: 05/25/2004    Years since quitting: 68.9  Smokeless tobacco: Never  Vaping Use   Vaping Use: Never used  Substance and Sexual Activity   Alcohol use: No    Alcohol/week: 0.0 standard drinks   Drug use: No   Sexual activity: Yes    Partners: Male  Other Topics Concern   Not on file  Social History Narrative   Patient was laid off from May till Nov 2019, but is working again . Works from home for a [employer]    She is engaged    Investment banker, operational of Radio broadcast assistant Strain: Not on [employer] Insecurity: Not on file  Transportation Needs: Not on file  Physical Activity:  Not on file  Stress: Not on file  Social Connections: Not on file  Intimate Partner Violence: Not on file     Current Outpatient Medications:    ANORO ELLIPTA 62.5-25 MCG/INH AEPB, Inhale 1 puff into the lungs daily., Disp: 60 each, Rfl: 5   cholecalciferol (VITAMIN D) 1000 UNITS tablet, Take 2,000 Units by mouth daily., Disp: , Rfl:    DULoxetine (CYMBALTA) 60 MG capsule, Take 1 capsule (60 mg total) by mouth daily., Disp: 90 capsule, Rfl: 1   Ipratropium-Albuterol (COMBIVENT RESPIMAT) 20-100 MCG/ACT AERS respimat, Inhale 1 puff into the lungs every 6 (six) hours as needed for wheezing., Disp: 12 g, Rfl: 1   levothyroxine (SYNTHROID) 25 MCG tablet, Take 1-2 tablets (25-50 mcg total) by mouth daily before breakfast. Take 50 mcg four days a week and 25 mcg 3 days a week, Disp: 150 tablet, Rfl: 0   lisdexamfetamine (VYVANSE) 30 MG capsule, Take 1 capsule (30 mg total) by mouth daily., Disp: 90 capsule, Rfl: 0   omeprazole (PRILOSEC) 40 MG capsule, Take 1 capsule (40 mg total) by mouth daily., Disp: 90 capsule, Rfl: 1   SUMAtriptan (IMITREX) 100 MG tablet, Take 1 tablet (100 mg total) by mouth as needed for migraine. May repeat in 2 hours if headache persists or recurs., Disp: 18 tablet, Rfl: 1   topiramate (TOPAMAX) 100 MG tablet, Take 1 tablet (100 mg total) by mouth 2 (two) times daily., Disp: 180 tablet, Rfl: 1   traMADol (ULTRAM) 50 MG tablet, Take 1 tablet by mouth 2 (two) times daily as needed., Disp: , Rfl:    Ubrogepant (UBRELVY) 100 MG TABS, Take 1 tablet by mouth daily as needed., Disp: 6 tablet, Rfl: 2   valACYclovir (VALTREX) 500 MG tablet, TAKE 2 TABLETS BY MOUTH TWICE DAILY FOR 10 DAYS, Disp: 20 tablet, Rfl: 0  Allergies  Allergen Reactions   Codeine    Pantoprazole     Flu like syptoms    I personally reviewed active problem list, medication list, allergies, family history, social history, health maintenance with the patient/caregiver today.   ROS  Ten systems reviewed  and is negative except as mentioned in HPI   Objective  Virtual encounter, vitals not obtained.  There is no height or weight on file to calculate BMI.  Physical Exam  Awake, alert and oriented   PHQ2/9: Depression screen [facility] 2/9 05/06/2021 02/04/2021 11/12/2020 07/30/2020 04/15/2020  Decreased Interest 1 2 1 2 1   Down, Depressed, Hopeless 0 2 1 2  0  PHQ - 2 Score 1 4 2 4 1   Altered sleeping 3 0 3 3 2   Tired, decreased energy 3 3 3 3 3   Change in appetite 0 0 0 0 1  Feeling bad or failure about yourself  0 0 0 0 0  Trouble concentrating  0 3 0 3 1  Moving slowly or fidgety/restless 0 0 0 0 0  Suicidal thoughts 0 0 0 0 0  PHQ-9 Score 7 10 8 13 8   Difficult doing work/chores Somewhat difficult - - - Somewhat difficult  Some recent data might be hidden   PHQ-2/9 Result is positive but lately taking care of her partner's mother and cannot sleep well   Fall Risk: Fall Risk  05/06/2021 02/04/2021 11/12/2020 07/30/2020 04/15/2020  Falls in the past year? 0 0 0 0 0  Number falls in past yr: 0 0 0 0 0  Comment - - - - -  Injury with Fall? 0 0 0 0 0  Risk for fall due to : No Fall Risks No Fall Risks - - -  Follow up Falls prevention discussed Falls prevention discussed - - -     Assessment & Plan  1. Attention deficit hyperactivity disorder (ADHD), predominantly inattentive type  - lisdexamfetamine (VYVANSE) 30 MG capsule; Take 1 capsule (30 mg total) by mouth daily.  Dispense: 90 capsule; Refill: 0  2. Moderate major depression (HCC)  - lisdexamfetamine (VYVANSE) 30 MG capsule; Take 1 capsule (30 mg total) by mouth daily.  Dispense: 90 capsule; Refill: 0   I discussed the assessment and treatment plan with the patient. The patient was provided an opportunity to ask questions and all were answered. The patient agreed with the plan and demonstrated an understanding of the instructions.  The patient was advised to call back or seek an in-person evaluation if the symptoms worsen or if  the condition fails to improve as anticipated.  I provided 15  minutes of non-face-to-face time during this encounter.

## 2021-05-06 ENCOUNTER — Encounter: Payer: Self-pay | Admitting: Family Medicine

## 2021-05-06 ENCOUNTER — Telehealth (INDEPENDENT_AMBULATORY_CARE_PROVIDER_SITE_OTHER): Payer: 59 | Admitting: Family Medicine

## 2021-05-06 DIAGNOSIS — F321 Major depressive disorder, single episode, moderate: Secondary | ICD-10-CM

## 2021-05-06 DIAGNOSIS — F900 Attention-deficit hyperactivity disorder, predominantly inattentive type: Secondary | ICD-10-CM | POA: Diagnosis not present

## 2021-05-06 MED ORDER — LISDEXAMFETAMINE DIMESYLATE 30 MG PO CAPS: 30.0000 mg | ORAL_CAPSULE | Freq: Every day | ORAL | 0 refills | Status: DC

## 2021-06-02 ENCOUNTER — Other Ambulatory Visit: Payer: Self-pay

## 2021-06-02 DIAGNOSIS — Z9889 Other specified postprocedural states: Secondary | ICD-10-CM

## 2021-06-02 DIAGNOSIS — K219 Gastro-esophageal reflux disease without esophagitis: Secondary | ICD-10-CM

## 2021-06-02 MED ORDER — OMEPRAZOLE 40 MG PO CPDR: 40.0000 mg | DELAYED_RELEASE_CAPSULE | Freq: Every day | ORAL | 1 refills | Status: DC

## 2021-06-10 ENCOUNTER — Encounter: Payer: Self-pay | Admitting: Family Medicine

## 2021-06-11 ENCOUNTER — Other Ambulatory Visit: Payer: Self-pay

## 2021-06-15 ENCOUNTER — Other Ambulatory Visit: Payer: Self-pay | Admitting: Family Medicine

## 2021-06-15 DIAGNOSIS — E039 Hypothyroidism, unspecified: Secondary | ICD-10-CM

## 2021-06-15 NOTE — Telephone Encounter (Signed)
Requested Prescriptions  Pending Prescriptions Disp Refills   levothyroxine (SYNTHROID) 25 MCG tablet [Pharmacy Med Name: LEVOTHYROXINE 0.025MG  ( ) TAB] 150 tablet 1    Sig: TAKE 2 TABLETS BY MOUTH 4 DAYS A WEEK AND 1 TABLET 3 DAYS A WEEK. TAKE DAILY BEFORE BREAKFAST     Endocrinology:  Hypothyroid Agents Failed - 06/15/2021  6:23 AM      Failed - TSH needs to be rechecked within 3 months after an abnormal result. Refill until TSH is due.      Passed - TSH in normal range and within 360 days    TSH  Date Value Ref Range Status  02/04/2021 2.22 0.40 - 4.50 mIU/L Final         Passed - Valid encounter within last 12 months    Recent Outpatient Visits          1 month ago Attention deficit hyperactivity disorder (ADHD), predominantly inattentive type   [facility] Alba Cory, MD   4 months ago Stage 3a chronic kidney disease [facility])   [facility] Alba Cory, MD   7 months ago Stage 3a chronic kidney disease [facility])   [facility] Alba Cory, MD   10 months ago Stage 3a chronic kidney disease [facility])   [facility] Alba Cory, MD   1 year ago COPD, mild [facility])   [facility] Alba Cory, MD

## 2021-09-13 ENCOUNTER — Other Ambulatory Visit: Payer: Self-pay | Admitting: Family Medicine

## 2021-09-13 DIAGNOSIS — F321 Major depressive disorder, single episode, moderate: Secondary | ICD-10-CM

## 2021-09-15 NOTE — Telephone Encounter (Signed)
Spoke with her and yes she is still a pt of yours. She scheduled an appt with you for this coming wednesday

## 2021-09-16 NOTE — Progress Notes (Signed)
Name: Jane Patrick   MRN: [id number]    DOB: 24-Jan-1961   Date:09/17/2021 ? ?     Progress Note ? ?Subjective ? ?Chief Complaint ? ?Medication Refill ? ?HPI ? ?Chronic back pain: : she is seeing [name] and had MRI done 03/30/2019, and showed L2- 3  Disc bulge , and also a hypointense bone lesion, repeat Feb 2021 showed stability . She continues to have daily pain described as aching like and average 4/10 , she is taking Tramadol  ?  ?Migraine headache: she is on Topamax for prevention,  usually triggered by barometric pressure changes. TShe was taking Ubrelvy but not able to fill it lately, prefers Ubrelvy instead of Imitrex due to side effects of Imitrex. Episodes are about 3 times a month and can last a couple of days per episode. Throbbing and tightness associated with photophobia occasionally nausea  ?  ?Herpes Type II: she had a small lesion when she was seen by [name] and culture was positive for herpes, she was given Valtrex, no other outbreaks since. Unchanged  ? ?Hematuria: seen at [facility] in 09/15/2010 , had multiple CT, last one in 15-Sep-2010 and stable liver lesions no further evaluation, patient states also told the same by Urologist for negative hematuria evaluation .Does not have follow up, she states she has been released from their care . Unchanged ?  ?ADD she has been taking Vyvanse since Fall 2021 she states it helps her stay alert all day, no longer crashing at the end of the day, she was on Adderal previously but did not last all day however since she has been taking care of her "MIL" her schedule is sporadic, sometimes only works for 4 hours and sometimes sleeps in due to staying up all night and needs to go back to Adderal 20 mg to take up to one and half daily    No side effects, bp is at goal .   ?  ?COPD Mild: Last flare was Dec 2019 took Zpack and tessalon perles. No longer smoking - quit about 12 years ago.  She was using Combivent more often end of Sep 15, 2018, she likes Anoro - ran out of for the  past 5 days , only uses Combivent prn she states it depends on the weather  ?  ?B12 deficiency: found in June 2016, currently only using SL B12 daily.  ?  ?Major Depression in partial Remission: on Pristiq and off AlprazolamSymptoms started when husband was diagnosed with lung cancer and died in 09-14-2008. Currently  Duloxetine, no side effects, Phq 9 is slightly worse, her boyfriend's mother had a pelvic fracture and has been helping her out She continues to feel tir, sometimes up all night carrying for her  ?  ?Hypothyroidism: she is taking levothyroxine, no hair loss, no change in bowel movements She continues to feel tired, but last TSH was normal .  ? ?CKI stage III denies pruritis or decrease in urine output, bp towards high end of normal, we will continue to monitor and consider adding ace. Discussed importance of avoiding NSAID's again. Last GFR was normal and she does not want to start medications at this time  ?  ?Atherosclerosis of aorta: she prefers not taking rx medication, she will try red rice yeast at home.  Reviewed last labs Last LDL , she has been physically active , changed diet and would like hold off on rx medication at this time  ?  ?Dysphagia/GERD: seen by Dr. Allegra Lai and had  dilation done 08/2018 and is doing well since No dysphagia . She is taking PPI and symptoms are controlled   ? ?Patient Active Problem List  ? Diagnosis Date Noted  ? Esophageal dysphagia   ? Adult hypothyroidism 08/15/2017  ? Atherosclerosis of aorta (HCC) 09/22/2016  ? Hiatal hernia 09/22/2016  ? Abnormal liver CT 09/11/2016  ? H/O herpes simplex type 2 infection 08/20/2015  ? Hematuria 02/19/2015  ? B12 deficiency 02/19/2015  ? Attention deficit hyperactivity disorder (ADHD), predominantly inattentive type 11/13/2014  ? COPD, mild (HCC) 11/13/2014  ? Major depression in partial remission (HCC) 11/13/2014  ? History of basal cell carcinoma 11/13/2014  ? Gastroesophageal reflux disease without esophagitis 11/13/2014  ? History  of abnormal cervical Pap smear 11/13/2014  ? Migraine without aura and without status migrainosus, not intractable 11/13/2014  ? Vitamin D deficiency 11/13/2014  ? Liver lesion 10/04/2010  ? ? ?Past Surgical History:  ?Procedure Laterality Date  ? ABDOMINAL HYSTERECTOMY  2004  ? partial  ? BREAST SURGERY    ? biopsies  ? ESOPHAGOGASTRODUODENOSCOPY (EGD) WITH PROPOFOL N/A 08/02/2018  ? Procedure: ESOPHAGOGASTRODUODENOSCOPY (EGD) WITH PROPOFOL;  Surgeon: Toney ReilVanga, Rohini Reddy, MD;  [facility];  Service: Gastroenterology;  Laterality: N/A;  ? FRACTURE SURGERY    ? rode in femur  ? POLYPECTOMY    ? vocal chord polyps  ? ? ?Family History  ?Problem Relation Age of Onset  ? Colon cancer Father   ? Breast cancer Sister   ? Diverticulitis Brother   ? Diverticulitis Brother   ? ? ?Social History  ? ?Tobacco Use  ? Smoking status: Former  ?  Packs/day: 1.00  ?  Years: 25.00  ?  Pack years: 25.00  ?  Types: Cigarettes  ?  Quit date: 05/25/2004  ?  Years since quitting: 17.3  ? Smokeless tobacco: Never  ?Substance Use Topics  ? Alcohol use: No  ?  Alcohol/week: 0.0 standard drinks  ? ? ? ?Current Outpatient Medications:  ?  ANORO ELLIPTA 62.5-25 MCG/ACT AEPB, INHALE 1 PUFF INTO THE LUNGS DAILY, Disp: 60 each, Rfl: 0 ?  cholecalciferol (VITAMIN D) 1000 UNITS tablet, Take 2,000 Units by mouth daily., Disp: , Rfl:  ?  DULoxetine (CYMBALTA) 60 MG capsule, TAKE 1 CAPSULE(60 MG) BY MOUTH DAILY, Disp: 90 capsule, Rfl: 1 ?  Ipratropium-Albuterol (COMBIVENT RESPIMAT) 20-100 MCG/ACT AERS respimat, Inhale 1 puff into the lungs every 6 (six) hours as needed for wheezing., Disp: 12 g, Rfl: 1 ?  levothyroxine (SYNTHROID) 25 MCG tablet, TAKE 2 TABLETS BY MOUTH 4 DAYS A WEEK AND 1 TABLET 3 DAYS A WEEK. TAKE DAILY BEFORE BREAKFAST, Disp: 150 tablet, Rfl: 1 ?  lisdexamfetamine (VYVANSE) 30 MG capsule, Take 1 capsule (30 mg total) by mouth daily., Disp: 90 capsule, Rfl: 0 ?  omeprazole (PRILOSEC) 40 MG capsule, Take 1 capsule (40 mg  total) by mouth daily., Disp: 90 capsule, Rfl: 1 ?  SUMAtriptan (IMITREX) 100 MG tablet, Take 1 tablet (100 mg total) by mouth as needed for migraine. May repeat in 2 hours if headache persists or recurs., Disp: 18 tablet, Rfl: 1 ?  topiramate (TOPAMAX) 100 MG tablet, Take 1 tablet (100 mg total) by mouth 2 (two) times daily., Disp: 180 tablet, Rfl: 1 ?  traMADol (ULTRAM) 50 MG tablet, Take 1 tablet by mouth 2 (two) times daily as needed., Disp: , Rfl:  ?  Ubrogepant (UBRELVY) 100 MG TABS, Take 1 tablet by mouth daily as needed., Disp: 6 tablet, Rfl:  2 ?  valACYclovir (VALTREX) 500 MG tablet, TAKE 2 TABLETS BY MOUTH TWICE DAILY FOR 10 DAYS, Disp: 20 tablet, Rfl: 0 ? ?Allergies  ?Allergen Reactions  ? Codeine   ? Pantoprazole   ?  Flu like syptoms  ? ? ?I personally reviewed active problem list, medication list, allergies, family history, social history, health maintenance with the patient/caregiver today. ? ? ?ROS ? ?Constitutional: Negative for fever or weight change.  ?Respiratory: positive for cough and shortness of breath.   ?Cardiovascular: Negative for chest pain or palpitations.  ?Gastrointestinal: Negative for abdominal pain, no bowel changes.  ?Musculoskeletal: Negative for gait problem or joint swelling.  ?Skin: Negative for rash.  ?Neurological: Negative for dizziness , positive for intermittent  headache.  ?No other specific complaints in a complete review of systems (except as listed in HPI above).  ? ?Objective ? ?Vitals:  ? 09/17/21 1358  ?BP: 132/82  ?Pulse: 97  ?Resp: 16  ?Temp: 97.7 ?F (36.5 ?C)  ?TempSrc: Oral  ?SpO2: 100%  ?Weight: 165 lb 14.4 oz (75.3 kg)  ?Height: 5\' 3"  (1.6 m)  ? ? ?Body mass index is 29.39 kg/m?. ? ?Physical Exam ? ?Constitutional: Patient appears well-developed and well-nourished. No distress.  ?HEENT: head atraumatic, normocephalic, pupils equal and reactive to light, neck supple ?Cardiovascular: Normal rate, regular rhythm and normal heart sounds.  No murmur heard. No BLE  edema. ?Pulmonary/Chest: Effort normal and breath sounds normal. No respiratory distress. ?Abdominal: Soft.  There is no tenderness. ?Psychiatric: Patient has a normal mood and affect. behavior is normal. J

## 2021-09-17 ENCOUNTER — Encounter: Payer: Self-pay | Admitting: Family Medicine

## 2021-09-17 ENCOUNTER — Other Ambulatory Visit: Payer: Self-pay | Admitting: Family Medicine

## 2021-09-17 ENCOUNTER — Ambulatory Visit: Payer: 59 | Admitting: Family Medicine

## 2021-09-17 VITALS — BP 132/82 | HR 97 | Temp 97.7°F | Resp 16 | Ht 63.0 in | Wt 165.9 lb

## 2021-09-17 DIAGNOSIS — N1831 Chronic kidney disease, stage 3a: Secondary | ICD-10-CM | POA: Diagnosis not present

## 2021-09-17 DIAGNOSIS — J449 Chronic obstructive pulmonary disease, unspecified: Secondary | ICD-10-CM

## 2021-09-17 DIAGNOSIS — I700 Atherosclerosis of aorta: Secondary | ICD-10-CM | POA: Diagnosis not present

## 2021-09-17 DIAGNOSIS — F321 Major depressive disorder, single episode, moderate: Secondary | ICD-10-CM

## 2021-09-17 DIAGNOSIS — Z23 Encounter for immunization: Secondary | ICD-10-CM

## 2021-09-17 DIAGNOSIS — F900 Attention-deficit hyperactivity disorder, predominantly inattentive type: Secondary | ICD-10-CM

## 2021-09-17 DIAGNOSIS — Z1231 Encounter for screening mammogram for malignant neoplasm of breast: Secondary | ICD-10-CM

## 2021-09-17 DIAGNOSIS — G43009 Migraine without aura, not intractable, without status migrainosus: Secondary | ICD-10-CM

## 2021-09-17 MED ORDER — AMPHETAMINE-DEXTROAMPHETAMINE 20 MG PO TABS: 20.0000 mg | ORAL_TABLET | Freq: Two times a day (BID) | ORAL | 0 refills | Status: DC

## 2021-09-17 MED ORDER — UBRELVY 100 MG PO TABS: 1.0000 | ORAL_TABLET | Freq: Every day | ORAL | 2 refills | Status: DC | PRN

## 2021-09-17 MED ORDER — ANORO ELLIPTA 62.5-25 MCG/ACT IN AEPB: 1.0000 | INHALATION_SPRAY | Freq: Every day | RESPIRATORY_TRACT | 1 refills | Status: DC

## 2021-09-17 MED ORDER — TOPIRAMATE 100 MG PO TABS: 100.0000 mg | ORAL_TABLET | Freq: Two times a day (BID) | ORAL | 1 refills | Status: DC

## 2021-10-09 ENCOUNTER — Other Ambulatory Visit: Payer: Self-pay | Admitting: Family Medicine

## 2021-10-09 DIAGNOSIS — Z8619 Personal history of other infectious and parasitic diseases: Secondary | ICD-10-CM

## 2021-10-10 MED ORDER — VALACYCLOVIR HCL 500 MG PO TABS: ORAL_TABLET | ORAL | 0 refills | Status: DC

## 2021-12-18 NOTE — Progress Notes (Signed)
Name: Jane Patrick   MRN: [id number]    DOB: [birth year]   Date:12/18/2021       Progress Note  Subjective  Chief Complaint  Follow Up  I connected with  Jane Patrick  on 12/18/21 at  8:20 AM EDT by a video enabled telemedicine application and verified that I am speaking with the correct person using two identifiers.  I discussed the limitations of evaluation and management by telemedicine and the availability of in person appointments. The patient expressed understanding and agreed to proceed with the virtual visit  Staff also discussed with the patient that there may be a patient responsible charge related to this service. Patient Location: at mother - in -laws house  Provider Location: [facility] Additional Individuals present: alone   HPI    ADD she has been taking Vyvanse since Fall 2021 she states it helps her stay alert all day, no longer crashing at the end of the day, she was on Adderal previously but did not last all day however since she has been taking care of her "MIL" her schedule is sporadic, sometimes only works for 4 hours and sometimes sleeps in due to staying up all night, she states taking Adderal one and a half a day has been working well for her. No side effects.      Patient Active Problem List   Diagnosis Date Noted   Esophageal dysphagia    Adult hypothyroidism 08/15/2017   Atherosclerosis of aorta (HCC) 09/22/2016   Hiatal hernia 09/22/2016   Abnormal liver CT 09/11/2016   H/O herpes simplex type 2 infection 08/20/2015   Hematuria 02/19/2015   B12 deficiency 02/19/2015   Attention deficit hyperactivity disorder (ADHD), predominantly inattentive type 11/13/2014   COPD, mild (HCC) 11/13/2014   Major depression in partial remission (HCC) 11/13/2014   History of basal cell carcinoma 11/13/2014   Gastroesophageal reflux disease without esophagitis 11/13/2014   History of abnormal cervical Pap smear 11/13/2014   Migraine without aura and without status  migrainosus, not intractable 11/13/2014   Vitamin D deficiency 11/13/2014   Liver lesion 10/04/2010    Past Surgical History:  Procedure Laterality Date   ABDOMINAL HYSTERECTOMY  2004   partial   BREAST SURGERY     biopsies   ESOPHAGOGASTRODUODENOSCOPY (EGD) WITH PROPOFOL N/A 08/02/2018   Procedure: ESOPHAGOGASTRODUODENOSCOPY (EGD) WITH PROPOFOL;  Surgeon: [name], MD;  Location: [facility] ENDOSCOPY;  Service: Gastroenterology;  Laterality: N/A;   FRACTURE SURGERY     rode in femur   POLYPECTOMY     vocal chord polyps    Family History  Problem Relation Age of Onset   Colon cancer Father    Breast cancer Sister    Diverticulitis Brother    Diverticulitis Brother     Social History   Socioeconomic History   Marital status: Media planner    Spouse name: Not on file   Number of children: 2   Years of education: Not on file   Highest education level: Associate degree: occupational, Scientist, product/process development, or vocational program  Occupational History   Occupation: Doctor, general practice   Tobacco Use   Smoking status: Former    Packs/day: 1.00    Years: 25.00    Total pack years: 25.00    Types: Cigarettes    Quit date: 05/25/2004    Years since quitting: 17.5   Smokeless tobacco: Never  Vaping Use   Vaping Use: Never used  Substance and Sexual Activity   Alcohol use: No  Alcohol/week: 0.0 standard drinks of alcohol   Drug use: No   Sexual activity: Yes    Partners: Male  Other Topics Concern   Not on file  Social History Narrative   Patient was laid off from May till Nov 2019, but is working again . Works from home for a [employer]    She is engaged    Social Determinants of Corporate investment banker Strain: Low Risk  (12/10/2017)   Overall Financial Resource Strain (CARDIA)    Difficulty of Paying Living Expenses: Not hard at all  Food Insecurity: No Food Insecurity (12/10/2017)   Hunger Vital Sign    Worried About Running Out of Food in the Last Year:  Never true    Ran Out of Food in the Last Year: Never true  Transportation Needs: No Transportation Needs (12/10/2017)   PRAPARE - Administrator, Civil Service (Medical): No    Lack of Transportation (Non-Medical): No  Physical Activity: Sufficiently Active (12/10/2017)   Exercise Vital Sign    Days of Exercise per Week: 7 days    Minutes of Exercise per Session: 30 min  Stress: No Stress Concern Present (12/10/2017)   [employer] of Occupational Health - Occupational Stress Questionnaire    Feeling of Stress : Not at all  Social Connections: Somewhat Isolated (12/10/2017)   Social Connection and Isolation Panel [NHANES]    Frequency of Communication with Friends and Family: More than three times a week    Frequency of Social Gatherings with Friends and Family: More than three times a week    Attends Religious Services: Never    Database administrator or Organizations: Yes    Attends Engineer, structural: More than 4 times per year    Marital Status: Widowed  Intimate Partner Violence: Not At Risk (12/10/2017)   Humiliation, Afraid, Rape, and Kick questionnaire    Fear of Current or Ex-Partner: No    Emotionally Abused: No    Physically Abused: No    Sexually Abused: No     Current Outpatient Medications:    amphetamine-dextroamphetamine (ADDERALL) 20 MG tablet, Take 1 tablet (20 mg total) by mouth 2 (two) times daily. One in am and half in pm, Disp: 45 tablet, Rfl: 0   amphetamine-dextroamphetamine (ADDERALL) 20 MG tablet, Take 1 tablet (20 mg total) by mouth 2 (two) times daily. One in am and half in pm, Disp: 45 tablet, Rfl: 0   amphetamine-dextroamphetamine (ADDERALL) 20 MG tablet, Take 1 tablet (20 mg total) by mouth 2 (two) times daily. One in am and half in pm, Disp: 45 tablet, Rfl: 0   ANORO ELLIPTA 62.5-25 MCG/ACT AEPB, Inhale 1 puff into the lungs daily., Disp: 180 each, Rfl: 1   cholecalciferol (VITAMIN D) 1000 UNITS tablet, Take 2,000 Units by  mouth daily., Disp: , Rfl:    DULoxetine (CYMBALTA) 60 MG capsule, TAKE 1 CAPSULE(60 MG) BY MOUTH DAILY, Disp: 90 capsule, Rfl: 1   Ipratropium-Albuterol (COMBIVENT RESPIMAT) 20-100 MCG/ACT AERS respimat, Inhale 1 puff into the lungs every 6 (six) hours as needed for wheezing., Disp: 12 g, Rfl: 1   levothyroxine (SYNTHROID) 25 MCG tablet, TAKE 2 TABLETS BY MOUTH 4 DAYS A WEEK AND 1 TABLET 3 DAYS A WEEK. TAKE DAILY BEFORE BREAKFAST, Disp: 150 tablet, Rfl: 1   omeprazole (PRILOSEC) 40 MG capsule, Take 1 capsule (40 mg total) by mouth daily., Disp: 90 capsule, Rfl: 1   SUMAtriptan (IMITREX) 100 MG tablet, Take  1 tablet (100 mg total) by mouth as needed for migraine. May repeat in 2 hours if headache persists or recurs., Disp: 18 tablet, Rfl: 1   topiramate (TOPAMAX) 100 MG tablet, Take 1 tablet (100 mg total) by mouth 2 (two) times daily., Disp: 180 tablet, Rfl: 1   traMADol (ULTRAM) 50 MG tablet, Take 1 tablet by mouth 2 (two) times daily as needed., Disp: , Rfl:    Ubrogepant (UBRELVY) 100 MG TABS, Take 1 tablet by mouth daily as needed., Disp: 16 tablet, Rfl: 2   valACYclovir (VALTREX) 500 MG tablet, TAKE 2 TABLETS BY MOUTH TWICE DAILY FOR 10 DAYS, Disp: 20 tablet, Rfl: 0  Allergies  Allergen Reactions   Codeine    Pantoprazole     Flu like syptoms    I personally reviewed active problem list, medication list, allergies, family history, social history, health maintenance with the patient/caregiver today.   ROS  Ten systems reviewed and is negative except as mentioned in HPI   Objective  Virtual encounter, vitals not obtained.  There is no height or weight on file to calculate BMI.  Physical Exam  Awake, alert and oriented  PHQ2/9:    09/17/2021    2:03 PM 05/06/2021   11:14 AM 02/04/2021    8:37 AM 11/12/2020    1:44 PM 07/30/2020    7:43 AM  Depression screen PHQ 2/9  Decreased Interest 1 1 2 1 2   Down, Depressed, Hopeless 2 0 2 1 2   PHQ - 2 Score 3 1 4 2 4   Altered  sleeping 3 3 0 3 3  Tired, decreased energy 3 3 3 3 3   Change in appetite 3 0 0 0 0  Feeling bad or failure about yourself  0 0 0 0 0  Trouble concentrating 3 0 3 0 3  Moving slowly or fidgety/restless 0 0 0 0 0  Suicidal thoughts 0 0 0 0 0  PHQ-9 Score 15 7 10 8 13   Difficult doing work/chores Somewhat difficult Somewhat difficult      PHQ-2/9 Result is positive.    Fall Risk:    09/17/2021    1:59 PM 05/06/2021   11:14 AM 02/04/2021    8:36 AM 11/12/2020    1:44 PM 07/30/2020    7:43 AM  Fall Risk   Falls in the past year? 0 0 0 0 0  Number falls in past yr:  0 0 0 0  Injury with Fall?  0 0 0 0  Risk for fall due to : No Fall Risks No Fall Risks No Fall Risks    Follow up Falls prevention discussed Falls prevention discussed Falls prevention discussed       Assessment & Plan  1. Attention deficit hyperactivity disorder (ADHD), predominantly inattentive type  - amphetamine-dextroamphetamine (ADDERALL) 20 MG tablet; Take 1 tablet (20 mg total) by mouth 2 (two) times daily. One in am and half in pm  Dispense: 45 tablet; Refill: 0 - amphetamine-dextroamphetamine (ADDERALL) 20 MG tablet; Take 1 tablet (20 mg total) by mouth 2 (two) times daily. One in am and half in pm  Dispense: 45 tablet; Refill: 0 - amphetamine-dextroamphetamine (ADDERALL) 20 MG tablet; Take 1 tablet (20 mg total) by mouth 2 (two) times daily. One in am and half in pm  Dispense: 45 tablet; Refill: 0   I discussed the assessment and treatment plan with the patient. The patient was provided an opportunity to ask questions and all were answered. The patient agreed with  the plan and demonstrated an understanding of the instructions.  The patient was advised to call back or seek an in-person evaluation if the symptoms worsen or if the condition fails to improve as anticipated.  I provided 15  minutes of non-face-to-face time during this encounter.

## 2021-12-19 ENCOUNTER — Telehealth (INDEPENDENT_AMBULATORY_CARE_PROVIDER_SITE_OTHER): Payer: 59 | Admitting: Family Medicine

## 2021-12-19 ENCOUNTER — Encounter: Payer: Self-pay | Admitting: Family Medicine

## 2021-12-19 DIAGNOSIS — F900 Attention-deficit hyperactivity disorder, predominantly inattentive type: Secondary | ICD-10-CM

## 2021-12-19 MED ORDER — AMPHETAMINE-DEXTROAMPHETAMINE 20 MG PO TABS: 20.0000 mg | ORAL_TABLET | Freq: Two times a day (BID) | ORAL | 0 refills | Status: DC

## 2022-01-28 ENCOUNTER — Encounter: Payer: Self-pay | Admitting: Family Medicine

## 2022-01-29 ENCOUNTER — Other Ambulatory Visit: Payer: Self-pay | Admitting: Family Medicine

## 2022-01-29 DIAGNOSIS — F900 Attention-deficit hyperactivity disorder, predominantly inattentive type: Secondary | ICD-10-CM

## 2022-01-29 MED ORDER — AMPHETAMINE-DEXTROAMPHETAMINE 10 MG PO TABS: 10.0000 mg | ORAL_TABLET | Freq: Two times a day (BID) | ORAL | 0 refills | Status: DC

## 2022-02-10 ENCOUNTER — Other Ambulatory Visit: Payer: Self-pay | Admitting: Family Medicine

## 2022-02-10 DIAGNOSIS — E039 Hypothyroidism, unspecified: Secondary | ICD-10-CM

## 2022-02-10 NOTE — Telephone Encounter (Signed)
Last seen 7/28

## 2022-02-13 LAB — HM MAMMOGRAPHY: HM Mammogram: ABNORMAL — AB (ref 0–4)

## 2022-02-25 ENCOUNTER — Other Ambulatory Visit: Payer: Self-pay | Admitting: Family Medicine

## 2022-02-25 DIAGNOSIS — Z9889 Other specified postprocedural states: Secondary | ICD-10-CM

## 2022-02-25 DIAGNOSIS — E039 Hypothyroidism, unspecified: Secondary | ICD-10-CM

## 2022-02-25 DIAGNOSIS — F321 Major depressive disorder, single episode, moderate: Secondary | ICD-10-CM

## 2022-02-25 DIAGNOSIS — K219 Gastro-esophageal reflux disease without esophagitis: Secondary | ICD-10-CM

## 2022-03-13 ENCOUNTER — Telehealth: Payer: Self-pay | Admitting: Family Medicine

## 2022-03-13 DIAGNOSIS — J449 Chronic obstructive pulmonary disease, unspecified: Secondary | ICD-10-CM

## 2022-03-16 LAB — HM MAMMOGRAPHY
HM Mammogram: ABNORMAL — AB (ref 0–4)
HM Mammogram: ABNORMAL — AB (ref 0–4)

## 2022-03-19 NOTE — Telephone Encounter (Signed)
Pt checking status of refill request  Pt states she prefers combivent the rescue inhaler  Please fu w/ pt regarding the refill request being denied

## 2022-03-20 ENCOUNTER — Other Ambulatory Visit: Payer: Self-pay

## 2022-03-20 ENCOUNTER — Other Ambulatory Visit: Payer: Self-pay | Admitting: Family Medicine

## 2022-03-20 DIAGNOSIS — J449 Chronic obstructive pulmonary disease, unspecified: Secondary | ICD-10-CM

## 2022-03-20 MED ORDER — COMBIVENT RESPIMAT 20-100 MCG/ACT IN AERS: 1.0000 | INHALATION_SPRAY | Freq: Four times a day (QID) | RESPIRATORY_TRACT | 1 refills | Status: DC | PRN

## 2022-03-25 NOTE — Progress Notes (Signed)
Name: Jane Patrick   MRN: [id number]    DOB: 1960-10-08   Date:03/26/2022       Progress Note  Subjective  Chief Complaint  Follow up   HPI  SOB with activity: started a couple of months ago, no problems while at rest, but has noticed SOB even at the end of her shower, casual walking, she has tried home neb therapy without help. She denies chest pain but feels hot when it happens, no nausea or vomiting associated with episode She states grandmother died at age 98 from a heart attack.   ADD she has been taking Vyvanse since Fall 2021 she states it helps her stay alert all day, no longer crashing at the end of the day, she was on Adderal previously but did not last all day, she is currently taking 2 pills in am and one in pm. MIL died but her sleep is still disrupted , has not adjusted with normal sleep scheduled   Chronic back pain:  she is seeing [name] and had MRI done 03/30/2019, and showed L2- 3  Disc bulge , and also a hypointense bone lesion, repeat Feb 2021 showed stability . She continues to have daily pain described as aching like and average 5/10 , she is taking Tramadol prn .    Abnormal mammogram left: went to [facility] and is going back on Nov 9 th for stereotatic biopsy of left breast.   Migraine headache: she is on Topamax for prevention,  usually triggered by barometric pressure changes. TShe was taking Ubrelvy but not able to fill it lately, prefers Ubrelvy instead of Imitrex due to side effects of Imitrex. Episodes are about 2-  3 times a month and can last a couple of days per episode. Throbbing and tightness associated with photophobia occasionally nausea    Herpes Type II: she had a small lesion when she was seen by [name] and culture was positive for herpes, she was given Valtrex, no other outbreaks since. Uchanged    COPD Mild: Last flare was Dec 2019 took Z[employer] and [employer]. No longer smoking - quit about 12 years ago. She uses Anoro daily , she uses combivent  prn usually 5 times a week. Discussed referral to pulmonologist due to increase in SOB but she prefers seeing cardiologist first and see pulmonologist if cleared by cardiologist    B12 deficiency: found in June 2016, she needs to resume supplementation    Major Depression in partial Remission: on Pristiq and off AlprazolamSymptoms started when husband was diagnosed with lung cancer and died in 05-Jun-2008. Currently  Duloxetine, no side effects. Worse symptoms if fatigue    Hypothyroidism: she is taking levothyroxine, no hair loss, no change in bowel movements She continues to feel tired, we will recheck TSH today     CKI stage III denies pruritis or decrease in urine output, bp towards high end of normal, we will continue to monitor and consider adding ace. Discussed importance of avoiding NSAID's again. We will recheck levels today    Atherosclerosis of aorta: explained that is a risk of heart disease but she is still not ready to start statin therapy, would like to wait to see see cardiologist, she is taking aspirin 81 mg daily    Dysphagia/GERD: seen by Dr. Allegra Lai and had dilation done 08/2018 and is doing well since No dysphagia . She is taking PPI and symptoms are controlled  Unchanged   Patient Active Problem List   Diagnosis Date  Noted   Esophageal dysphagia    Adult hypothyroidism 08/15/2017   Atherosclerosis of aorta (HCC) 09/22/2016   Hiatal hernia 09/22/2016   Abnormal liver CT 09/11/2016   H/O herpes simplex type 2 infection 08/20/2015   Hematuria 02/19/2015   B12 deficiency 02/19/2015   Attention deficit hyperactivity disorder (ADHD), predominantly inattentive type 11/13/2014   COPD, mild (HCC) 11/13/2014   Major depression in partial remission (HCC) 11/13/2014   History of basal cell carcinoma 11/13/2014   Gastroesophageal reflux disease without esophagitis 11/13/2014   History of abnormal cervical Pap smear 11/13/2014   Migraine without aura and without status migrainosus, not  intractable 11/13/2014   Vitamin D deficiency 11/13/2014   Liver lesion 10/04/2010    Past Surgical History:  Procedure Laterality Date   ABDOMINAL HYSTERECTOMY  2004   partial   BREAST SURGERY     biopsies   ESOPHAGOGASTRODUODENOSCOPY (EGD) WITH PROPOFOL N/A 08/02/2018   Procedure: ESOPHAGOGASTRODUODENOSCOPY (EGD) WITH PROPOFOL;  Surgeon: [name], MD;  Location: [facility] ENDOSCOPY;  Service: Gastroenterology;  Laterality: N/A;   FRACTURE SURGERY     rode in femur   POLYPECTOMY     vocal chord polyps    Family History  Problem Relation Age of Onset   Colon cancer Father    Breast cancer Sister    Diverticulitis Brother    Diverticulitis Brother     Social History   Tobacco Use   Smoking status: Former    Packs/day: 1.00    Years: 60.00    Total pack years: 31.00    Types: Cigarettes    Start date: 05/30/1975    Quit date: 05/25/2004    Years since quitting: 60.8   Smokeless tobacco: Former  Substance Use Topics   Alcohol use: No    Alcohol/week: 0.0 standard drinks of alcohol     Current Outpatient Medications:    ANORO ELLIPTA 62.5-25 MCG/ACT AEPB, Inhale 1 puff into the lungs daily., Disp: 180 each, Rfl: 1   cholecalciferol (VITAMIN D) 1000 UNITS tablet, Take 2,000 Units by mouth daily., Disp: , Rfl:    DULoxetine (CYMBALTA) 60 MG capsule, TAKE 1 CAPSULE(60 MG) BY MOUTH DAILY, Disp: 90 capsule, Rfl: 1   Ipratropium-Albuterol (COMBIVENT RESPIMAT) 20-100 MCG/ACT AERS respimat, Inhale 1 puff into the lungs every 6 (six) hours as needed for wheezing., Disp: 12 g, Rfl: 1   levothyroxine (SYNTHROID) 25 MCG tablet, TAKE 2 TABLETS BY MOUTH 4 DAYS A WEEK, AND 1 TABLET FOR 3 DAYS A WEEK. TAKE DAILY BEFORE BREAKFAST., Disp: 150 tablet, Rfl: 0   omeprazole (PRILOSEC) 40 MG capsule, TAKE 1 CAPSULE(40 MG) BY MOUTH DAILY, Disp: 90 capsule, Rfl: 1   SUMAtriptan (IMITREX) 100 MG tablet, Take 1 tablet (100 mg total) by mouth as needed for migraine. May repeat in 2 hours if  headache persists or recurs., Disp: 18 tablet, Rfl: 1   topiramate (TOPAMAX) 100 MG tablet, Take 1 tablet (100 mg total) by mouth 2 (two) times daily., Disp: 180 tablet, Rfl: 1   traMADol (ULTRAM) 50 MG tablet, Take 1 tablet by mouth 2 (two) times daily as needed., Disp: , Rfl:    Ubrogepant (UBRELVY) 100 MG TABS, Take 1 tablet by mouth daily as needed., Disp: 16 tablet, Rfl: 2   valACYclovir (VALTREX) 500 MG tablet, TAKE 2 TABLETS BY MOUTH TWICE DAILY FOR 10 DAYS, Disp: 20 tablet, Rfl: 0   amphetamine-dextroamphetamine (ADDERALL) 10 MG tablet, Take 1-2 tablets (10-20 mg total) by mouth 2 (two) times daily. 2  in am and 1 in pm, Disp: 90 tablet, Rfl: 0  Allergies  Allergen Reactions   Codeine    Pantoprazole     Flu like syptoms    I personally reviewed active problem list, medication list, allergies, family history, social history with the patient/caregiver today.   ROS  Constitutional: Negative for fever or weight change.  Respiratory: Negative for cough and shortness of breath.   Cardiovascular: Negative for chest pain or palpitations.  Gastrointestinal: Negative for abdominal pain, no bowel changes.  Musculoskeletal: Negative for gait problem or joint swelling.  Skin: Negative for rash.  Neurological: Negative for dizziness or headache.  No other specific complaints in a complete review of systems (except as listed in HPI above).   Objective  Vitals:   03/26/22 0915  BP: 128/76  Pulse: 100  Resp: 16  Temp: 98 F (36.7 C)  TempSrc: Oral  SpO2: 96%  Weight: 166 lb (75.3 kg)  Height: 5\' 3"  (1.6 m)    Body mass index is 29.41 kg/m.  Physical Exam  Constitutional: Patient appears well-developed and well-nourished.  No distress.  HEENT: head atraumatic, normocephalic, pupils equal and reactive to light, neck supple Cardiovascular: Normal rate, regular rhythm and normal heart sounds.  No murmur heard. No BLE edema. Pulmonary/Chest: Effort normal and breath sounds  normal. No respiratory distress. Abdominal: Soft.  There is no tenderness. Psychiatric: Patient has a normal mood and affect. behavior is normal. Judgment and thought content normal.   Recent Results (from the past 2160 hour(s))  [facility]AMMOGRAPHY     Status: Abnormal   Collection Time: 02/13/22 12:00 AM  Result Value Ref Range   [facility] Mammogram Self Reported Abnormal (A) 0-4 Bi-Rad, Self Reported Normal    Comment: asymmetry in the medial left breast at middle depth  [facility] MAMMOGRAPHY     Status: Abnormal   Collection Time: 03/16/22 12:00 AM  Result Value Ref Range   HM Mammogram Self Reported Abnormal (A) 0-4 Bi-Rad, Self Reported Normal  [facility] MAMMOGRAPHY     Status: Abnormal   Collection Time: 03/16/22 12:00 AM  Result Value Ref Range   [facility] Mammogram Self Reported Abnormal (A) 0-4 Bi-Rad, Self Reported Normal    Comment: Suspicious complicated cyst versus hypoechoic mass in the left breast at 7:00-Diagnostic mammogram      PHQ2/9:    03/26/2022    9:11 AM 12/19/2021    8:01 AM 09/17/2021    2:03 PM 05/06/2021   11:14 AM 02/04/2021    8:37 AM  Depression screen PHQ 2/9  Decreased Interest 0 0 1 1 2   Down, Depressed, Hopeless 0 0 2 0 2  PHQ - 2 Score 0 0 3 1 4   Altered sleeping 0 0 3 3 0  Tired, decreased energy 0 0 3 3 3   Change in appetite 0 0 3 0 0  Feeling bad or failure about yourself  0 0 0 0 0  Trouble concentrating 0 0 3 0 3  Moving slowly or fidgety/restless 0 0 0 0 0  Suicidal thoughts 0 0 0 0 0  PHQ-9 Score 0 0 15 7 10   Difficult doing work/chores Not difficult at all Not difficult at all Somewhat difficult Somewhat difficult     phq 9 is negative   Fall Risk:    03/26/2022    9:11 AM 12/19/2021    8:01 AM 09/17/2021    1:59 PM 05/06/2021   11:14 AM 02/04/2021    8:36 AM  Fall Risk  Falls in the past year? 0 0 0 0 0  Number falls in past yr: 0 0  0 0  Injury with Fall? 0 0  0 0  Risk for fall due to : No Fall Risks No Fall Risks No Fall Risks No Fall Risks No Fall  Risks  Follow up Education provided;Falls evaluation completed;Falls prevention discussed Falls prevention discussed;Education provided Falls prevention discussed Falls prevention discussed Falls prevention discussed     Functional Status Survey: Is the patient deaf or have difficulty hearing?: No Does the patient have difficulty seeing, even when wearing glasses/contacts?: No Does the patient have difficulty concentrating, remembering, or making decisions?: Yes Does the patient have difficulty walking or climbing stairs?: No Does the patient have difficulty dressing or bathing?: No Does the patient have difficulty doing errands alone such as visiting a doctor's office or shopping?: No    Assessment & Plan  1. COPD, mild (HCC)   2. Stage 3a chronic kidney disease (HCC)  - VITAMIN D 25 Hydroxy (Vit-D Deficiency, Fractures) - COMPLETE METABOLIC PANEL WITH GFR - CBC with Differential/Platelet  3. Attention deficit hyperactivity disorder (ADHD), predominantly inattentive type  - amphetamine-dextroamphetamine (ADDERALL) 10 MG tablet; Take 1-2 tablets (10-20 mg total) by mouth 2 (two) times daily. 2 in am and 1 in pm  Dispense: 90 tablet; Refill: 0  4. Moderate major depression (HCC)   5. Atherosclerosis of aorta (HCC)  - Lipid panel  6. SOB (shortness of breath) on exertion  - Ambulatory referral to Cardiology - CBC with Differential/Platelet - CRP High sensitivity  7. Need for immunization against influenza  Today   8. B12 deficiency  - Vitamin B12  9. Vitamin D deficiency   10. GERD without esophagitis   11. Hypothyroidism in adult  - TSH  12. Migraine without aura and without status migrainosus, not intractable   13. Dyslipidemia   14. Hyperglycemia  - Hemoglobin A1c  15. Abnormal mammogram of left breast

## 2022-03-26 ENCOUNTER — Encounter: Payer: Self-pay | Admitting: Family Medicine

## 2022-03-26 ENCOUNTER — Ambulatory Visit: Payer: 59 | Admitting: Family Medicine

## 2022-03-26 VITALS — BP 128/76 | HR 100 | Temp 98.0°F | Resp 16 | Ht 63.0 in | Wt 166.0 lb

## 2022-03-26 DIAGNOSIS — J449 Chronic obstructive pulmonary disease, unspecified: Secondary | ICD-10-CM

## 2022-03-26 DIAGNOSIS — E785 Hyperlipidemia, unspecified: Secondary | ICD-10-CM

## 2022-03-26 DIAGNOSIS — Z23 Encounter for immunization: Secondary | ICD-10-CM

## 2022-03-26 DIAGNOSIS — N1831 Chronic kidney disease, stage 3a: Secondary | ICD-10-CM

## 2022-03-26 DIAGNOSIS — F900 Attention-deficit hyperactivity disorder, predominantly inattentive type: Secondary | ICD-10-CM

## 2022-03-26 DIAGNOSIS — R739 Hyperglycemia, unspecified: Secondary | ICD-10-CM

## 2022-03-26 DIAGNOSIS — E538 Deficiency of other specified B group vitamins: Secondary | ICD-10-CM

## 2022-03-26 DIAGNOSIS — R928 Other abnormal and inconclusive findings on diagnostic imaging of breast: Secondary | ICD-10-CM

## 2022-03-26 DIAGNOSIS — G43009 Migraine without aura, not intractable, without status migrainosus: Secondary | ICD-10-CM

## 2022-03-26 DIAGNOSIS — E559 Vitamin D deficiency, unspecified: Secondary | ICD-10-CM

## 2022-03-26 DIAGNOSIS — F321 Major depressive disorder, single episode, moderate: Secondary | ICD-10-CM | POA: Diagnosis not present

## 2022-03-26 DIAGNOSIS — E039 Hypothyroidism, unspecified: Secondary | ICD-10-CM

## 2022-03-26 DIAGNOSIS — I700 Atherosclerosis of aorta: Secondary | ICD-10-CM

## 2022-03-26 DIAGNOSIS — K219 Gastro-esophageal reflux disease without esophagitis: Secondary | ICD-10-CM

## 2022-03-26 DIAGNOSIS — R0602 Shortness of breath: Secondary | ICD-10-CM

## 2022-03-26 MED ORDER — AMPHETAMINE-DEXTROAMPHETAMINE 10 MG PO TABS: 10.0000 mg | ORAL_TABLET | Freq: Two times a day (BID) | ORAL | 0 refills | Status: DC

## 2022-03-27 ENCOUNTER — Other Ambulatory Visit: Payer: Self-pay

## 2022-03-27 LAB — CBC WITH DIFFERENTIAL/PLATELET
Absolute Monocytes: 399 cells/uL (ref 200–950)
Basophils Absolute: 21 cells/uL (ref 0–200)
Basophils Relative: 0.5 %
Eosinophils Absolute: 118 cells/uL (ref 15–500)
Eosinophils Relative: 2.8 %
HCT: 43.5 % (ref 35.0–45.0)
Hemoglobin: 13.8 g/dL (ref 11.7–15.5)
Lymphs Abs: 1142 cells/uL (ref 850–3900)
MCH: 26.5 pg — ABNORMAL LOW (ref 27.0–33.0)
MCHC: 31.7 g/dL — ABNORMAL LOW (ref 32.0–36.0)
MCV: 83.5 fL (ref 80.0–100.0)
MPV: 9.7 fL (ref 7.5–12.5)
Monocytes Relative: 9.5 %
Neutro Abs: 2520 cells/uL (ref 1500–7800)
Neutrophils Relative %: 60 %
Platelets: 189 10*3/uL (ref 140–400)
RBC: 5.21 10*6/uL — ABNORMAL HIGH (ref 3.80–5.10)
RDW: 14.4 % (ref 11.0–15.0)
Total Lymphocyte: 27.2 %
WBC: 4.2 10*3/uL (ref 3.8–10.8)

## 2022-03-27 LAB — COMPLETE METABOLIC PANEL WITH GFR
AG Ratio: 1.8 (calc) (ref 1.0–2.5)
ALT: 12 U/L (ref 6–29)
AST: 10 U/L (ref 10–35)
Albumin: 4.4 g/dL (ref 3.6–5.1)
Alkaline phosphatase (APISO): 95 U/L (ref 37–153)
BUN: 20 mg/dL (ref 7–25)
CO2: 26 mmol/L (ref 20–32)
Calcium: 9.4 mg/dL (ref 8.6–10.4)
Chloride: 108 mmol/L (ref 98–110)
Creat: 0.98 mg/dL (ref 0.50–1.05)
Globulin: 2.5 g/dL (calc) (ref 1.9–3.7)
Glucose, Bld: 92 mg/dL (ref 65–99)
Potassium: 4.2 mmol/L (ref 3.5–5.3)
Sodium: 143 mmol/L (ref 135–146)
Total Bilirubin: 0.4 mg/dL (ref 0.2–1.2)
Total Protein: 6.9 g/dL (ref 6.1–8.1)
eGFR: 66 mL/min/{1.73_m2} (ref >=60)

## 2022-03-27 LAB — HEMOGLOBIN A1C
Hgb A1c MFr Bld: 6 % of total Hgb — ABNORMAL HIGH (ref <5.7)
Mean Plasma Glucose: 126 mg/dL
eAG (mmol/L): 7 mmol/L

## 2022-03-27 LAB — LIPID PANEL
Cholesterol: 229 mg/dL — ABNORMAL HIGH (ref <200)
HDL: 50 mg/dL (ref >=50)
LDL Cholesterol (Calc): 149 mg/dL (calc) — ABNORMAL HIGH
Non-HDL Cholesterol (Calc): 179 mg/dL (calc) — ABNORMAL HIGH (ref <130)
Total CHOL/HDL Ratio: 4.6 (calc) (ref <5.0)
Triglycerides: 161 mg/dL — ABNORMAL HIGH (ref <150)

## 2022-03-27 LAB — VITAMIN B12: Vitamin B-12: 363 pg/mL (ref 200–1100)

## 2022-03-27 LAB — HIGH SENSITIVITY CRP: hs-CRP: >10 mg/L — ABNORMAL HIGH

## 2022-03-27 LAB — VITAMIN D 25 HYDROXY (VIT D DEFICIENCY, FRACTURES): Vit D, 25-Hydroxy: 28 ng/mL — ABNORMAL LOW (ref 30–100)

## 2022-03-27 LAB — TSH: TSH: 3.22 mIU/L (ref 0.40–4.50)

## 2022-04-07 ENCOUNTER — Ambulatory Visit: Payer: 59 | Attending: Cardiology | Admitting: Cardiology

## 2022-04-07 ENCOUNTER — Encounter: Payer: Self-pay | Admitting: Cardiology

## 2022-04-07 VITALS — BP 148/78 | HR 93 | Ht 62.0 in | Wt 166.2 lb

## 2022-04-07 DIAGNOSIS — I700 Atherosclerosis of aorta: Secondary | ICD-10-CM | POA: Diagnosis not present

## 2022-04-07 DIAGNOSIS — E7849 Other hyperlipidemia: Secondary | ICD-10-CM | POA: Insufficient documentation

## 2022-04-07 DIAGNOSIS — R0609 Other forms of dyspnea: Secondary | ICD-10-CM | POA: Diagnosis not present

## 2022-04-07 DIAGNOSIS — R072 Precordial pain: Secondary | ICD-10-CM | POA: Diagnosis not present

## 2022-04-07 DIAGNOSIS — J449 Chronic obstructive pulmonary disease, unspecified: Secondary | ICD-10-CM

## 2022-04-07 MED ORDER — METOPROLOL TARTRATE 50 MG PO TABS: 100.0000 mg | ORAL_TABLET | Freq: Once | ORAL | 0 refills | Status: DC

## 2022-04-07 NOTE — Progress Notes (Signed)
Primary Care Provider: Alba Cory, MD  HeartCare Cardiologist: Bryan Lemma, MD Electrophysiologist: None  Clinic Note: Chief Complaint  Patient presents with   New Patient (Initial Visit)    Dyspnea - worse with any exertion; occasional chest tightness   ===================================  ASSESSMENT/PLAN   Problem List Items Addressed This Visit       Cardiology Problems   Hyperlipidemia due to dietary fat intake   Atherosclerosis of aorta (HCC) (Chronic)    Not unexpected in a 61 year old woman with hyperlipidemia and smoking history.  Baseline risk factor modification-needs lipid control as well as blood pressure control.  Thankfully she is no longer smoking.        Other   Dyspnea on minimal exertion - Primary    Pretty limiting dyspnea.  Currently I think she is somewhat deconditioned and has mild COPD, but this is relatively new progression of symptoms and now there is concern for some possible precordial pain.  Need to exclude cardiac etiology.  Plan: Coronary CTA and 2D echo.      Relevant Orders   EKG 12-Lead   Basic metabolic panel   CT CORONARY MORPH W/CTA COR W/SCORE W/CA W/CM &/OR WO/CM   ECHOCARDIOGRAM COMPLETE   Precordial pain    This is a new symptom that she had not mentioned to her PCP.  Precordial discomfort in association with profound exertional dyspnea is deafly concerning for CAD given her family history and her only history of smoking with poorly controlled lipids and hypertension.  Plan: Assess for CAD with coronary CT angiogram.-We will need pre-CT labs.      Relevant Orders   Basic metabolic panel   CT CORONARY MORPH W/CTA COR W/SCORE W/CA W/CM &/OR WO/CM   COPD, mild (HCC) (Chronic)    Clearly this is playing a role in her dyspnea, but it seems like there are progression of her dyspnea is more consistent with a different etiology.       ===================================  HPI:    Jane Patrick is an obese  61 y.o. female former smoker with PMH notable for mild COPD, ADHD and MDD, GERD/Esophageal Dysphagia, CKD 3A and history of aortic atherosclerosis, who is being seen today for the evaluation of Shortness of Breath on Exertion at the request of Alba Cory, MD.  Jane Patrick was just seen by Dr. Carlynn Purl on November 2.  She is complaining of worsening dyspnea on with activity that began a few months ago.  It does not bother her at rest.  She notes getting short of breath even at the end of the shower or casual walking.  Home neb therapy did not help.  No chest pain, nausea or lightheadedness.  Reported having a grandmother that died at age 65 of heart attack.  She was therefore referred for cardiology evaluation.    Recent Hospitalizations: None  Reviewed  CV studies:    The following studies were reviewed today: (if available, images/films reviewed: From Epic Chart or Care Everywhere) None:   Interval History:   Jane Patrick presents here today essentially with the same symptoms described above the visit with Dr. Carlynn Purl.  She says that she gets short of breath with just but any activity.  Doing laundry and any of her daytime cleaning activities.  She says that recently she is even started potentially noticing a little bit of tightness and pressure in her chest if she does not stop quickly. She also notes daytime sleepiness but says that she  just does not feel that she gets enough sleep.  She feels that she is sleepy during the day, but has not had issues of falling asleep during the day.  Just feels tired.  No associated chest tightness or pressure.  No heart failure symptoms or arrhythmia symptoms.  No syncope or near syncope.  CV Review of Symptoms (Summary) Cardiovascular ROS: no chest pain or dyspnea on exertion positive for - irregular heartbeat and fast HR when she can't breath.  negative for - edema, loss of consciousness, orthopnea, paroxysmal nocturnal dyspnea, or syncope/near  syncope, TIA/amaurosis fugax; claudication  REVIEWED OF SYSTEMS   Review of Systems  Constitutional:  Positive for malaise/fatigue (all the time - like can sleep all the time- no Energy). Negative for chills, fever and weight loss.       Sweats - ? Menopause/ flushes  Respiratory:  Positive for shortness of breath. Negative for cough and wheezing.   Cardiovascular:  Positive for chest pain (off & on tightness.) and palpitations (rare flip-flopping - not long). Negative for claudication.  Gastrointestinal:  Negative for abdominal pain, blood in stool, constipation, diarrhea, heartburn (but will have of not taking PPI) and melena.  Genitourinary:  Negative for dysuria, hematuria and urgency.  Musculoskeletal:  Positive for back pain (with radiculopathy), joint pain and myalgias.       Aches all over; leg hurt @ night  Neurological:  Positive for dizziness (lying down; standing too fast). Negative for sensory change, speech change, focal weakness and loss of consciousness.  Psychiatric/Behavioral:  Positive for memory loss. Negative for depression. The patient is not nervous/anxious and does not have insomnia (occasoinally uses Tylenol PM -- wakes up exhausted).     I have reviewed and (if needed) personally updated the patient's problem list, medications, allergies, past medical and surgical history, social and family history.   PAST MEDICAL HISTORY   Past Medical History:  Diagnosis Date   Allergy    Asthma    COPD (chronic obstructive pulmonary disease) (HCC)    Generalized headaches    GERD (gastroesophageal reflux disease)     PAST SURGICAL HISTORY   Past Surgical History:  Procedure Laterality Date   ABDOMINAL HYSTERECTOMY  2004   partial   BREAST SURGERY     biopsies   ESOPHAGOGASTRODUODENOSCOPY (EGD) WITH PROPOFOL N/A 08/02/2018   Procedure: ESOPHAGOGASTRODUODENOSCOPY (EGD) WITH PROPOFOL;  Surgeon: Toney ReilVanga, Rohini Reddy, MD;  Location: [facility] ENDOSCOPY;  Service:  Gastroenterology;  Laterality: N/A;   FRACTURE SURGERY     rode in femur   POLYPECTOMY     vocal chord polyps    Immunization History  Administered Date(s) Administered   Influenza,inj,Quad PF,6+ Mos 06/15/2016, 03/29/2017, 03/23/2018, 05/18/2019, 03/26/2022   PFIZER(Purple Top)SARS-COV-2 Vaccination 08/17/2019, 09/07/2019   Tdap 10/29/2009   Zoster Recombinat (Shingrix) 01/11/2020, 04/15/2020    MEDICATIONS/ALLERGIES   Current Meds  Medication Sig   amphetamine-dextroamphetamine (ADDERALL) 10 MG tablet Take 1-2 tablets (10-20 mg total) by mouth 2 (two) times daily. 2 in am and 1 in pm   ANORO ELLIPTA 62.5-25 MCG/ACT AEPB Inhale 1 puff into the lungs daily.   cholecalciferol (VITAMIN D) 1000 UNITS tablet Take 2,000 Units by mouth daily.   DULoxetine (CYMBALTA) 60 MG capsule TAKE 1 CAPSULE(60 MG) BY MOUTH DAILY   Ipratropium-Albuterol (COMBIVENT RESPIMAT) 20-100 MCG/ACT AERS respimat Inhale 1 puff into the lungs every 6 (six) hours as needed for wheezing.   levothyroxine (SYNTHROID) 25 MCG tablet TAKE 2 TABLETS BY MOUTH 4 DAYS A WEEK, AND  1 TABLET FOR 3 DAYS A WEEK. TAKE DAILY BEFORE BREAKFAST.   omeprazole (PRILOSEC) 40 MG capsule TAKE 1 CAPSULE(40 MG) BY MOUTH DAILY   SUMAtriptan (IMITREX) 100 MG tablet Take 1 tablet (100 mg total) by mouth as needed for migraine. May repeat in 2 hours if headache persists or recurs.   topiramate (TOPAMAX) 100 MG tablet Take 1 tablet (100 mg total) by mouth 2 (two) times daily.   traMADol (ULTRAM) 50 MG tablet Take 1 tablet by mouth 2 (two) times daily as needed.   Ubrogepant (UBRELVY) 100 MG TABS Take 1 tablet by mouth daily as needed.   valACYclovir (VALTREX) 500 MG tablet TAKE 2 TABLETS BY MOUTH TWICE DAILY FOR 10 DAYS    Allergies  Allergen Reactions   Codeine    Pantoprazole     Flu like syptoms    SOCIAL HISTORY/FAMILY HISTORY   Reviewed in Epic:   Social History   Tobacco Use   Smoking status: Former    Packs/day: 1.00     Years: 31.00    Total pack years: 31.00    Types: Cigarettes    Start date: 05/30/1975    Quit date: 05/25/2004    Years since quitting: 17.8   Smokeless tobacco: Former  Building services engineer Use: Never used  Substance Use Topics   Alcohol use: No    Alcohol/week: 0.0 standard drinks of alcohol   Drug use: No   Social History   Social History Narrative   Patient was laid off from May till Nov 2019, but is working again . 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) Still ~"Engaged"  Family History  Problem Relation Age of Onset   Colon cancer Father    Breast cancer Sister    Diverticulitis Brother    Diverticulitis Brother    Heart attack Maternal Grandmother 66   MGM- MI 6 - died  OBJCTIVE -PE, EKG, labs   Wt Readings from Last 3 Encounters:  04/07/22 166 lb 3.2 oz (75.4 kg)  03/26/22 166 lb (75.3 kg)  09/17/21 165 lb 14.4 oz (75.3 kg)    Physical Exam: BP (!) 148/78 (BP Location: Left Arm, Patient Position: Sitting, Cuff Size: Normal)   Pulse 93   Ht 5\' 2"  (1.575 m)   Wt 166 lb 3.2 oz (75.4 kg)   SpO2 96%   BMI 30.40 kg/m  Physical Exam Vitals (Repeat blood pressure was 146/78) reviewed.  Constitutional:      General: She is not in acute distress.    Appearance: Normal appearance. She is obese. She is not ill-appearing (She does have a mild chronic ill appearance to her.) or toxic-appearing.  HENT:     Head: Normocephalic and atraumatic.  Neck:     Vascular: No carotid bruit or JVD.  Cardiovascular:     Rate and Rhythm: Normal rate and regular rhythm. No extrasystoles are present.    Chest Wall: PMI is not displaced.     Pulses: Normal pulses and intact distal pulses.     Heart sounds: S1 normal and S2 normal. Heart sounds are distant. No murmur heard.    No friction rub. No gallop.  Pulmonary:     Effort: Pulmonary effort is normal. No respiratory distress.     Comments: Mildly diminished  breath sounds but no wheezes rales or rhonchi. Chest:     Chest wall: No tenderness.  Musculoskeletal:        General: No swelling.     Cervical back: Normal range of motion and neck supple.  Skin:    General: Skin is warm and dry.  Neurological:     General: No focal deficit present.     Mental Status: She is alert and oriented to person, place, and time.     Gait: Gait normal.  Psychiatric:        Mood and Affect: Mood normal.        Behavior: Behavior normal.        Thought Content: Thought content normal.        Judgment: Judgment normal.     Adult ECG Report  Rate: 93;  Rhythm: normal sinus rhythm; normal axis, intervals and durations.  Narrative Interpretation: Normal  Recent Labs: Reviewed Lab Results  Component Value Date   CHOL 229 (H) 03/26/2022   HDL 50 03/26/2022   LDLCALC 149 (H) 03/26/2022   TRIG 161 (H) 03/26/2022   CHOLHDL 4.6 03/26/2022   Lab Results  Component Value Date   CREATININE 0.98 03/26/2022   BUN 20 03/26/2022   NA 143 03/26/2022   K 4.2 03/26/2022   CL 108 03/26/2022   CO2 26 03/26/2022      Latest Ref Rng & Units 03/26/2022   10:02 AM 02/04/2021    9:16 AM 01/11/2020    8:37 AM  CBC  WBC 3.8 - 10.8 Thousand/uL 4.2  4.4  4.9   Hemoglobin 11.7 - 15.5 g/dL 09.6  28.3  66.2   Hematocrit 35.0 - 45.0 % 43.5  44.6  42.2   Platelets 140 - 400 Thousand/uL 189  196  194     Lab Results  Component Value Date   HGBA1C 6.0 (H) 03/26/2022   Lab Results  Component Value Date   TSH 3.22 03/26/2022    ================================================== I spent a total of 25 minutes with the patient spent in direct patient consultation.  Additional time spent with chart review  / charting (studies, outside notes, etc): 26 min Total Time: 51 min  Current medicines are reviewed at length with the patient today.  (+/- concerns) N/A  Notice: This dictation was prepared with Dragon dictation along with smart phrase technology. Any transcriptional  errors that result from this process are unintentional and may not be corrected upon review.   Studies Ordered:  Orders Placed This Encounter  Procedures   CT CORONARY MORPH W/CTA COR W/SCORE W/CA W/CM &/OR WO/CM   Basic metabolic panel   EKG 12-Lead   ECHOCARDIOGRAM COMPLETE   Meds ordered this encounter  Medications   metoprolol tartrate (LOPRESSOR) 50 MG tablet    Sig: Take 2 tablets (100 mg total) by mouth once for 1 dose. TAKE TWO HOURS PRIOR TO  SCHEDULE CARDIAC TEST    Dispense:  2 tablet    Refill:  0    Patient Instructions / Medication Changes & Studies & Tests Ordered   Patient Instructions  Medication Instructions:    See instruction below - one time dose of Metoprolol    *If you need a refill on your cardiac medications before your next appointment, please call your pharmacy*   Lab Work:   BMP if test is to be completed after 04/25/22   If you have labs (blood work) drawn today and your tests are completely normal, you will receive your results only by: MyChart Message (if you have MyChart) OR A paper copy in the mail If  you have any lab test that is abnormal or we need to change your treatment, we will call you to review the results.   Testing/Procedures:  Will be schedule in [facility]  office Your physician has requested that you have an echocardiogram. Echocardiography is a painless test that uses sound waves to create images of your heart. It provides your doctor with information about the size and shape of your heart and how well your heart's chambers and valves are working. This procedure takes approximately one hour. There are no restrictions for this procedure. Please do NOT wear cologne, perfume, aftershave, or lotions (deodorant is allowed). Please arrive 15 minutes prior to your appointment time.    And   Will be schedule at [address] - go to the entrance C off [address]  Your physician has requested that you have coronary   CTA. Coronary  computed tomography (CT)  angiogram is a painless test that uses an x-ray machine to take clear, detailed pictures of your heart arteries.  Please follow instruction sheet as given.   Follow-Up: At [facility], you and your health needs are our priority.  As part of our continuing mission to provide you with exceptional heart care, we have created designated Provider Care Teams.  These Care Teams include your primary Cardiologist (physician) and Advanced Practice Providers (APPs -  Physician Assistants and Nurse Practitioners) who all work together to provide you with the care you need, when you need it.     Your next appointment:   2 month(s)  The format for your next appointment:   In Person  Provider:   Bryan Lemma, MD   Other Instructions    Your cardiac CT will be scheduled at the below location:   [facility] [address] [place], [place] [address] [phone]    Marykay Lex, MD, MS Bryan Lemma, M.D., M.S. Interventional Cardiologist  [facility]  Pager # [phone] Phone # [phone] [address] [place], [place] [address]   Thank you for choosing [facility] at [place]!!

## 2022-04-07 NOTE — Patient Instructions (Addendum)
Medication Instructions:    See instruction below - one time dose of Metoprolol    *If you need a refill on your cardiac medications before your next appointment, please call your pharmacy*   Lab Work:   BMP if test is to be completed after 04/25/22   If you have labs (blood work) drawn today and your tests are completely normal, you will receive your results only by: MyChart Message (if you have MyChart) OR A paper copy in the mail If you have any lab test that is abnormal or we need to change your treatment, we will call you to review the results.   Testing/Procedures:  Will be schedule in [facility]  office Your physician has requested that you have an echocardiogram. Echocardiography is a painless test that uses sound waves to create images of your heart. It provides your doctor with information about the size and shape of your heart and how well your heart's chambers and valves are working. This procedure takes approximately one hour. There are no restrictions for this procedure. Please do NOT wear cologne, perfume, aftershave, or lotions (deodorant is allowed). Please arrive 15 minutes prior to your appointment time.    And   Will be schedule at [address] - go to the entrance [address] off [address]  Your physician has requested that you have coronary  CTA. Coronary  computed tomography (CT)  angiogram is a painless test that uses an x-ray machine to take clear, detailed pictures of your heart arteries.  Please follow instruction sheet as given.   Follow-Up: At [facility], you and your health needs are our priority.  As part of our continuing mission to provide you with exceptional heart care, we have created designated Provider Care Teams.  These Care Teams include your primary Cardiologist (physician) and Advanced Practice Providers (APPs -  Physician Assistants and Nurse Practitioners) who all work together to provide you with the care you need, when you need  it.     Your next appointment:   2 month(s)  The format for your next appointment:   In Person  Provider:   Bryan Lemma, MD   Other Instructions    Your cardiac CT will be scheduled at the below location:   [facility] [address] [place], [place] [address] [phone]     Please arrive at the [facility] and [facility]'s Entrance (Entrance C2) of [facility] 30 minutes prior to test start time. You can use the FREE valet parking offered at entrance C (encouraged to control the heart rate for the test)  Proceed to the [facility] Radiology Department (first floor) to check-in and test prep.  All radiology patients and guests should use entrance C2 at [facility], accessed from [facility], even though the hospital's physical address listed is [address].       Please follow these instructions carefully (unless otherwise directed):  Please have bMP done if test is schedule after 04/25/22   On the Night Before the Test: Be sure to Drink plenty of water. Do not consume any caffeinated/decaffeinated beverages or chocolate 12 hours prior to your test. Do not take any antihistamines 12 hours prior to your test.   On the Day of the Test: Drink plenty of water until 1 hour prior to the test. Do not eat any food 1 hour prior to test. You may take your regular medications prior to the test.  Take metoprolol (Lopressor)  100 mg ( total of 50 mg x 2)  two hours prior to test. HOLD Furosemide/Hydrochlorothiazide morning of the test. FEMALES- please wear underwire-free bra if available, avoid dresses & tight clothing           After the Test: Drink plenty of water. After receiving IV contrast, you may experience a mild flushed feeling. This is normal. On occasion, you may experience a mild rash up to 24 hours after the test. This is not dangerous. If this occurs, you can take Benadryl 25 mg and increase your fluid  intake. If you experience trouble breathing, this can be serious. If it is severe call 911 IMMEDIATELY. If it is mild, please call our office.   We will call to schedule your test 2-4 weeks out understanding that some insurance companies will need an authorization prior to the service being performed.   For non-scheduling related questions, please contact the cardiac imaging nurse navigator should you have any questions/concerns: Rockwell Alexandria, Cardiac Imaging Nurse Navigator Larey Brick, Cardiac Imaging Nurse Navigator [facility] Direct Office Dial: [phone]   For scheduling needs, including cancellations and rescheduling, please call [place], [phone].

## 2022-04-11 NOTE — Progress Notes (Incomplete)
Primary Care Provider: Alba Cory, MD [facility] Cardiologist: None Electrophysiologist: None  Clinic Note: No chief complaint on file.  ===================================  ASSESSMENT/PLAN   Problem List Items Addressed This Visit   None   ===================================  HPI:    Jane Patrick is an obese 61 y.o. female former smoker with PMH notable for mild COPD, ADHD and MDD, GERD/Esophageal Dysphagia, CKD 3A and history of aortic atherosclerosis, who is being seen today for the evaluation of Shortness of Breath on Exertion at the request of Alba Cory, MD.  Jane Patrick was just seen by Dr. Carlynn Purl on November 2.  She is complaining of worsening dyspnea on with activity that began a few months ago.  It does not bother her at rest.  She notes getting short of breath even at the end of the shower or casual walking.  Home neb therapy did not help.  No chest pain, nausea or lightheadedness.  Reported having a grandmother that died at age 52 of heart attack.  She was therefore referred for cardiology evaluation.    Recent Hospitalizations: None  Reviewed  CV studies:    The following studies were reviewed today: (if available, images/films reviewed: From Epic Chart or Care Everywhere) None:   Interval History:   [name] presents here today essentially with the same symptoms described above the visit with Dr. Carlynn Purl.  She says that she gets short of breath with just but any activity.  Doing laundry and any of her daytime cleaning activities.  She says that recently she is even started potentially noticing a little bit of tightness and pressure in her chest if she does not stop quickly. She also notes daytime sleepiness but says that she just does not feel that she gets enough sleep  CV Review of Symptoms (Summary) Cardiovascular ROS: no chest pain or dyspnea on exertion positive for - irregular heartbeat and fast HR when she can't breath.   negative for - edema, loss of consciousness, orthopnea, paroxysmal nocturnal dyspnea, or syncope/near syncope, TIA/amaurosis fugax; claudication  REVIEWED OF SYSTEMS   Review of Systems  Constitutional:  Positive for malaise/fatigue (all the time - like can sleep all the time- no Energy). Negative for chills, fever and weight loss.       Sweats - ? Menopause/ flushes  Respiratory:  Positive for shortness of breath. Negative for cough and wheezing.   Cardiovascular:  Positive for chest pain (off & on tightness.) and palpitations (rare flip-flopping - not long). Negative for claudication.  Gastrointestinal:  Negative for abdominal pain, blood in stool, constipation, diarrhea, heartburn (but will have of not taking PPI) and melena.  Genitourinary:  Negative for dysuria, hematuria and urgency.  Musculoskeletal:  Positive for back pain (with radiculopathy), joint pain and myalgias.       Aches all over; leg hurt @ night  Neurological:  Positive for dizziness (lying down; standing too fast). Negative for sensory change, speech change, focal weakness and loss of consciousness.  Psychiatric/Behavioral:  Positive for memory loss. Negative for depression. The patient is not nervous/anxious and does not have insomnia (occasoinally uses Tylenol PM -- wakes up exhausted).     I have reviewed and (if needed) personally updated the patient's problem list, medications, allergies, past medical and surgical history, social and family history.   PAST MEDICAL HISTORY   Past Medical History:  Diagnosis Date  . Allergy   . Asthma   . COPD (chronic obstructive pulmonary disease) (HCC)   .  Generalized headaches   . GERD (gastroesophageal reflux disease)     PAST SURGICAL HISTORY   Past Surgical History:  Procedure Laterality Date  . ABDOMINAL HYSTERECTOMY  2004   partial  . BREAST SURGERY     biopsies  . ESOPHAGOGASTRODUODENOSCOPY (EGD) WITH PROPOFOL N/A 08/02/2018   Procedure:  ESOPHAGOGASTRODUODENOSCOPY (EGD) WITH PROPOFOL;  Surgeon: [name], MD;  Location: [facility] ENDOSCOPY;  Service: Gastroenterology;  Laterality: N/A;  . FRACTURE SURGERY     rode in femur  . POLYPECTOMY     vocal chord polyps    Immunization History  Administered Date(s) Administered  . Influenza,inj,Quad PF,6+ Mos 06/15/2016, 03/29/2017, 03/23/2018, 05/18/2019, 03/26/2022  . PFIZER(Purple Top)SARS-COV-2 Vaccination 08/17/2019, 09/07/2019  . Tdap 10/29/2009  . Zoster Recombinat (Shingrix) 01/11/2020, 04/15/2020    MEDICATIONS/ALLERGIES   Current Meds  Medication Sig  . amphetamine-dextroamphetamine (ADDERALL) 10 MG tablet Take 1-2 tablets (10-20 mg total) by mouth 2 (two) times daily. 2 in am and 1 in pm  . ANORO ELLIPTA 62.5-25 MCG/ACT AEPB Inhale 1 puff into the lungs daily.  . cholecalciferol (VITAMIN D) 1000 UNITS tablet Take 2,000 Units by mouth daily.  . DULoxetine (CYMBALTA) 60 MG capsule TAKE 1 CAPSULE(60 MG) BY MOUTH DAILY  . Ipratropium-Albuterol (COMBIVENT RESPIMAT) 20-100 MCG/ACT AERS respimat Inhale 1 puff into the lungs every 6 (six) hours as needed for wheezing.  Marland Kitchen levothyroxine (SYNTHROID) 25 MCG tablet TAKE 2 TABLETS BY MOUTH 4 DAYS A WEEK, AND 1 TABLET FOR 3 DAYS A WEEK. TAKE DAILY BEFORE BREAKFAST.  Marland Kitchen omeprazole (PRILOSEC) 40 MG capsule TAKE 1 CAPSULE(40 MG) BY MOUTH DAILY  . SUMAtriptan (IMITREX) 100 MG tablet Take 1 tablet (100 mg total) by mouth as needed for migraine. May repeat in 2 hours if headache persists or recurs.  . topiramate (TOPAMAX) 100 MG tablet Take 1 tablet (100 mg total) by mouth 2 (two) times daily.  . traMADol (ULTRAM) 50 MG tablet Take 1 tablet by mouth 2 (two) times daily as needed.  Marland Kitchen Ubrogepant (UBRELVY) 100 MG TABS Take 1 tablet by mouth daily as needed.  . valACYclovir (VALTREX) 500 MG tablet TAKE 2 TABLETS BY MOUTH TWICE DAILY FOR 10 DAYS    Allergies  Allergen Reactions  . Codeine   . Pantoprazole     Flu like syptoms     SOCIAL HISTORY/FAMILY HISTORY   Reviewed in Epic:   Social History   Tobacco Use  . Smoking status: Former    Packs/day: 1.00    Years: 31.00    Total pack years: 31.00    Types: Cigarettes    Start date: 05/30/1975    Quit date: 05/25/2004    Years since quitting: 17.8  . Smokeless tobacco: Former  Advertising account planner  . Vaping Use: Never used  Substance Use Topics  . Alcohol use: No    Alcohol/week: 0.0 standard drinks of alcohol  . Drug use: No   Social History   Social History Narrative   Patient was laid off from May till Nov 2019, but is working again . Works from home for a [employer]    She is engaged     Works for [employer] (from home) Still ~"Engaged"  Family History  Problem Relation Age of Onset  . Colon cancer Father   . Breast cancer Sister   . Diverticulitis Brother   . Diverticulitis Brother    MGM- MI 5 - died  OBJCTIVE -PE, EKG, labs   Wt Readings from Last 3  Encounters:  04/07/22 166 lb 3.2 oz (75.4 kg)  03/26/22 166 lb (75.3 kg)  09/17/21 165 lb 14.4 oz (75.3 kg)    Physical Exam: BP (!) 148/78 (BP Location: Left Arm, Patient Position: Sitting, Cuff Size: Normal)   Pulse 93   Ht 5\' 2"  (1.575 m)   Wt 166 lb 3.2 oz (75.4 kg)   SpO2 96%   BMI 30.40 kg/m  Physical Exam   Adult ECG Report  Rate: *** ;  Rhythm: {rhythm:17366};   Narrative Interpretation: ***  Recent Labs:  ***  Lab Results  Component Value Date   CHOL 229 (H) 03/26/2022   HDL 50 03/26/2022   LDLCALC 149 (H) 03/26/2022   TRIG 161 (H) 03/26/2022   CHOLHDL 4.6 03/26/2022   Lab Results  Component Value Date   CREATININE 0.98 03/26/2022   BUN 20 03/26/2022   NA 143 03/26/2022   K 4.2 03/26/2022   CL 108 03/26/2022   CO2 26 03/26/2022      Latest Ref Rng & Units 03/26/2022   10:02 AM 02/04/2021    9:16 AM 01/11/2020    8:37 AM  CBC  WBC 3.8 - 10.8 Thousand/uL 4.2  4.4  4.9   Hemoglobin 11.7 - 15.5 g/dL 01/13/2020  16.1  09.6   Hematocrit 35.0 - 45.0  % 43.5  44.6  42.2   Platelets 140 - 400 Thousand/uL 189  196  194     Lab Results  Component Value Date   HGBA1C 6.0 (H) 03/26/2022   Lab Results  Component Value Date   TSH 3.22 03/26/2022    ================================================== I spent a total of *** minutes with the patient spent in direct patient consultation.  Additional time spent with chart review  / charting (studies, outside notes, etc): *** min Total Time: *** min  Current medicines are reviewed at length with the patient today.  (+/- concerns) ***  Notice: This dictation was prepared with Dragon dictation along with smart phrase technology. Any transcriptional errors that result from this process are unintentional and may not be corrected upon review.   Studies Ordered:  No orders of the defined types were placed in this encounter.  No orders of the defined types were placed in this encounter.   Patient Instructions / Medication Changes & Studies & Tests Ordered   There are no Patient Instructions on file for this visit.    13/06/2021, MD, MS Marykay Lex, M.D., M.S. Interventional Cardiologist  [facility]  Pager # [phone] Phone # [phone] [address] [place], [place]   Thank you for choosing [facility] [place]!!

## 2022-04-12 ENCOUNTER — Encounter: Payer: Self-pay | Admitting: Cardiology

## 2022-04-12 NOTE — Assessment & Plan Note (Signed)
This is a new symptom that she had not mentioned to her PCP.  Precordial discomfort in association with profound exertional dyspnea is deafly concerning for CAD given her family history and her only history of smoking with poorly controlled lipids and hypertension.  Plan: Assess for CAD with coronary CT angiogram.-We will need pre-CT labs.

## 2022-04-12 NOTE — Assessment & Plan Note (Signed)
Pretty limiting dyspnea.  Currently I think she is somewhat deconditioned and has mild COPD, but this is relatively new progression of symptoms and now there is concern for some possible precordial pain.  Need to exclude cardiac etiology.  Plan: Coronary CTA and 2D echo.

## 2022-04-12 NOTE — Assessment & Plan Note (Signed)
Not unexpected in a 61 year old woman with hyperlipidemia and smoking history.  Baseline risk factor modification-needs lipid control as well as blood pressure control.  Thankfully she is no longer smoking.

## 2022-04-12 NOTE — Assessment & Plan Note (Signed)
Clearly this is playing a role in her dyspnea, but it seems like there are progression of her dyspnea is more consistent with a different etiology.

## 2022-04-13 ENCOUNTER — Ambulatory Visit: Payer: 59 | Attending: Cardiology

## 2022-04-13 DIAGNOSIS — R0609 Other forms of dyspnea: Secondary | ICD-10-CM | POA: Diagnosis not present

## 2022-04-13 DIAGNOSIS — I5030 Unspecified diastolic (congestive) heart failure: Secondary | ICD-10-CM | POA: Diagnosis not present

## 2022-04-13 HISTORY — PX: TRANSTHORACIC ECHOCARDIOGRAM: SHX275

## 2022-04-13 HISTORY — PX: CT CTA CORONARY W/CA SCORE W/CM &/OR WO/CM: HXRAD787

## 2022-04-13 LAB — ECHOCARDIOGRAM COMPLETE
AR max vel: 2.13 cm2
AV Peak grad: 6.8 mmHg
Ao pk vel: 1.3 m/s
Area-P 1/2: 2.71 cm2
Calc EF: 71.7 %
S' Lateral: 1.7 cm
Single Plane A2C EF: 70.5 %
Single Plane A4C EF: 72.3 %

## 2022-04-14 ENCOUNTER — Other Ambulatory Visit: Payer: Self-pay | Admitting: Family Medicine

## 2022-04-14 DIAGNOSIS — J449 Chronic obstructive pulmonary disease, unspecified: Secondary | ICD-10-CM

## 2022-04-17 ENCOUNTER — Telehealth (HOSPITAL_COMMUNITY): Payer: Self-pay | Admitting: *Deleted

## 2022-04-17 NOTE — Telephone Encounter (Signed)
Reaching out to patient to offer assistance regarding upcoming cardiac imaging study; pt verbalizes understanding of appt date/time, parking situation and where to check in, pre-test NPO status and medications ordered, and verified current allergies; name and call back number provided for further questions should they arise ? ?Shamyia Grandpre RN Navigator Cardiac Imaging ?[facility] ?[phone] office ?[phone] cell ? ?Patient to take 100mg metoprolol tartrate two hours prior to her cardiac CT scan.  She is aware to arrive at 1:30pm. ? ?

## 2022-04-21 ENCOUNTER — Ambulatory Visit (HOSPITAL_COMMUNITY)
Admission: RE | Admit: 2022-04-21 | Discharge: 2022-04-21 | Disposition: A | Discharge status: Home / Self Care | Payer: 59 | Source: Ambulatory Visit | Attending: Cardiology | Admitting: Cardiology

## 2022-04-21 DIAGNOSIS — R072 Precordial pain: Secondary | ICD-10-CM | POA: Diagnosis present

## 2022-04-21 DIAGNOSIS — R0609 Other forms of dyspnea: Secondary | ICD-10-CM | POA: Insufficient documentation

## 2022-04-21 MED ORDER — IOHEXOL 350 MG/ML SOLN
100.0000 mL | Freq: Once | INTRAVENOUS | Status: AC | PRN
  Administered 2022-04-21: 100 mL via INTRAVENOUS

## 2022-04-21 MED ORDER — NITROGLYCERIN 0.4 MG SL SUBL
SUBLINGUAL_TABLET | SUBLINGUAL | Status: AC
  Filled 2022-04-21: qty 2

## 2022-04-21 MED ORDER — NITROGLYCERIN 0.4 MG SL SUBL
0.8000 mg | SUBLINGUAL_TABLET | Freq: Once | SUBLINGUAL | Status: AC
  Administered 2022-04-21: 0.8 mg via SUBLINGUAL

## 2022-06-26 NOTE — Progress Notes (Unsigned)
Name: Jane Patrick   MRN: [id number]    DOB: 1960-12-12   Date:06/29/2022       Progress Note  Subjective  Chief Complaint  Follow Up  HPI  ADD : she has been taking Adderal now, taking it prn, She states sometimes 1 or 2 pills mostly once a day, occasionally takes two pills. No side effects   SOB: resolved once she bought a new place with her daughter .   Chronic back pain:  she is seeing [name] and had MRI done 03/30/2019, and showed L2- 3  Disc bulge , and also a hypointense bone lesion, repeat Feb 2021 showed stability . She continues to have daily pain described as aching like and average 2/10 , she is taking Tramadol prn .   Migraine headache: she is on Topamax for prevention,  usually triggered by barometric pressure changes, but having episodes now due to trying to cut on caffeine . Episodes are about 2-  3 times a month but had one episode last week that lasted a few days.  Throbbing and tightness associated with photophobia occasionally nausea    Herpes Type II: she had a small lesion when she was seen by [name] and culture was positive for herpes, she was given Valtrex, no other outbreaks    COPD Mild: No longer smoking - quit about 13 years ago. She uses Anoro daily , she uses combivent prn very seldom now. She was more SOB and saw cardiologist but symptoms improved. Echo normal   B12 deficiency: found in June 2016, she is taking supplementation She also has vitamin D deficiency and is taking supplements    Major Depression Currently  Duloxetine, no side effects. She has difficulty staying asleep. We will try low dose Seroquel . She denies crying spells or change in appetite Phq 9 is positive today. Broke up with boyfriend, living with her daughter now, they have a new place    Hypothyroidism: she is taking levothyroxine, no hair loss, no change in bowel movements Last TSH at goal. She feels tired but states likely from poor sleep   Pre diabetes: Last A1C 6 % ,  discussed low carb diet and regular physical activity    Atherosclerosis of aorta: explained that is a risk of heart disease but she is still not ready to start statin therapy, she saw the cardiologist but she states they didn't discuss it. Discussed taking aspirin 81 mg daily    Dysphagia/GERD: seen by Dr. Allegra Lai and had dilation done 08/2018 and is doing well since No dysphagia as long ash she takes PPI daily   Abnormal mammogram: she had biopsy and per patient normal results.   Patient Active Problem List   Diagnosis Date Noted   Dyslipidemia 06/29/2022   Hyperglycemia 06/29/2022   Dyspnea on minimal exertion 04/07/2022   Precordial pain 04/07/2022   Hyperlipidemia due to dietary fat intake 04/07/2022   Esophageal dysphagia    Hypothyroidism in adult 08/15/2017   Atherosclerosis of aorta (HCC) 09/22/2016   Hiatal hernia 09/22/2016   Abnormal liver CT 09/11/2016   H/O herpes simplex type 2 infection 08/20/2015   Hematuria 02/19/2015   B12 deficiency 02/19/2015   Attention deficit hyperactivity disorder (ADHD), predominantly inattentive type 11/13/2014   COPD, mild (HCC) 11/13/2014   Major depression in remission (HCC) 11/13/2014   History of basal cell carcinoma 11/13/2014   GERD without esophagitis 11/13/2014   History of abnormal cervical Pap smear 11/13/2014   Migraine without aura  and without status migrainosus, not intractable 11/13/2014   Vitamin D deficiency 11/13/2014   Liver lesion 10/04/2010    Past Surgical History:  Procedure Laterality Date   ABDOMINAL HYSTERECTOMY  2004   partial   BREAST SURGERY     biopsies   ESOPHAGOGASTRODUODENOSCOPY (EGD) WITH PROPOFOL N/A 08/02/2018   Procedure: ESOPHAGOGASTRODUODENOSCOPY (EGD) WITH PROPOFOL;  Surgeon: Lin Landsman, MD;  Location: [place];  Service: Gastroenterology;  Laterality: N/A;   FRACTURE SURGERY     rode in femur   POLYPECTOMY     vocal chord polyps    Family History  Problem Relation Age of  Onset   Colon cancer Father    Breast cancer Sister    Diverticulitis Brother    Diverticulitis Brother    Heart attack Maternal Grandmother 63    Social History   Tobacco Use   Smoking status: Former    Packs/day: 1.00    Years: 31.00    Total pack years: 31.00    Types: Cigarettes    Start date: 05/30/1975    Quit date: 05/25/2004    Years since quitting: 18.1   Smokeless tobacco: Former  Substance Use Topics   Alcohol use: No    Alcohol/week: 0.0 standard drinks of alcohol     Current Outpatient Medications:    ANORO ELLIPTA 62.5-25 MCG/ACT AEPB, INHALE 1 PUFF INTO THE LUNGS DAILY, Disp: 180 each, Rfl: 1   cholecalciferol (VITAMIN D) 1000 UNITS tablet, Take 2,000 Units by mouth daily., Disp: , Rfl:    DULoxetine (CYMBALTA) 60 MG capsule, TAKE 1 CAPSULE(60 MG) BY MOUTH DAILY, Disp: 90 capsule, Rfl: 1   Ipratropium-Albuterol (COMBIVENT RESPIMAT) 20-100 MCG/ACT AERS respimat, Inhale 1 puff into the lungs every 6 (six) hours as needed for wheezing., Disp: 12 g, Rfl: 1   omeprazole (PRILOSEC) 40 MG capsule, TAKE 1 CAPSULE(40 MG) BY MOUTH DAILY, Disp: 90 capsule, Rfl: 1   QUEtiapine (SEROQUEL) 25 MG tablet, Take 1 tablet (25 mg total) by mouth at bedtime., Disp: 90 tablet, Rfl: 0   SUMAtriptan (IMITREX) 100 MG tablet, Take 1 tablet (100 mg total) by mouth as needed for migraine. May repeat in 2 hours if headache persists or recurs., Disp: 18 tablet, Rfl: 1   traMADol (ULTRAM) 50 MG tablet, Take 1 tablet by mouth 2 (two) times daily as needed., Disp: , Rfl:    valACYclovir (VALTREX) 500 MG tablet, TAKE 2 TABLETS BY MOUTH TWICE DAILY FOR 10 DAYS, Disp: 20 tablet, Rfl: 0   amphetamine-dextroamphetamine (ADDERALL) 10 MG tablet, Take 1-2 tablets (10-20 mg total) by mouth 2 (two) times daily. 2 in am and 1 in pm, Disp: 90 tablet, Rfl: 0   levothyroxine (SYNTHROID) 25 MCG tablet, TAKE 2 TABLETS BY MOUTH 4 DAYS A WEEK, AND 1 TABLET FOR 3 DAYS A WEEK. TAKE DAILY BEFORE BREAKFAST., Disp: 150  tablet, Rfl: 0   topiramate (TOPAMAX) 100 MG tablet, Take 1 tablet (100 mg total) by mouth 2 (two) times daily., Disp: 180 tablet, Rfl: 1   Ubrogepant (UBRELVY) 100 MG TABS, Take 1 tablet (100 mg total) by mouth daily as needed., Disp: 16 tablet, Rfl: 2  Allergies  Allergen Reactions   Codeine    Pantoprazole     Flu like syptoms    I personally reviewed active problem list, medication list, allergies, family history, social history, health maintenance with the patient/caregiver today.   ROS  Ten systems reviewed and is negative except as mentioned in HPI    Objective  Vitals:   06/29/22 0752  BP: 128/82  Pulse: 96  Resp: 16  SpO2: 96%  Weight: 163 lb (73.9 kg)  Height: 5\' 2"  (1.575 m)    Body mass index is 29.81 kg/m.  Physical Exam  Constitutional: Patient appears well-developed and well-nourished.  No distress.  HEENT: head atraumatic, normocephalic, pupils equal and reactive to light, neck supple Cardiovascular: Normal rate, regular rhythm and normal heart sounds.  No murmur heard. No BLE edema. Pulmonary/Chest: Effort normal and breath sounds normal. No respiratory distress. Abdominal: Soft.  There is no tenderness. Psychiatric: Patient has a normal mood and affect. behavior is normal. Judgment and thought content normal.   Recent Results (from the past 2160 hour(s))  ECHOCARDIOGRAM COMPLETE     Status: None   Collection Time: 04/13/22  4:47 PM  Result Value Ref Range   AR max vel 2.13 cm2   AV Peak grad 6.8 mmHg   Ao pk vel 1.30 m/s   S' Lateral 1.70 cm   Area-P 1/2 2.71 cm2   Single Plane A4C EF 72.3 %   Single Plane A2C EF 70.5 %   Calc EF 71.7 %    PHQ2/9:    06/29/2022    7:52 AM 03/26/2022    9:11 AM 12/19/2021    8:01 AM 09/17/2021    2:03 PM 05/06/2021   11:14 AM  Depression screen PHQ 2/9  Decreased Interest 2 0 0 1 1  Down, Depressed, Hopeless 1 0 0 2 0  PHQ - 2 Score 3 0 0 3 1  Altered sleeping 3 0 0 3 3  Tired, decreased energy 3 0 0 3  3  Change in appetite 0 0 0 3 0  Feeling bad or failure about yourself  0 0 0 0 0  Trouble concentrating 0 0 0 3 0  Moving slowly or fidgety/restless 0 0 0 0 0  Suicidal thoughts 0 0 0 0 0  PHQ-9 Score 9 0 0 15 7  Difficult doing work/chores  Not difficult at all Not difficult at all Somewhat difficult Somewhat difficult    phq 9 is positive   Fall Risk:    06/29/2022    7:52 AM 03/26/2022    9:11 AM 12/19/2021    8:01 AM 09/17/2021    1:59 PM 05/06/2021   11:14 AM  Fall Risk   Falls in the past year? 0 0 0 0 0  Number falls in past yr: 0 0 0  0  Injury with Fall? 0 0 0  0  Risk for fall due to : No Fall Risks No Fall Risks No Fall Risks No Fall Risks No Fall Risks  Follow up Falls prevention discussed Education provided;Falls evaluation completed;Falls prevention discussed Falls prevention discussed;Education provided Falls prevention discussed Falls prevention discussed      Functional Status Survey: Is the patient deaf or have difficulty hearing?: No Does the patient have difficulty seeing, even when wearing glasses/contacts?: No Does the patient have difficulty concentrating, remembering, or making decisions?: No Does the patient have difficulty walking or climbing stairs?: No Does the patient have difficulty dressing or bathing?: No Does the patient have difficulty doing errands alone such as visiting a doctor's office or shopping?: No    Assessment & Plan  1. COPD, mild ([place])  Continue medication  2. Atherosclerosis of aorta (HCC)  Refuses statin therapy   3. Chronic Major Depression  ([place])  Discussed change in therapy, she states " I don't think it is  depression"  4. B12 deficiency  Continue supplementation   5. Vitamin D deficiency  Continue supplementation  6. Migraine without aura and without status migrainosus, not intractable  - topiramate (TOPAMAX) 100 MG tablet; Take 1 tablet (100 mg total) by mouth 2 (two) times daily.  Dispense: 180 tablet;  Refill: 1 - Ubrogepant (UBRELVY) 100 MG TABS; Take 1 tablet (100 mg total) by mouth daily as needed.  Dispense: 16 tablet; Refill: 2  7. Attention deficit hyperactivity disorder (ADHD), predominantly inattentive type  - amphetamine-dextroamphetamine (ADDERALL) 10 MG tablet; Take 1-2 tablets (10-20 mg total) by mouth 2 (two) times daily. 2 in am and 1 in pm  Dispense: 90 tablet; Refill: 0  8. Psychophysiological insomnia  - QUEtiapine (SEROQUEL) 25 MG tablet; Take 1 tablet (25 mg total) by mouth at bedtime.  Dispense: 90 tablet; Refill: 0  9. Hypothyroidism in adult  - levothyroxine (SYNTHROID) 25 MCG tablet; TAKE 2 TABLETS BY MOUTH 4 DAYS A WEEK, AND 1 TABLET FOR 3 DAYS A WEEK. TAKE DAILY BEFORE BREAKFAST.  Dispense: 150 tablet; Refill: 0  10. GERD without esophagitis   11. Dyslipidemia   12. Hyperglycemia

## 2022-06-29 ENCOUNTER — Ambulatory Visit: Payer: 59 | Admitting: Family Medicine

## 2022-06-29 ENCOUNTER — Encounter: Payer: Self-pay | Admitting: Family Medicine

## 2022-06-29 VITALS — BP 128/82 | HR 96 | Resp 16 | Ht 62.0 in | Wt 163.0 lb

## 2022-06-29 DIAGNOSIS — K219 Gastro-esophageal reflux disease without esophagitis: Secondary | ICD-10-CM

## 2022-06-29 DIAGNOSIS — R739 Hyperglycemia, unspecified: Secondary | ICD-10-CM | POA: Insufficient documentation

## 2022-06-29 DIAGNOSIS — E538 Deficiency of other specified B group vitamins: Secondary | ICD-10-CM | POA: Diagnosis not present

## 2022-06-29 DIAGNOSIS — E559 Vitamin D deficiency, unspecified: Secondary | ICD-10-CM

## 2022-06-29 DIAGNOSIS — I700 Atherosclerosis of aorta: Secondary | ICD-10-CM | POA: Diagnosis not present

## 2022-06-29 DIAGNOSIS — F5104 Psychophysiologic insomnia: Secondary | ICD-10-CM

## 2022-06-29 DIAGNOSIS — E785 Hyperlipidemia, unspecified: Secondary | ICD-10-CM | POA: Insufficient documentation

## 2022-06-29 DIAGNOSIS — J449 Chronic obstructive pulmonary disease, unspecified: Secondary | ICD-10-CM | POA: Diagnosis not present

## 2022-06-29 DIAGNOSIS — F339 Major depressive disorder, recurrent, unspecified: Secondary | ICD-10-CM | POA: Diagnosis not present

## 2022-06-29 DIAGNOSIS — G43009 Migraine without aura, not intractable, without status migrainosus: Secondary | ICD-10-CM

## 2022-06-29 DIAGNOSIS — E039 Hypothyroidism, unspecified: Secondary | ICD-10-CM

## 2022-06-29 DIAGNOSIS — F900 Attention-deficit hyperactivity disorder, predominantly inattentive type: Secondary | ICD-10-CM

## 2022-06-29 MED ORDER — TOPIRAMATE 100 MG PO TABS: 100.0000 mg | ORAL_TABLET | Freq: Two times a day (BID) | ORAL | 1 refills | Status: DC

## 2022-06-29 MED ORDER — AMPHETAMINE-DEXTROAMPHETAMINE 10 MG PO TABS: 10.0000 mg | ORAL_TABLET | Freq: Two times a day (BID) | ORAL | 0 refills | Status: DC

## 2022-06-29 MED ORDER — LEVOTHYROXINE SODIUM 25 MCG PO TABS: ORAL_TABLET | ORAL | 0 refills | Status: DC

## 2022-06-29 MED ORDER — QUETIAPINE FUMARATE 25 MG PO TABS: 25.0000 mg | ORAL_TABLET | Freq: Every day | ORAL | 0 refills | Status: DC

## 2022-06-29 MED ORDER — UBRELVY 100 MG PO TABS: 1.0000 | ORAL_TABLET | Freq: Every day | ORAL | 2 refills | Status: DC | PRN

## 2022-07-08 NOTE — Progress Notes (Signed)
Primary Care Provider: Steele Sizer, [place] Cardiologist: Glenetta Hew, MD Electrophysiologist: None  Clinic Note: Chief Complaint  Patient presents with   Follow-up    6 month F/U-No new cardiac concerns. "I am feeling much better."  Review of test results.   ===================================  ASSESSMENT/PLAN   Problem List Items Addressed This Visit       Cardiology Problems   Hyperlipidemia due to dietary fat intake (Chronic)    LDL was 149 with a total close up to 29.  Thankfully, Coronary Calcium Score is relatively low and there is only mild to moderate coronary plaque as well as aortic plaque noted.  Would probably shoot for an LDL least less than 100 with targeting less than 70.  She would prefer to avoid medications and would like to try lifestyle modification.  Discussed dietary changes and exercise with weight loss.  Would recommend follow-up labs with PCP after 6 to 9 months to see if there is been any benefit, if not we will probably consider intermittent therapy with rosuvastatin starting at moderate dose of 20 mg      Atherosclerosis of aorta (HCC) - Primary (Chronic)    Mild aortic atherosclerosis noted, but no significant plaque noted in the coronaries.  Baseline cardiovascular risk factor modification warranted. She quit smoking.  Would closely follow blood pressure and lipid control.  She would like to try dietary and exercise modifications in lieu of medications for lipids.  Recommend increased exercise and diet change.        Other   Precordial pain    Very reassuring results on Coronary CTA.  Unlikely that her chest discomfort was related to coronary artery disease. Will consider more musculoskeletal etiology for pain.      Hyperglycemia (Chronic)   Elevated blood pressure reading    BP was high today even on recheck.  She says that she was morning late getting here rushed to get and had had just sat down and they called her  back.  There was a notable improvement on recheck, still elevated.  Would continue to monitor closely, initial evaluation BP was pretty normal.  Hopefully with lifestyle modifications for lipid control, this will also help her blood pressure control as well.  Again, she is hoping to avoid requiring medications      Dyspnea on minimal exertion    This seems to be related to allergies as she is notably better after moving to different house.  Cardiac evaluation was negative.  Normal echo and normal Coronary CTA       ===================================  HPI:    Jane Patrick is an obese 62 y.o. female former smoker with PMH notable for mild COPD, ADHD and MDD, GERD/Esophageal Dysphagia, CKD 3A and history of aortic atherosclerosis, who is being seen today for the 3 Month f/u Evaluation of Shortness of Breath on Exertion -> follow-up to discuss test results.  Jane Patrick presents here today at the request of  by Dr. Ancil Boozer when she noted worsening dyspnea with minimal activity during the March 26, 2022 clinic visit.  No dyspnea at rest.  Noting getting dyspneic at the end of the shower schedule walking.  No help with home neb therapy.  No chest pain or pressure.  Grandmother died at age 73 or MI. => Referred to cardiology  Jane Patrick was seen for this consultation on 04/07/2022 -> she noted similar symptoms as noted above.  Dyspneic with just that any activity.  She notes that with doing her laundry or other cleaning activities in the house.  Maybe a little chest tightness and pressure just if she does not stop.   Some daytime sleepiness but does not just gets enough sleep.  No CHF symptoms.  Occasional irregular heartbeats/fast heart rate makes it difficult to breathe. => Evaluated with 2D echo and Coronary CTA  Recent Hospitalizations: None  Reviewed  CV studies:    The following studies were reviewed today: (if available, images/films reviewed: From Epic Chart or Care  Everywhere) TTE 04/13/2022: Normal LV size and function.  EF 60 to 65%.  No RWMA.  GR 1 DD.  Normal RV.  Normal aortic and mitral valves.  Normal RAP.--NORMAL STUDY. 04/21/2022-Coronary CTA: No extracardiac abnormalities.  Coronary Calcium Score 0.  Nonobstructive mild disease in the LAD (25 to 49%).  Otherwise no obstructive coronary disease with a right dominant system.  Interval History:   Jane Patrick presents here today for 22-monthfollow-up to discuss test results.. Dyspnea improved after moving to a new home -- concerned that mold & allergens in old home were playing a role.  She feels much better from a breathing standpoint.   CV Review of Symptoms (Summary) Cardiovascular ROS: no chest pain or dyspnea on exertion positive for - irregular heartbeat and fast HR when she can't breath -- but much less prominent. negative for - dyspnea on exertion, edema, loss of consciousness, orthopnea, paroxysmal nocturnal dyspnea, shortness of breath, or syncope/near syncope, TIA/amaurosis fugax; claudication  REVIEWED OF SYSTEMS   Review of Systems  Constitutional:  Positive for malaise/fatigue (Related to poor sleep). Negative for weight loss.  Gastrointestinal:  Positive for heartburn (Controlled with PPI).  Musculoskeletal:  Positive for back pain, joint pain and myalgias.  Psychiatric/Behavioral:  Positive for memory loss. The patient does not have insomnia (Tries to use Tylenol PM to help her sleep.).     PAST MEDICAL HISTORY   Past Medical History:  Diagnosis Date   Allergy    Asthma    COPD (chronic obstructive pulmonary disease) (HCC)    Generalized headaches    GERD (gastroesophageal reflux disease)     PAST SURGICAL HISTORY   Past Surgical History:  Procedure Laterality Date   ABDOMINAL HYSTERECTOMY  2004   partial   BREAST SURGERY     biopsies   CT CTA CORONARY W/CA SCORE W/CM &/OR WO/CM  04/13/2022   No extracardiac abnormalities.  Coronary Calcium Score 0.   Nonobstructive mild disease in the LAD (25 to 49%).  Otherwise no obstructive coronary disease with a right dominant system.   ESOPHAGOGASTRODUODENOSCOPY (EGD) WITH PROPOFOL N/A 08/02/2018   Procedure: ESOPHAGOGASTRODUODENOSCOPY (EGD) WITH PROPOFOL;  Surgeon: VLin Landsman MD;  Location: [facility]  Service: Gastroenterology;  Laterality: N/A;   FRACTURE SURGERY     rode in femur   POLYPECTOMY     vocal chord polyps   TRANSTHORACIC ECHOCARDIOGRAM  04/13/2022   Normal LV size and function.  EF 60 to 65%.  No RWMA.  GR 1 DD.  Normal RV.  Normal aortic and mitral valves.  Normal RAP.--NORMAL STUDY   MEDICATIONS/ALLERGIES   Current Meds  Medication Sig   amphetamine-dextroamphetamine (ADDERALL) 10 MG tablet Take 1-2 tablets (10-20 mg total) by mouth 2 (two) times daily. 2 in am and 1 in pm   ANORO ELLIPTA 62.5-25 MCG/ACT AEPB INHALE 1 PUFF INTO THE LUNGS DAILY   cholecalciferol (VITAMIN D) 1000 UNITS tablet Take 2,000 Units by mouth daily.  DULoxetine (CYMBALTA) 60 MG capsule TAKE 1 CAPSULE(60 MG) BY MOUTH DAILY   Ipratropium-Albuterol (COMBIVENT RESPIMAT) 20-100 MCG/ACT AERS respimat Inhale 1 puff into the lungs every 6 (six) hours as needed for wheezing.   levothyroxine (SYNTHROID) 25 MCG tablet TAKE 2 TABLETS BY MOUTH 4 DAYS A WEEK, AND 1 TABLET FOR 3 DAYS A WEEK. TAKE DAILY BEFORE BREAKFAST.   omeprazole (PRILOSEC) 40 MG capsule TAKE 1 CAPSULE(40 MG) BY MOUTH DAILY   QUEtiapine (SEROQUEL) 25 MG tablet Take 1 tablet (25 mg total) by mouth at bedtime.   SUMAtriptan (IMITREX) 100 MG tablet Take 1 tablet (100 mg total) by mouth as needed for migraine. May repeat in 2 hours if headache persists or recurs.   topiramate (TOPAMAX) 100 MG tablet Take 1 tablet (100 mg total) by mouth 2 (two) times daily.   traMADol (ULTRAM) 50 MG tablet Take 1 tablet by mouth 2 (two) times daily as needed.   Ubrogepant (UBRELVY) 100 MG TABS Take 1 tablet (100 mg total) by mouth daily as needed.    valACYclovir (VALTREX) 500 MG tablet TAKE 2 TABLETS BY MOUTH TWICE DAILY FOR 10 DAYS    Allergies  Allergen Reactions   Codeine    Pantoprazole     Flu like syptoms    SOCIAL HISTORY/FAMILY HISTORY   Reviewed in Epic:   Social History   Tobacco Use   Smoking status: Former    Packs/day: 1.00    Years: 31.00    Total pack years: 31.00    Types: Cigarettes    Start date: 05/30/1975    Quit date: 05/25/2004    Years since quitting: 18.1   Smokeless tobacco: Former  Scientific laboratory technician Use: Never used  Substance Use Topics   Alcohol use: No    Alcohol/week: 0.0 standard drinks of alcohol   Drug use: No   Social History   Social History Narrative   Patient was laid off from May till Nov 2019, but is working again . 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) Still ~"Engaged"  Family History  Problem Relation Age of Onset   Colon cancer Father    Breast cancer Sister    Diverticulitis Brother    Diverticulitis Brother    Heart attack Maternal Grandmother 7   MGM- MI 43 - died  OBJCTIVE -PE, EKG, labs   Wt Readings from Last 3 Encounters:  07/09/22 164 lb (74.4 kg)  06/29/22 163 lb (73.9 kg)  04/07/22 166 lb 3.2 oz (75.4 kg)    Physical Exam: BP (!) 154/88   Pulse 99   Ht '5\' 2"'$  (1.575 m)   Wt 164 lb (74.4 kg)   SpO2 96%   BMI 30.00 kg/m     07/09/2022    9:25 AM 07/09/2022    9:03 AM 06/29/2022    7:52 AM  Vitals with BMI  Height  '5\' 2"'$  '5\' 2"'$   Weight  164 lbs 163 lbs  BMI  [id number]  Systolic 123456 123456 0000000  Diastolic 88 [id number] 82  Pulse  99 96    Physical Exam Vitals (Repeat blood pressure was 146/78) reviewed.  Constitutional:      General: She is not in acute distress.    Appearance: Normal appearance. She is obese. She is not ill-appearing or toxic-appearing.  HENT:     Head: Normocephalic and atraumatic.  Neck:  Vascular: No carotid bruit or JVD.  Cardiovascular:     Rate and  Rhythm: Normal rate and regular rhythm. No extrasystoles are present.    Chest Wall: PMI is not displaced.     Pulses: Normal pulses and intact distal pulses.     Heart sounds: S1 normal and S2 normal. Heart sounds are distant. No murmur heard.    No friction rub. No gallop.  Pulmonary:     Effort: Pulmonary effort is normal. No respiratory distress.     Breath sounds: Normal breath sounds.  Musculoskeletal:        General: No swelling.     Cervical back: Normal range of motion and neck supple.  Skin:    General: Skin is warm and dry.  Neurological:     General: No focal deficit present.     Mental Status: She is alert and oriented to person, place, and time.     Gait: Gait normal.  Psychiatric:        Mood and Affect: Mood normal.        Behavior: Behavior normal.        Thought Content: Thought content normal.        Judgment: Judgment normal.     Adult ECG Report Not checked.  Recent Labs: Reviewed - discussed lifestyle modifications & reassess in 6-12 months to reassess -- but may need to consider Rx. Goal LDL < 100. Diet seems good, but needs more exercise.  Lab Results  Component Value Date   CHOL 229 (H) 03/26/2022   HDL 50 03/26/2022   LDLCALC 149 (H) 03/26/2022   TRIG 161 (H) 03/26/2022   CHOLHDL 4.6 03/26/2022   Lab Results  Component Value Date   CREATININE 0.98 03/26/2022   BUN 20 03/26/2022   NA 143 03/26/2022   K 4.2 03/26/2022   CL 108 03/26/2022   CO2 26 03/26/2022      Latest Ref Rng & Units 03/26/2022   10:02 AM 02/04/2021    9:16 AM 01/11/2020    8:37 AM  CBC  WBC 3.8 - 10.8 Thousand/uL 4.2  4.4  4.9   Hemoglobin 11.7 - 15.5 g/dL 13.8  14.6  14.0   Hematocrit 35.0 - 45.0 % 43.5  44.6  42.2   Platelets 140 - 400 Thousand/uL 189  196  194     Lab Results  Component Value Date   HGBA1C 6.0 (H) 03/26/2022   Lab Results  Component Value Date   TSH 3.22 03/26/2022    ================================================== I spent a total of 14  minutes with the patient spent in direct patient consultation.  Additional time spent with chart review  / charting (studies, outside notes, etc): 17 (9) min Total Time: 31  min  Current medicines are reviewed at length with the patient today.  (+/- concerns) N/A  Notice: This dictation was prepared with Dragon dictation along with smart phrase technology. Any transcriptional errors that result from this process are unintentional and may not be corrected upon review.   Studies Ordered:  No orders of the defined types were placed in this encounter.  No orders of the defined types were placed in this encounter.   Patient Instructions / Medication Changes & Studies & Tests Ordered   Patient Instructions  Medication Instructions:  - Your physician recommends that you continue on your current medications as directed. Please refer to the Current Medication list given to you today.  *If you need a refill on your cardiac medications  before your next appointment, please call your pharmacy*   Lab Work: - none ordered  If you have labs (blood work) drawn today and your tests are completely normal, you will receive your results only by: [place] (if you have MyChart) OR A paper copy in the mail If you have any lab test that is abnormal or we need to change your treatment, we will call you to review the results.   Testing/Procedures: - none ordered   Follow-Up: At [facility], you and your health needs are our priority.  As part of our continuing mission to provide you with exceptional heart care, we have created designated Provider Care Teams.  These Care Teams include your primary Cardiologist (physician) and Advanced Practice Providers (APPs -  Physician Assistants and Nurse Practitioners) who all work together to provide you with the care you need, when you need it.  We recommend signing up for the patient portal called "MyChart".  Sign up information is provided on  this After Visit Summary.  MyChart is used to connect with patients for Virtual Visits (Telemedicine).  Patients are able to view lab/test results, encounter notes, upcoming appointments, etc.  Non-urgent messages can be sent to your provider as well.   To learn more about what you can do with MyChart, go to [email or web address].    Your next appointment:   As needed   Provider:   Glenetta Hew, MD    Other Instructions N/a     Leonie Man, MD, MS Glenetta Hew, M.D., M.S. Interventional Cardiologist  [facility]  Pager # [phone] Phone # [phone] [address] [place], [place] [address]   Thank you for choosing [facility]!!

## 2022-07-09 ENCOUNTER — Ambulatory Visit: Payer: 59 | Attending: Cardiology | Admitting: Cardiology

## 2022-07-09 ENCOUNTER — Encounter: Payer: Self-pay | Admitting: Cardiology

## 2022-07-09 VITALS — BP 154/88 | HR 99 | Ht 62.0 in | Wt 164.0 lb

## 2022-07-09 DIAGNOSIS — R030 Elevated blood-pressure reading, without diagnosis of hypertension: Secondary | ICD-10-CM

## 2022-07-09 DIAGNOSIS — R0609 Other forms of dyspnea: Secondary | ICD-10-CM

## 2022-07-09 DIAGNOSIS — E7849 Other hyperlipidemia: Secondary | ICD-10-CM | POA: Diagnosis not present

## 2022-07-09 DIAGNOSIS — R072 Precordial pain: Secondary | ICD-10-CM

## 2022-07-09 DIAGNOSIS — R739 Hyperglycemia, unspecified: Secondary | ICD-10-CM

## 2022-07-09 DIAGNOSIS — I700 Atherosclerosis of aorta: Secondary | ICD-10-CM

## 2022-07-09 NOTE — Patient Instructions (Signed)
Medication Instructions:  - Your physician recommends that you continue on your current medications as directed. Please refer to the Current Medication list given to you today.  *If you need a refill on your cardiac medications before your next appointment, please call your pharmacy*   Lab Work: - none ordered  If you have labs (blood work) drawn today and your tests are completely normal, you will receive your results only by: [place] (if you have MyChart) OR A paper copy in the mail If you have any lab test that is abnormal or we need to change your treatment, we will call you to review the results.   Testing/Procedures: - none ordered   Follow-Up: At [facility], you and your health needs are our priority.  As part of our continuing mission to provide you with exceptional heart care, we have created designated Provider Care Teams.  These Care Teams include your primary Cardiologist (physician) and Advanced Practice Providers (APPs -  Physician Assistants and Nurse Practitioners) who all work together to provide you with the care you need, when you need it.  We recommend signing up for the patient portal called "MyChart".  Sign up information is provided on this After Visit Summary.  MyChart is used to connect with patients for Virtual Visits (Telemedicine).  Patients are able to view lab/test results, encounter notes, upcoming appointments, etc.  Non-urgent messages can be sent to your provider as well.   To learn more about what you can do with MyChart, go to [email or web address].    Your next appointment:   As needed   Provider:   Glenetta Hew, MD    Other Instructions N/a

## 2022-07-17 ENCOUNTER — Encounter: Payer: Self-pay | Admitting: Cardiology

## 2022-07-17 DIAGNOSIS — R030 Elevated blood-pressure reading, without diagnosis of hypertension: Secondary | ICD-10-CM | POA: Insufficient documentation

## 2022-07-17 NOTE — Assessment & Plan Note (Signed)
Very reassuring results on Coronary CTA.  Unlikely that her chest discomfort was related to coronary artery disease. Will consider more musculoskeletal etiology for pain.

## 2022-07-17 NOTE — Assessment & Plan Note (Signed)
This seems to be related to allergies as she is notably better after moving to different house.  Cardiac evaluation was negative.  Normal echo and normal Coronary CTA

## 2022-07-17 NOTE — Assessment & Plan Note (Signed)
BP was high today even on recheck.  She says that she was morning late getting here rushed to get and had had just sat down and they called her back.  There was a notable improvement on recheck, still elevated.  Would continue to monitor closely, initial evaluation BP was pretty normal.  Hopefully with lifestyle modifications for lipid control, this will also help her blood pressure control as well.  Again, she is hoping to avoid requiring medications

## 2022-07-17 NOTE — Assessment & Plan Note (Signed)
Mild aortic atherosclerosis noted, but no significant plaque noted in the coronaries.  Baseline cardiovascular risk factor modification warranted. She quit smoking.  Would closely follow blood pressure and lipid control.  She would like to try dietary and exercise modifications in lieu of medications for lipids.  Recommend increased exercise and diet change.

## 2022-07-17 NOTE — Assessment & Plan Note (Signed)
LDL was 149 with a total close up to 29.  Thankfully, Coronary Calcium Score is relatively low and there is only mild to moderate coronary plaque as well as aortic plaque noted.  Would probably shoot for an LDL least less than 100 with targeting less than 70.  She would prefer to avoid medications and would like to try lifestyle modification.  Discussed dietary changes and exercise with weight loss.  Would recommend follow-up labs with PCP after 6 to 9 months to see if there is been any benefit, if not we will probably consider intermittent therapy with rosuvastatin starting at moderate dose of 20 mg

## 2022-07-22 ENCOUNTER — Other Ambulatory Visit: Payer: Self-pay

## 2022-07-22 ENCOUNTER — Observation Stay: Payer: 59 | Admitting: Anesthesiology

## 2022-07-22 ENCOUNTER — Emergency Department: Payer: 59

## 2022-07-22 ENCOUNTER — Encounter: Payer: Self-pay | Admitting: Emergency Medicine

## 2022-07-22 ENCOUNTER — Observation Stay
Admission: EM | Admit: 2022-07-22 | Discharge: 2022-07-23 | Disposition: A | Discharge status: Home / Self Care | Payer: 59 | Attending: Surgery | Admitting: Surgery

## 2022-07-22 ENCOUNTER — Encounter
Admission: EM | Disposition: A | Discharge status: Home / Self Care | Payer: Self-pay | Source: Home / Self Care | Attending: Emergency Medicine

## 2022-07-22 DIAGNOSIS — Z1152 Encounter for screening for COVID-19: Secondary | ICD-10-CM | POA: Diagnosis not present

## 2022-07-22 DIAGNOSIS — R7989 Other specified abnormal findings of blood chemistry: Secondary | ICD-10-CM | POA: Diagnosis not present

## 2022-07-22 DIAGNOSIS — J45909 Unspecified asthma, uncomplicated: Secondary | ICD-10-CM | POA: Insufficient documentation

## 2022-07-22 DIAGNOSIS — Z87891 Personal history of nicotine dependence: Secondary | ICD-10-CM | POA: Diagnosis not present

## 2022-07-22 DIAGNOSIS — K810 Acute cholecystitis: Secondary | ICD-10-CM | POA: Diagnosis not present

## 2022-07-22 DIAGNOSIS — J449 Chronic obstructive pulmonary disease, unspecified: Secondary | ICD-10-CM | POA: Insufficient documentation

## 2022-07-22 DIAGNOSIS — K8012 Calculus of gallbladder with acute and chronic cholecystitis without obstruction: Principal | ICD-10-CM | POA: Insufficient documentation

## 2022-07-22 DIAGNOSIS — Z79899 Other long term (current) drug therapy: Secondary | ICD-10-CM | POA: Diagnosis not present

## 2022-07-22 DIAGNOSIS — K8000 Calculus of gallbladder with acute cholecystitis without obstruction: Secondary | ICD-10-CM | POA: Diagnosis not present

## 2022-07-22 DIAGNOSIS — R1011 Right upper quadrant pain: Secondary | ICD-10-CM | POA: Diagnosis present

## 2022-07-22 HISTORY — PX: ROBOTIC ASSISTED LAPAROSCOPIC CHOLECYSTECTOMY: SHX6521

## 2022-07-22 LAB — COMPREHENSIVE METABOLIC PANEL
ALT: 220 U/L — ABNORMAL HIGH (ref 0–44)
AST: 527 U/L — ABNORMAL HIGH (ref 15–41)
Albumin: 4.2 g/dL (ref 3.5–5.0)
Alkaline Phosphatase: 229 U/L — ABNORMAL HIGH (ref 38–126)
Anion gap: 9 (ref 5–15)
BUN: 24 mg/dL — ABNORMAL HIGH (ref 6–20)
CO2: 24 mmol/L (ref 22–32)
Calcium: 9.1 mg/dL (ref 8.9–10.3)
Chloride: 106 mmol/L (ref 98–111)
Creatinine, Ser: 0.92 mg/dL (ref 0.44–1.00)
GFR, Estimated: >60 mL/min (ref >60)
Glucose, Bld: 172 mg/dL — ABNORMAL HIGH (ref 70–99)
Potassium: 3.7 mmol/L (ref 3.5–5.1)
Sodium: 139 mmol/L (ref 135–145)
Total Bilirubin: 2 mg/dL — ABNORMAL HIGH (ref 0.3–1.2)
Total Protein: 7.5 g/dL (ref 6.5–8.1)

## 2022-07-22 LAB — URINALYSIS, ROUTINE W REFLEX MICROSCOPIC
Bilirubin Urine: NEGATIVE
Glucose, UA: NEGATIVE mg/dL
Hgb urine dipstick: NEGATIVE
Ketones, ur: NEGATIVE mg/dL
Leukocytes,Ua: NEGATIVE
Nitrite: NEGATIVE
Protein, ur: NEGATIVE mg/dL
Specific Gravity, Urine: 1.023 (ref 1.005–1.030)
pH: 6 (ref 5.0–8.0)

## 2022-07-22 LAB — RESP PANEL BY RT-PCR (RSV, FLU A&B, COVID)  RVPGX2
Influenza A by PCR: NEGATIVE
Influenza B by PCR: NEGATIVE
Resp Syncytial Virus by PCR: NEGATIVE
SARS Coronavirus 2 by RT PCR: NEGATIVE

## 2022-07-22 LAB — CBC
HCT: 45.1 % (ref 36.0–46.0)
Hemoglobin: 14 g/dL (ref 12.0–15.0)
MCH: 26.6 pg (ref 26.0–34.0)
MCHC: 31 g/dL (ref 30.0–36.0)
MCV: 85.6 fL (ref 80.0–100.0)
Platelets: 183 10*3/uL (ref 150–400)
RBC: 5.27 MIL/uL — ABNORMAL HIGH (ref 3.87–5.11)
RDW: 13.8 % (ref 11.5–15.5)
WBC: 4.1 10*3/uL (ref 4.0–10.5)
nRBC: 0 % (ref 0.0–0.2)

## 2022-07-22 LAB — TROPONIN I (HIGH SENSITIVITY): Troponin I (High Sensitivity): 5 ng/L

## 2022-07-22 LAB — LIPASE, BLOOD: Lipase: 28 U/L (ref 11–51)

## 2022-07-22 SURGERY — CHOLECYSTECTOMY, ROBOT-ASSISTED, LAPAROSCOPIC
Anesthesia: General

## 2022-07-22 MED ORDER — FENTANYL CITRATE (PF) 100 MCG/2ML IJ SOLN: 25.0000 ug | INTRAMUSCULAR | Status: DC | PRN

## 2022-07-22 MED ORDER — HYDROMORPHONE HCL 1 MG/ML IJ SOLN
0.5000 mg | INTRAMUSCULAR | Status: DC | PRN
  Administered 2022-07-22 – 2022-07-23 (×3): 1 mg via INTRAVENOUS
  Filled 2022-07-22 (×3): qty 1

## 2022-07-22 MED ORDER — DEXMEDETOMIDINE HCL IN NACL 80 MCG/20ML IV SOLN
INTRAVENOUS | Status: DC | PRN
  Administered 2022-07-22 (×2): 8 ug via BUCCAL

## 2022-07-22 MED ORDER — SODIUM CHLORIDE 0.9 % IV SOLN
2.0000 g | INTRAVENOUS | Status: DC
  Administered 2022-07-22: 2 g via INTRAVENOUS
  Filled 2022-07-22 (×2): qty 20

## 2022-07-22 MED ORDER — ONDANSETRON HCL 4 MG/2ML IJ SOLN
4.0000 mg | INTRAMUSCULAR | Status: AC
  Administered 2022-07-22: 4 mg via INTRAVENOUS
  Filled 2022-07-22: qty 2

## 2022-07-22 MED ORDER — FENTANYL CITRATE (PF) 100 MCG/2ML IJ SOLN
INTRAMUSCULAR | Status: AC
  Filled 2022-07-22: qty 2

## 2022-07-22 MED ORDER — EPINEPHRINE PF 1 MG/ML IJ SOLN
INTRAMUSCULAR | Status: AC
  Filled 2022-07-22: qty 1

## 2022-07-22 MED ORDER — IPRATROPIUM-ALBUTEROL 0.5-2.5 (3) MG/3ML IN SOLN
RESPIRATORY_TRACT | Status: AC
  Administered 2022-07-22: 3 mL via RESPIRATORY_TRACT
  Filled 2022-07-22: qty 3

## 2022-07-22 MED ORDER — SODIUM CHLORIDE 0.9 % IV SOLN: INTRAVENOUS | Status: DC

## 2022-07-22 MED ORDER — BUPIVACAINE HCL 0.25 % IJ SOLN
INTRAMUSCULAR | Status: DC | PRN
  Administered 2022-07-22: 20 mL

## 2022-07-22 MED ORDER — OXYCODONE HCL 5 MG/5ML PO SOLN: 5.0000 mg | Freq: Once | ORAL | Status: AC | PRN

## 2022-07-22 MED ORDER — LIDOCAINE HCL (CARDIAC) PF 100 MG/5ML IV SOSY
PREFILLED_SYRINGE | INTRAVENOUS | Status: DC | PRN
  Administered 2022-07-22: 100 mg via INTRAVENOUS

## 2022-07-22 MED ORDER — ACETAMINOPHEN 500 MG PO TABS
1000.0000 mg | ORAL_TABLET | Freq: Four times a day (QID) | ORAL | Status: DC
  Administered 2022-07-22 – 2022-07-23 (×2): 1000 mg via ORAL
  Filled 2022-07-22 (×3): qty 2

## 2022-07-22 MED ORDER — IPRATROPIUM-ALBUTEROL 0.5-2.5 (3) MG/3ML IN SOLN: 3.0000 mL | Freq: Once | RESPIRATORY_TRACT | Status: AC

## 2022-07-22 MED ORDER — ZOLPIDEM TARTRATE 5 MG PO TABS: 5.0000 mg | ORAL_TABLET | Freq: Every evening | ORAL | Status: DC | PRN

## 2022-07-22 MED ORDER — PROPOFOL 10 MG/ML IV BOLUS
INTRAVENOUS | Status: DC | PRN
  Administered 2022-07-22: 150 mg via INTRAVENOUS

## 2022-07-22 MED ORDER — ROCURONIUM BROMIDE 100 MG/10ML IV SOLN
INTRAVENOUS | Status: DC | PRN
  Administered 2022-07-22: 40 mg via INTRAVENOUS

## 2022-07-22 MED ORDER — INDOCYANINE GREEN 25 MG IV SOLR
2.5000 mg | INTRAVENOUS | Status: AC
  Administered 2022-07-22: 2.5 mg via INTRAVENOUS
  Filled 2022-07-22: qty 1

## 2022-07-22 MED ORDER — MIDAZOLAM HCL 2 MG/2ML IJ SOLN
INTRAMUSCULAR | Status: AC
  Filled 2022-07-22: qty 2

## 2022-07-22 MED ORDER — LACTATED RINGERS IV BOLUS
1000.0000 mL | Freq: Once | INTRAVENOUS | Status: AC
  Administered 2022-07-22: 1000 mL via INTRAVENOUS

## 2022-07-22 MED ORDER — SUGAMMADEX SODIUM 200 MG/2ML IV SOLN
INTRAVENOUS | Status: DC | PRN
  Administered 2022-07-22: 200 mg via INTRAVENOUS

## 2022-07-22 MED ORDER — MIDAZOLAM HCL 2 MG/2ML IJ SOLN
INTRAMUSCULAR | Status: DC | PRN
  Administered 2022-07-22: 2 mg via INTRAVENOUS

## 2022-07-22 MED ORDER — SUCCINYLCHOLINE CHLORIDE 200 MG/10ML IV SOSY
PREFILLED_SYRINGE | INTRAVENOUS | Status: DC | PRN
  Administered 2022-07-22: 100 mg via INTRAVENOUS

## 2022-07-22 MED ORDER — DEXAMETHASONE SODIUM PHOSPHATE 10 MG/ML IJ SOLN
INTRAMUSCULAR | Status: DC | PRN
  Administered 2022-07-22: 10 mg via INTRAVENOUS

## 2022-07-22 MED ORDER — HYDRALAZINE HCL 20 MG/ML IJ SOLN: 10.0000 mg | INTRAMUSCULAR | Status: DC | PRN

## 2022-07-22 MED ORDER — ONDANSETRON HCL 4 MG/2ML IJ SOLN: 4.0000 mg | Freq: Four times a day (QID) | INTRAMUSCULAR | Status: DC | PRN

## 2022-07-22 MED ORDER — FENTANYL CITRATE (PF) 100 MCG/2ML IJ SOLN
INTRAMUSCULAR | Status: DC | PRN
  Administered 2022-07-22: 25 ug via INTRAVENOUS
  Administered 2022-07-22: 75 ug via INTRAVENOUS

## 2022-07-22 MED ORDER — OXYCODONE HCL 5 MG PO TABS
5.0000 mg | ORAL_TABLET | ORAL | Status: DC | PRN
  Administered 2022-07-23: 10 mg via ORAL
  Filled 2022-07-22: qty 2

## 2022-07-22 MED ORDER — HYDROCODONE-ACETAMINOPHEN 5-325 MG PO TABS: 1.0000 | ORAL_TABLET | ORAL | Status: DC | PRN

## 2022-07-22 MED ORDER — DIPHENHYDRAMINE HCL 50 MG/ML IJ SOLN: 12.5000 mg | Freq: Four times a day (QID) | INTRAMUSCULAR | Status: DC | PRN

## 2022-07-22 MED ORDER — ONDANSETRON HCL 4 MG/2ML IJ SOLN: 4.0000 mg | Freq: Once | INTRAMUSCULAR | Status: DC | PRN

## 2022-07-22 MED ORDER — MORPHINE SULFATE (PF) 4 MG/ML IV SOLN
4.0000 mg | Freq: Once | INTRAVENOUS | Status: AC
  Administered 2022-07-22: 4 mg via INTRAVENOUS
  Filled 2022-07-22: qty 1

## 2022-07-22 MED ORDER — KETOROLAC TROMETHAMINE 30 MG/ML IJ SOLN
INTRAMUSCULAR | Status: DC | PRN
  Administered 2022-07-22: 30 mg via INTRAVENOUS

## 2022-07-22 MED ORDER — KETOROLAC TROMETHAMINE 30 MG/ML IJ SOLN
15.0000 mg | Freq: Once | INTRAMUSCULAR | Status: AC
  Administered 2022-07-22: 15 mg via INTRAVENOUS
  Filled 2022-07-22: qty 1

## 2022-07-22 MED ORDER — OXYCODONE HCL 5 MG PO TABS
ORAL_TABLET | ORAL | Status: AC
  Filled 2022-07-22: qty 1

## 2022-07-22 MED ORDER — OXYCODONE HCL 5 MG PO TABS
5.0000 mg | ORAL_TABLET | Freq: Once | ORAL | Status: AC | PRN
  Administered 2022-07-22: 5 mg via ORAL

## 2022-07-22 MED ORDER — ACETAMINOPHEN 10 MG/ML IV SOLN
INTRAVENOUS | Status: DC | PRN
  Administered 2022-07-22: 1000 mg via INTRAVENOUS

## 2022-07-22 MED ORDER — DIPHENHYDRAMINE HCL 12.5 MG/5ML PO ELIX: 12.5000 mg | ORAL_SOLUTION | Freq: Four times a day (QID) | ORAL | Status: DC | PRN

## 2022-07-22 MED ORDER — ONDANSETRON HCL 4 MG/2ML IJ SOLN
INTRAMUSCULAR | Status: DC | PRN
  Administered 2022-07-22: 4 mg via INTRAVENOUS

## 2022-07-22 MED ORDER — SODIUM CHLORIDE 0.9 % IR SOLN
Status: DC | PRN
  Administered 2022-07-22: 2000 mL

## 2022-07-22 MED ORDER — GADOBUTROL 1 MMOL/ML IV SOLN
7.0000 mL | Freq: Once | INTRAVENOUS | Status: AC | PRN
  Administered 2022-07-22: 7 mL via INTRAVENOUS

## 2022-07-22 MED ORDER — ONDANSETRON 4 MG PO TBDP: 4.0000 mg | ORAL_TABLET | Freq: Four times a day (QID) | ORAL | Status: DC | PRN

## 2022-07-22 MED ORDER — BUPIVACAINE LIPOSOME 1.3 % IJ SUSP
INTRAMUSCULAR | Status: AC
  Filled 2022-07-22: qty 20

## 2022-07-22 MED ORDER — BUPIVACAINE HCL (PF) 0.25 % IJ SOLN
INTRAMUSCULAR | Status: AC
  Filled 2022-07-22: qty 30

## 2022-07-22 MED ORDER — ACETAMINOPHEN 10 MG/ML IV SOLN
INTRAVENOUS | Status: AC
  Filled 2022-07-22: qty 100

## 2022-07-22 MED ORDER — ACETAMINOPHEN 10 MG/ML IV SOLN: 1000.0000 mg | Freq: Once | INTRAVENOUS | Status: DC | PRN

## 2022-07-22 MED ORDER — MIDAZOLAM HCL 2 MG/2ML IJ SOLN
1.0000 mg | Freq: Once | INTRAMUSCULAR | Status: AC | PRN
  Administered 2022-07-22: 1 mg via INTRAVENOUS
  Filled 2022-07-22: qty 2

## 2022-07-22 SURGICAL SUPPLY — 46 items
BAG PRESSURE INF REUSE 3000 (BAG) IMPLANT
CLIP LIGATING HEM O LOK PURPLE (MISCELLANEOUS) ×1 IMPLANT
COVER TIP SHEARS 8 DVNC (MISCELLANEOUS) ×1 IMPLANT
COVER TIP SHEARS 8MM DA VINCI (MISCELLANEOUS) ×1
DERMABOND ADVANCED .7 DNX12 (GAUZE/BANDAGES/DRESSINGS) ×1 IMPLANT
DRAPE ARM DVNC X/XI (DISPOSABLE) ×4 IMPLANT
DRAPE COLUMN DVNC XI (DISPOSABLE) ×1 IMPLANT
DRAPE DA VINCI XI ARM (DISPOSABLE) ×4
DRAPE DA VINCI XI COLUMN (DISPOSABLE) ×1
ELECT CAUTERY BLADE 6.4 (BLADE) ×1 IMPLANT
GLOVE ORTHO TXT STRL SZ7.5 (GLOVE) ×2 IMPLANT
GOWN STRL REUS W/ TWL LRG LVL3 (GOWN DISPOSABLE) ×2 IMPLANT
GOWN STRL REUS W/ TWL XL LVL3 (GOWN DISPOSABLE) ×2 IMPLANT
GOWN STRL REUS W/TWL LRG LVL3 (GOWN DISPOSABLE) ×2
GOWN STRL REUS W/TWL XL LVL3 (GOWN DISPOSABLE) ×1
GRASPER SUT TROCAR 14GX15 (MISCELLANEOUS) ×1 IMPLANT
IRRIGATION STRYKERFLOW (MISCELLANEOUS) IMPLANT
IRRIGATOR STRYKERFLOW (MISCELLANEOUS)
IRRIGATOR SUCT 8 DISP DVNC XI (IRRIGATION / IRRIGATOR) IMPLANT
IRRIGATOR SUCTION 8MM XI DISP (IRRIGATION / IRRIGATOR) ×1
IV NS IRRIG 3000ML ARTHROMATIC (IV SOLUTION) IMPLANT
KIT PINK PAD W/HEAD ARE REST (MISCELLANEOUS) ×1 IMPLANT
KIT PINK PAD W/HEAD ARM REST (MISCELLANEOUS) ×1 IMPLANT
KIT TURNOVER KIT A (KITS) ×1 IMPLANT
LABEL OR SOLS (LABEL) ×1 IMPLANT
MANIFOLD NEPTUNE II (INSTRUMENTS) ×1 IMPLANT
NDL INSUFFLATION 14GA 120MM (NEEDLE) ×1 IMPLANT
NEEDLE HYPO 22GX1.5 SAFETY (NEEDLE) ×1 IMPLANT
NEEDLE INSUFFLATION 14GA 120MM (NEEDLE) ×1 IMPLANT
NS IRRIG 500ML POUR BTL (IV SOLUTION) ×1 IMPLANT
PACK LAP CHOLECYSTECTOMY (MISCELLANEOUS) ×1 IMPLANT
SEAL CANN UNIV 5-8 DVNC XI (MISCELLANEOUS) ×4 IMPLANT
SEAL XI 5MM-8MM UNIVERSAL (MISCELLANEOUS) ×4
SET TUBE SMOKE EVAC HIGH FLOW (TUBING) ×1 IMPLANT
SOL ELECTROSURG ANTI STICK (MISCELLANEOUS) ×1
SOLUTION ELECTROSURG ANTI STCK (MISCELLANEOUS) ×1 IMPLANT
SPIKE FLUID TRANSFER (MISCELLANEOUS) ×1 IMPLANT
SUT MNCRL 4-0 (SUTURE) ×1
SUT MNCRL 4-0 27XMFL (SUTURE) ×1
SUT VICRYL 0 UR6 27IN ABS (SUTURE) ×1 IMPLANT
SUTURE MNCRL 4-0 27XMF (SUTURE) ×1 IMPLANT
SYS BAG RETRIEVAL 10MM (BASKET) ×1
SYSTEM BAG RETRIEVAL 10MM (BASKET) ×1 IMPLANT
TRAP FLUID SMOKE EVACUATOR (MISCELLANEOUS) ×1 IMPLANT
TROCAR Z-THREAD FIOS 11X100 BL (TROCAR) ×1 IMPLANT
WATER STERILE IRR 500ML POUR (IV SOLUTION) ×1 IMPLANT

## 2022-07-22 NOTE — ED Provider Notes (Signed)
[facility] Provider Note    Event Date/Time   First MD Initiated Contact with Patient 07/22/22 [phone]     (approximate)   History   Abdominal Pain   HPI  Jane Patrick is a 62 y.o. female who presents for evaluation of persistent abdominal pain over the last 3 to 4 days.  She said it started acutely within about 2 hours of eating dinner and has been persistent.  It would get better but then it gets much worse again and is very severe.  It typically gets worse after she tries to eat or drink anything although she has not had much to eat or drink for the last few days.  She has had nausea but no vomiting.  No diarrhea.  No fever, chest pain, nor shortness of breath.  She said the pain is in her upper and right upper part of her abdomen and occasionally radiates through to the back.  She has not had a cholecystectomy in the past.  She does not drink alcohol.     Physical Exam   Triage Vital Signs: ED Triage Vitals  Enc Vitals Group     BP 07/22/22 0203 (!) 158/80     Pulse Rate 07/22/22 0203 (!) 128     Resp 07/22/22 0203 (!) 22     Temp 07/22/22 0203 97.9 F (36.6 C)     Temp src --      SpO2 07/22/22 0203 98 %     Weight 07/22/22 0204 73.5 kg (162 lb)     Height 07/22/22 0204 1.575 m ('5\' 2"'$ )     Head Circumference --      Peak Flow --      Pain Score 07/22/22 0204 7     Pain Loc --      Pain Edu? --      Excl. in [place]? --     Most recent vital signs: Vitals:   07/22/22 0530 07/22/22 0600  BP: 124/71 120/69  Pulse: (!) 109 (!) 106  Resp: 20 (!) 22  Temp: 97.9 F (36.6 C)   SpO2: 93% 93%     General: Awake, no distress.  Appears uncomfortable but generally well and nontoxic. CV:  Good peripheral perfusion.  Tachycardia, regular rhythm. Resp:  Normal effort. Speaking easily and comfortably, no accessory muscle usage nor intercostal retractions.   Abd:  No distention.  Very tender to palpation in the epigastrium and right upper quadrant with  guarding and positive Murphy sign.  No lower abdominal tenderness although palpating the lower abdomen does cause some referred pain in her upper abdomen. Other:  Jaundice nor scleral icterus.  Awake, alert, oriented, pleasant and conversant, no altered mental status.   ED Results / Procedures / Treatments   Labs (all labs ordered are listed, but only abnormal results are displayed) Labs Reviewed  COMPREHENSIVE METABOLIC PANEL - Abnormal; Notable for the following components:      Result Value   Glucose, Bld 172 (*)    BUN 24 (*)    AST 527 (*)    ALT 220 (*)    Alkaline Phosphatase 229 (*)    Total Bilirubin 2.0 (*)    All other components within normal limits  CBC - Abnormal; Notable for the following components:   RBC 5.27 (*)    All other components within normal limits  URINALYSIS, ROUTINE W REFLEX MICROSCOPIC - Abnormal; Notable for the following components:   Color, Urine AMBER (*)  APPearance HAZY (*)    All other components within normal limits  RESP PANEL BY RT-PCR (RSV, FLU A&B, COVID)  RVPGX2  LIPASE, BLOOD  TROPONIN I (HIGH SENSITIVITY)     EKG  ED ECG REPORT I, Hinda Kehr, the attending physician, personally viewed and interpreted this ECG.  Date: 07/22/2022 EKG Time: 2:11 AM Rate: 123 Rhythm: Sinus tachycardia QRS Axis: normal Intervals: normal ST/T Wave abnormalities: normal Narrative Interpretation: no evidence of acute ischemia    RADIOLOGY I viewed and interpreted the patient's right upper quadrant ultrasound.  Gallbladder sludge, questionable wall thickening.  I also viewed and interpreted the MRCP.  No choledocholithiasis, questionable cholecystitis.    PROCEDURES:  Critical Care performed: No  Procedures   MEDICATIONS ORDERED IN ED: Medications  morphine (PF) 4 MG/ML injection 4 mg (4 mg Intravenous Given 07/22/22 0427)  ondansetron (ZOFRAN) injection 4 mg (4 mg Intravenous Given 07/22/22 0426)  ketorolac (TORADOL) 30 MG/ML  injection 15 mg (15 mg Intravenous Given 07/22/22 0426)  lactated ringers bolus 1,000 mL (0 mLs Intravenous Stopped 07/22/22 0600)  gadobutrol (GADAVIST) 1 MMOL/ML injection 7 mL (7 mLs Intravenous Contrast Given 07/22/22 0550)  midazolam (VERSED) injection 1 mg (1 mg Intravenous Given 07/22/22 0600)  lactated ringers bolus 1,000 mL (1,000 mLs Intravenous New Bag/Given 07/22/22 0648)     IMPRESSION / MDM / ASSESSMENT AND PLAN / ED COURSE  I reviewed the triage vital signs and the nursing notes.                              Differential diagnosis includes, but is not limited to, biliary disease (cholelithiasis/biliary colic, cholecystitis, choledocholithiasis, ascending cholangitis), pancreatitis, acid reflux, less likely ACS.  Patient's presentation is most consistent with acute presentation with potential threat to life or bodily function.  Labs/studies ordered: Right upper quadrant ultrasound, CMP, lipase, CBC, respiratory viral panel, high-sensitivity troponin, urinalysis Interventions/Medications given: Morphine 4 mg IV, Zofran 4 mg IV, Toradol 15 mg IV, LR 1 L IV bolus [facility] Course my include additional interventions not listed in this section:)  Patient has tachycardia around 120 but is not hypotensive.  No leukocytosis and normal lipase and normal urinalysis.  Initial high-sensitivity troponin is negative and EKG is reassuring, no indication for repeat EKG.  CMP is notable for elevated liver function tests including a bilirubin of 2 and AST of 527 and ALT of 220 in the absence of alcohol use.  Findings are very consistent with gallbladder disease.  Provided medications as listed above and will proceed with ultrasound.  Patient and family member agree with the plan.  The patient is on the cardiac monitor to evaluate for evidence of arrhythmia and/or significant heart rate changes.   Clinical Course as of 07/22/22 0735  Wed Jul 22, 2022  0542 US ABDOMEN LIMITED RUQ (LIVER/GB) I  viewed and interpreted the patient's ultrasound.  She has extensive biliary sludge but no discrete gallstones.  Her pain was well-controlled at the time so reportedly she did not have a positive sonographic Murphy sign.  Radiologist feels it is equivocal but not clearly cholecystitis.  I reassessed the patient and she is feeling much better after the pain medicine, still with some discomfort but improved from prior.  Still tachycardic at 115.  We again discussed the possibility of choledocholithiasis and I feel the best thing for the patient is to order an MRCP.  This should help give a definitive diagnosis about the possibility  of choledocholithiasis as well as shedding further light on the possibility of cholecystitis.  This will allow for ERCP versus surgical consultation and possible cholecystectomy.  I talked with the patient and her daughter about this and they understand and agree with the plan.  She does not currently need any additional analgesics. [CF]  0602 Ordering a second IV bolus of 1 L given her persistent tachycardia [CF]  0720 I viewed and interpreted the patient's MRCP.  No evidence of choledocholithiasis, questionable cholecystitis.  Confirmed by radiology.  I reassessed the patient and she is feeling better than initially but she still has some pain.  She is still mildly tachycardic at about 106.  Upon reexamination of her abdomen, she still has tenderness to palpation of the epigastrium and exquisite tenderness of the right upper quadrant with positive Murphy sign.  I talked with her about the previous options and she would like to proceed with surgery consultation which I think is appropriate.  I am paging Dr. Christian Mate for consultation. [CF]  0730 Consulted Dr. Christian Mate by phone.  He will Evaluate the patient and admit her for cholecystectomy. [CF]  [phone] Deferring antibiotics to the admitting service [CF]    Clinical Course User Index [CF] Hinda Kehr, MD     FINAL  CLINICAL IMPRESSION(S) / ED DIAGNOSES   Final diagnoses:  Acute cholecystitis  Elevated LFTs     Rx / DC Orders   ED Discharge Orders     None        Note:  This document was prepared using Dragon voice recognition software and may include unintentional dictation errors.   Hinda Kehr, MD 07/22/22 [phone]

## 2022-07-22 NOTE — Anesthesia Preprocedure Evaluation (Signed)
Anesthesia Evaluation  Patient identified by MRN, date of birth, ID band Patient awake    Reviewed: Allergy & Precautions, NPO status , Patient's Chart, lab work & pertinent test results  History of Anesthesia Complications Negative for: history of anesthetic complications  Airway Mallampati: II  TM Distance: >3 FB Neck ROM: Full    Dental  (+) Upper Dentures, Lower Dentures   Pulmonary asthma , neg sleep apnea, COPD, Patient abstained from smoking.Not current smoker, former smoker Occasional inhaler use   Pulmonary exam normal breath sounds clear to auscultation       Cardiovascular Exercise Tolerance: Good METS(-) hypertension(-) CAD and (-) Past MI negative cardio ROS (-) dysrhythmias  Rhythm:Regular Rate:Normal - Systolic murmurs    Neuro/Psych  Headaches PSYCHIATRIC DISORDERS  Depression       GI/Hepatic hiatal hernia,GERD  ,,(+)     (-) substance abuse    Endo/Other  neg diabetesHypothyroidism    Renal/GU negative Renal ROS     Musculoskeletal   Abdominal   Peds  Hematology   Anesthesia Other Findings Past Medical History: No date: Allergy No date: Asthma No date: COPD (chronic obstructive pulmonary disease) (HCC) No date: Generalized headaches No date: GERD (gastroesophageal reflux disease)  Reproductive/Obstetrics                             Anesthesia Physical Anesthesia Plan  ASA: 2  Anesthesia Plan: General   Post-op Pain Management: Ofirmev IV (intra-op)* and Toradol IV (intra-op)*   Induction: Intravenous and Rapid sequence  PONV Risk Score and Plan: 4 or greater and Ondansetron, Dexamethasone and Midazolam  Airway Management Planned: Oral ETT and Video Laryngoscope Planned  Additional Equipment: None  Intra-op Plan:   Post-operative Plan: Extubation in OR  Informed Consent: I have reviewed the patients History and Physical, chart, labs and discussed the  procedure including the risks, benefits and alternatives for the proposed anesthesia with the patient or authorized representative who has indicated his/her understanding and acceptance.     Dental advisory given  Plan Discussed with: CRNA and Surgeon  Anesthesia Plan Comments: (Discussed risks of anesthesia with patient, including PONV, sore throat, lip/dental/eye damage. Rare risks discussed as well, such as cardiorespiratory and neurological sequelae, and allergic reactions. Discussed the role of CRNA in patient's perioperative care. Patient understands.)       Anesthesia Quick Evaluation

## 2022-07-22 NOTE — ED Notes (Signed)
Preop tech here for transport. Pt a&ox4, ambulatory, resp even and unlab

## 2022-07-22 NOTE — Anesthesia Procedure Notes (Signed)
Procedure Name: Intubation Date/Time: 07/22/2022 1:37 PM  Performed by: Biagio Borg, CRNAPre-anesthesia Checklist: Patient identified, Emergency Drugs available, Suction available and Patient being monitored Patient Re-evaluated:Patient Re-evaluated prior to induction Oxygen Delivery Method: Circle system utilized Preoxygenation: Pre-oxygenation with 100% oxygen Induction Type: IV induction, Cricoid Pressure applied and Rapid sequence Laryngoscope Size: McGraph and 3 Grade View: Grade I Tube type: Oral Tube size: 7.0 mm Number of attempts: 1 Airway Equipment and Method: Stylet Placement Confirmation: ETT inserted through vocal cords under direct vision, positive ETCO2 and breath sounds checked- equal and bilateral Secured at: 20 cm Tube secured with: Tape Dental Injury: Teeth and Oropharynx as per pre-operative assessment

## 2022-07-22 NOTE — Discharge Instructions (Signed)
In addition to included general post-operative instructions,  Follow Up: Follow up with surgery in 2-3 weeks. Please remember to have labs drawn a few days prior to this appointment to recheck your bilirubin levels.   Diet: Resume home diet. Recommend avoiding or limiting fatty/greasy foods over the next few days/week. If you do eat these, you may (or may not) notice diarrhea. This is expected while your body adjusts to not having a gallbladder, and it typically resolves with time.    Activity: No heavy lifting >20 pounds (children, pets, laundry, garbage) or strenuous activity for 4 weeks, but light activity and walking are encouraged. Do not drive or drink alcohol if taking narcotic pain medications or having pain that might distract from driving.  Wound care: 2 days after surgery (03/01), you may shower/get incision wet with soapy water and pat dry (do not rub incisions), but no baths or submerging incision underwater until follow-up.   Medications: Resume all home medications. For mild to moderate pain: acetaminophen (Tylenol) or ibuprofen/naproxen (if no kidney disease). Combining Tylenol with alcohol can substantially increase your risk of causing liver disease. Narcotic pain medications, if prescribed, can be used for severe pain, though may cause nausea, constipation, and drowsiness. Do not combine Tylenol and Percocet (or similar) within a 6 hour period as Percocet (and similar) contain(s) Tylenol. If you do not need the narcotic pain medication, you do not need to fill the prescription.  Call office [phone] / [phone]) at any time if any questions, worsening pain, fevers/chills, bleeding, drainage from incision site, or other concerns.

## 2022-07-22 NOTE — Interval H&P Note (Signed)
History and Physical Interval Note:  07/22/2022 1:09 PM  Jane Patrick  has presented today for surgery, with the diagnosis of Cholecystitis.  The various methods of treatment have been discussed with the patient and family. After consideration of risks, benefits and other options for treatment, the patient has consented to  Procedure(s): XI ROBOTIC ASSISTED LAPAROSCOPIC CHOLECYSTECTOMY (N/A) [place] (ICG) (N/A) as a surgical intervention.  The patient's history has been reviewed, patient examined, no change in status, stable for surgery.  I have reviewed the patient's chart and labs.  Questions were answered to the patient's satisfaction.     Ronny Bacon

## 2022-07-22 NOTE — Op Note (Signed)
Robotic cholecystectomy with Indocyamine Green Ductal Imaging.   Pre-operative Diagnosis: Acute calculus cholecystitis  Post-operative Diagnosis:  Same.  Procedure: Robotic assisted laparoscopic cholecystectomy with Indocyamine Green Ductal Imaging.   Surgeon: Ronny Bacon, M.D., FACS  Anesthesia: General. with endotracheal tube  Findings: distended, thin walled edematous GB, with no icg contrast reaching main ducts, CD, or GB.   Estimated Blood Loss: 15 mL         Drains: None         Specimens: Gallbladder           Complications: none  Procedure Details  The patient was seen again in the Holding Room.  1.25 mg dose of ICG was administered intravenously.   The benefits, complications, treatment options, risks and expected outcomes were again reviewed with the patient. The likelihood of improving the patient's symptoms with return to their baseline status is good.  The patient and/or family concurred with the proposed plan, giving informed consent, again alternatives reviewed.  The patient was taken to Operating Room, identified, and the procedure verified as robotic assisted laparoscopic cholecystectomy.  Prior to the induction of general anesthesia, antibiotic prophylaxis was administered. VTE prophylaxis was in place. General endotracheal anesthesia was then administered and tolerated well. The patient was positioned in the supine position.  After the induction, the abdomen was prepped with Chloraprep and draped in the sterile fashion.  A Time Out was held and the above information confirmed.  After local infiltration of quarter percent Marcaine with epinephrine, stab incision was made left upper quadrant.  Just below the costal margin at Palmer's point, approximately midclavicular line the Veres needle is passed with sensation of the layers to penetrate the abdominal wall and into the peritoneum.  Saline drop test is confirmed peritoneal placement.  Insufflation is initiated with  carbon dioxide to pressures of 15 mmHg.  Right para-umbilical local infiltration with quarter percent Marcaine with epinephrine is utilized.  Made a 12 mm incision on the right periumbilical site, I advanced an optical 47m port under direct visualization into the peritoneal cavity.  Once the peritoneum was penetrated, insufflation was initiated.  The trocar was then advanced into the abdominal cavity under direct visualization. Pneumoperitoneum was then continued utilizing CO2 at 15 mmHg or less and tolerated well without any adverse changes in the patient's vital signs.  Two 8.5-mm ports were placed in the left lower quadrant and laterally, and one to the right lower quadrant, all under direct vision. All skin incisions  were infiltrated with a local anesthetic agent before making the incision and placing the trocars.  The patient was positioned  in reverse Trendelenburg, tilted the patient's left side down.  Da Vinci XI robot was then positioned on to the patient's left side, and docked.  The gallbladder was identified, the fundus grasped via the arm 4 Prograsp and retracted cephalad. Adhesions were lysed with scissors and cautery.  The infundibulum was identified grasped and retracted laterally, exposing the peritoneum overlying the triangle of Calot. This was then opened and dissected using cautery & scissors. An extended critical view of the cystic duct and cystic artery was obtained. The cystic duct was clearly identified and dissected to isolation.   Artery well isolated and clipped, and the cystic duct was triple clipped and divided with scissors, as close to the gallbladder neck as feasible, thus leaving two on the remaining stump.  The specimen side of the artery is sealed with bipolar and divided with monopolar scissors.   The gallbladder was  taken from the gallbladder fossa in a retrograde fashion with the electrocautery. The gallbladder was removed and placed in an Endocatch bag.  The liver  bed is inspected. Hemostasis was confirmed.  The robot was undocked and moved away from the operative field. > 1L NSS irrigation was utilized and was aspirated clear.  The gallbladder and Endocatch sac were then removed through the infraumbilical port site.   Inspection of the right upper quadrant was performed. No bleeding, bile duct injury or leak, or bowel injury was noted. The infra-umbilical port site fascia was closed with interrumpted 0 Vicryl sutures using PMI/cone under direct visualization. Pneumoperitoneum was released and ports removed.  4-0 subcuticular Monocryl was used to close the skin. Dermabond was  applied.  The patient was then extubated and brought to the recovery room in stable condition. Sponge, lap, and needle counts were correct at closure and at the conclusion of the case.               Ronny Bacon, M.D., [facility] 07/22/2022 3:01 PM

## 2022-07-22 NOTE — ED Triage Notes (Signed)
Pt presents via POV with complaints of RLQ pain that started 3 days ago with associated nausea. Pt endorses night sweats and fatigue over the last several days with associated SOB. No meds taken PTA. Rates the pain 7/10. Denies diarrhea, vomiting, urinary sx, CP or SOB.

## 2022-07-22 NOTE — ED Notes (Signed)
Report given to [facility], with same day surgery

## 2022-07-22 NOTE — ED Notes (Signed)
Surgical PA Carolanne Grumbling at pt bedside

## 2022-07-22 NOTE — H&P (Signed)
[facility] SURGICAL HISTORY & PHYSICAL (cpt [phone])  HISTORY OF PRESENT ILLNESS (HPI):  62 y.o. female presented to [facility] ED overnight for abdominal pain. Patient reports the acute onset of RUQ abdominal pain last night an hour or two after eating. Pain has been severe and persistent since. She reports associated chill and nausea with this. No fever, cough, CP, SOB, emesis, urinary change, bowel changes, nor juandice. She is unsure of any previous history of similar but thinks there may have been an episode a few years ago but was much more mild and resolved spontaneously. Only previous intra-abdominal surgery is an abdominal hysterectomy. Work up in the ED revealed a normal WBC at 4.1K, renal function was normal with sCr - 0.92, she did have AST and ALT elevation as well as hyperbilirubinemia to 2.0, and lipase was normal at 28. RUQ [place] was obtained and showed mild gallbladder wall thickening as well as sludge and stone. MRCP was obtained given hyperbilirubinemia and did not show choledocholithiasis.   General surgery is consulted by emergency medicine physician Dr Shirlean Kelly, MD for evaluation and management of acute cholecystitis.   PAST MEDICAL HISTORY (PMH):  Past Medical History:  Diagnosis Date   Allergy    Asthma    COPD (chronic obstructive pulmonary disease) (HCC)    Generalized headaches    GERD (gastroesophageal reflux disease)     Reviewed. Otherwise negative.   PAST SURGICAL HISTORY [facility]):  Past Surgical History:  Procedure Laterality Date   ABDOMINAL HYSTERECTOMY  2004   partial   BREAST SURGERY     biopsies   CT CTA CORONARY W/CA SCORE W/CM &/OR WO/CM  04/13/2022   No extracardiac abnormalities.  Coronary Calcium Score 0.  Nonobstructive mild disease in the LAD (25 to 49%).  Otherwise no obstructive coronary disease with a right dominant system.   ESOPHAGOGASTRODUODENOSCOPY (EGD) WITH PROPOFOL N/A 08/02/2018   Procedure: ESOPHAGOGASTRODUODENOSCOPY (EGD)  WITH PROPOFOL;  Surgeon: Lin Landsman, MD;  Location: [place];  Service: Gastroenterology;  Laterality: N/A;   FRACTURE SURGERY     rode in femur   POLYPECTOMY     vocal chord polyps   TRANSTHORACIC ECHOCARDIOGRAM  04/13/2022   Normal LV size and function.  EF 60 to 65%.  No RWMA.  GR 1 DD.  Normal RV.  Normal aortic and mitral valves.  Normal RAP.--NORMAL STUDY    Reviewed. Otherwise negative.   MEDICATIONS:  Prior to Admission medications   Medication Sig Start Date End Date Taking? Authorizing Provider  amphetamine-dextroamphetamine (ADDERALL) 10 MG tablet Take 1-2 tablets (10-20 mg total) by mouth 2 (two) times daily. 2 in am and 1 in pm 06/29/22   Steele Sizer, MD  [facility] ELLIPTA 62.5-25 MCG/ACT AEPB INHALE 1 PUFF INTO THE LUNGS DAILY 04/14/22   Steele Sizer, MD  cholecalciferol (VITAMIN D) 1000 UNITS tablet Take 2,000 Units by mouth daily.    [provider]  DULoxetine (CYMBALTA) 60 MG capsule TAKE 1 CAPSULE(60 MG) BY MOUTH DAILY 02/25/22   Ancil Boozer, Drue Stager, MD  Ipratropium-Albuterol (COMBIVENT RESPIMAT) 20-100 MCG/ACT AERS respimat Inhale 1 puff into the lungs every 6 (six) hours as needed for wheezing. 03/20/22   Steele Sizer, MD  levothyroxine (SYNTHROID) 25 MCG tablet TAKE 2 TABLETS BY MOUTH 4 DAYS A WEEK, AND 1 TABLET FOR 3 DAYS A WEEK. TAKE DAILY BEFORE BREAKFAST. 06/29/22   Steele Sizer, MD  omeprazole (PRILOSEC) 40 MG capsule TAKE 1 CAPSULE(40 MG) BY MOUTH DAILY 02/25/22   Steele Sizer, MD  QUEtiapine (SEROQUEL) 25 MG tablet Take 1 tablet (25 mg total) by mouth at bedtime. 06/29/22   Steele Sizer, MD  SUMAtriptan (IMITREX) 100 MG tablet Take 1 tablet (100 mg total) by mouth as needed for migraine. May repeat in 2 hours if headache persists or recurs. 02/04/21   Steele Sizer, MD  topiramate (TOPAMAX) 100 MG tablet Take 1 tablet (100 mg total) by mouth 2 (two) times daily. 06/29/22   Steele Sizer, MD  traMADol (ULTRAM) 50 MG tablet Take 1 tablet  by mouth 2 (two) times daily as needed. 01/07/17   Chasnis, Marland Kitchen, MD  Ubrogepant (UBRELVY) 100 MG TABS Take 1 tablet (100 mg total) by mouth daily as needed. 06/29/22   Steele Sizer, MD  valACYclovir (VALTREX) 500 MG tablet TAKE 2 TABLETS BY MOUTH TWICE DAILY FOR 10 DAYS 10/10/21   Steele Sizer, MD     ALLERGIES:  Allergies  Allergen Reactions   Codeine    Pantoprazole     Flu like syptoms     SOCIAL HISTORY:  Social History   Socioeconomic History   Marital status: Soil scientist    Spouse name: Not on file   Number of children: 2   Years of education: Not on file   Highest education level: Associate degree: occupational, Hotel manager, or vocational program  Occupational History   Occupation: Architectural technologist   Tobacco Use   Smoking status: Former    Packs/day: 1.00    Years: 31.00    Total pack years: 31.00    Types: Cigarettes    Start date: 05/30/1975    Quit date: 05/25/2004    Years since quitting: 18.1   Smokeless tobacco: Former  Scientific laboratory technician Use: Never used  Substance and Sexual Activity   Alcohol use: No    Alcohol/week: 0.0 standard drinks of alcohol   Drug use: No   Sexual activity: Yes    Partners: Male  Other Topics Concern   Not on file  Social History Narrative   Patient was laid off from May till Nov 2019, but is working again . Works from home for a [employer]    She is engaged    Social Determinants of Radio broadcast assistant Strain: Low Risk  (12/10/2017)   Overall Financial Resource Strain (CARDIA)    Difficulty of Paying Living Expenses: Not hard at all  Food Insecurity: No Food Insecurity (12/10/2017)   Hunger Vital Sign    Worried About Running Out of Food in the Last Year: Never true    [place] in the Last Year: Never true  Transportation Needs: No Transportation Needs (12/10/2017)   PRAPARE - Hydrologist (Medical): No    Lack of Transportation (Non-Medical): No  Physical  Activity: Sufficiently Active (12/10/2017)   Exercise Vital Sign    Days of Exercise per Week: 7 days    Minutes of Exercise per Session: 30 min  Stress: No Stress Concern Present (12/10/2017)   [place]    Feeling of Stress : Not at all  Social Connections: Somewhat Isolated (12/10/2017)   Social Connection and Isolation Panel [NHANES]    Frequency of Communication with Friends and Family: More than three times a week    Frequency of Social Gatherings with Friends and Family: More than three times a week    Attends Religious Services: Never    Marine scientist or Organizations:  Yes    Attends Club or Organization Meetings: More than 4 times per year    Marital Status: Widowed  Intimate Partner Violence: Not At Risk (12/10/2017)   Humiliation, Afraid, Rape, and Kick questionnaire    Fear of Current or Ex-Partner: No    Emotionally Abused: No    Physically Abused: No    Sexually Abused: No     FAMILY HISTORY:  Family History  Problem Relation Age of Onset   Colon cancer Father    Breast cancer Sister    Diverticulitis Brother    Diverticulitis Brother    Heart attack Maternal Grandmother 63    Otherwise negative.   REVIEW OF SYSTEMS:  Review of Systems  Constitutional:  Positive for chills. Negative for fever.  Respiratory:  Negative for cough and shortness of breath.   Cardiovascular:  Negative for chest pain and palpitations.  Gastrointestinal:  Positive for abdominal pain and nausea. Negative for constipation, diarrhea and vomiting.  Genitourinary:  Negative for dysuria and urgency.  All other systems reviewed and are negative.   VITAL SIGNS:  Temp:  [97.9 F (36.6 C)-99.4 F (37.4 C)] 99.4 F (37.4 C) (02/28 0945) Pulse Rate:  [97-128] 97 (02/28 0930) Resp:  [18-22] 22 (02/28 0930) BP: (101-158)/(63-83) 101/69 (02/28 0930) SpO2:  [90 %-98 %] 94 % (02/28 0930) Weight:  [73.5 kg] 73.5 kg (02/28  0204)     Height: '5\' 2"'$  (157.5 cm) Weight: 73.5 kg BMI (Calculated): 29.62   PHYSICAL EXAM:  Physical Exam Vitals and nursing note reviewed. Exam conducted with a chaperone present.  Constitutional:      General: She is not in acute distress.    Appearance: She is well-developed. She is not ill-appearing.     Comments: Patient resting in bed; NAD; daughter at bedside   HENT:     Head: Normocephalic and atraumatic.  Eyes:     General: No scleral icterus.    Extraocular Movements: Extraocular movements intact.  Cardiovascular:     Rate and Rhythm: Normal rate and regular rhythm.     Heart sounds: Normal heart sounds. No murmur heard. Pulmonary:     Effort: Pulmonary effort is normal. No respiratory distress.  Abdominal:     General: A surgical scar is present. There is no distension.     Palpations: Abdomen is soft.     Tenderness: There is abdominal tenderness in the right upper quadrant. There is no guarding or rebound. Positive signs include Murphy's sign.     Comments: Abdomen is soft, she is markedly tender in RUQ with positive Murphy's Sign, non-distended  Genitourinary:    Comments: Deferred Skin:    General: Skin is warm and dry.     Coloration: Skin is not jaundiced or pale.  Neurological:     General: No focal deficit present.     Mental Status: She is alert and oriented to person, place, and time.  Psychiatric:        Mood and Affect: Mood normal.        Behavior: Behavior normal.     INTAKE/OUTPUT:  This shift: No intake/output data recorded.  Last 2 shifts: '@IOLAST2SHIFTS'$ @  Labs:     Latest Ref Rng & Units 07/22/2022    2:08 AM 03/26/2022   10:02 AM 02/04/2021    9:16 AM  CBC  WBC 4.0 - 10.5 K/uL 4.1  4.2  4.4   Hemoglobin 12.0 - 15.0 g/dL 14.0  13.8  14.6   Hematocrit 36.0 - 46.0 %  45.1  43.5  44.6   Platelets 150 - 400 K/uL 183  189  196       Latest Ref Rng & Units 07/22/2022    2:08 AM 03/26/2022   10:02 AM 02/04/2021    9:16 AM  CMP  Glucose 70 -  99 mg/dL 172  92  132   BUN 6 - 20 mg/dL '24  20  20   '$ Creatinine 0.44 - 1.00 mg/dL 0.92  0.98  0.95   Sodium 135 - 145 mmol/L 139  143  142   Potassium 3.5 - 5.1 mmol/L 3.7  4.2  4.0   Chloride 98 - 111 mmol/L 106  108  106   CO2 22 - 32 mmol/L '24  26  27   '$ Calcium 8.9 - 10.3 mg/dL 9.1  9.4  9.5   Total Protein 6.5 - 8.1 g/dL 7.5  6.9  7.2   Total Bilirubin 0.3 - 1.2 mg/dL 2.0  0.4  0.4   Alkaline Phos 38 - 126 U/L 229     AST 15 - 41 U/L 527  10  13   ALT 0 - 44 U/L 220  12  13      Imaging studies:   RUQ [place] (07/22/2022) personally reviewed which shows gallbladder sludge and stone, some question of wall thickening, and radiologist report reviewed below:  IMPRESSION: Cholelithiasis and gallbladder sludge. Mild gallbladder wall thickening but no focal pain or pericholecystic edema typical of cholecystitis.   MRCP (07/22/2022) personally reviewed without evidence of choledocholithiasis, and radiologist report reviewed below:  IMPRESSION: 1. Cholelithiasis with layering sludge in the lumen of the gallbladder. No overt gallbladder wall thickening or substantial pericholecystic edema although trace mural edema is not excluded on T2 imaging. 2. No intra or extrahepatic biliary duct dilatation. No choledocholithiasis.   Assessment/Plan: (ICD-10's: K81.0) 62 y.o. female with acute cholecystitis.    - Will admit to general surgery - Plan for robotic assisted laparoscopic cholecystectomy this afternoon with Dr Christian Mate pending OR/Anesthesia availability - All risks, benefits, and alternatives to above procedure(s) were discussed with the patient and her family, all of their questions were answered to their expressed satisfaction, patient expresses she wishes to proceed, and informed consent was obtained.    - NPO (okay for sips with meds); IVF support   - IV Abx (Rocephin) - ICG on call to OR - Monitor abdominal examination - Pain control prn; antiemetics prn - Monitor  hyperbilirubinemia; MRCP negative - morning labs - Mobilize   - DVT prophylaxis; hold given plan for surgery   All of the above findings and recommendations were discussed with the patient and her family, and all of their questions were answered to their expressed satisfaction.  -- Edison Simon, PA-C [facility] 07/22/2022, 10:51 AM M-F: 7am - 4pm

## 2022-07-22 NOTE — Transfer of Care (Signed)
Immediate Anesthesia Transfer of Care Note  Patient: Jane Patrick  Procedure(s) Performed: XI ROBOTIC ASSISTED LAPAROSCOPIC CHOLECYSTECTOMY INDOCYANINE GREEN FLUORESCENCE IMAGING (ICG)  Patient Location: PACU  Anesthesia Type:General  Level of Consciousness: drowsy  Airway & Oxygen Therapy: Patient Spontanous Breathing and Patient connected to face mask oxygen  Post-op Assessment: Report given to RN and Post -op Vital signs reviewed and stable  Post vital signs: Reviewed and stable  Last Vitals:  Vitals Value Taken Time  BP 135/79 07/22/22 1502  Temp 36.7 C 07/22/22 1502  Pulse 90 07/22/22 1506  Resp 15 07/22/22 1506  SpO2 100 % 07/22/22 1506  Vitals shown include unvalidated device data.  Last Pain:  Vitals:   07/22/22 1304  TempSrc: Temporal  PainSc: 4          Complications: No notable events documented.

## 2022-07-22 NOTE — ED Notes (Signed)
Pt's daughter is leaving, states that her mother's friend will be coming to sit with her later.

## 2022-07-23 LAB — COMPREHENSIVE METABOLIC PANEL
ALT: 209 U/L — ABNORMAL HIGH (ref 0–44)
AST: 150 U/L — ABNORMAL HIGH (ref 15–41)
Albumin: 3.3 g/dL — ABNORMAL LOW (ref 3.5–5.0)
Alkaline Phosphatase: 170 U/L — ABNORMAL HIGH (ref 38–126)
Anion gap: 9 (ref 5–15)
BUN: 21 mg/dL — ABNORMAL HIGH (ref 6–20)
CO2: 22 mmol/L (ref 22–32)
Calcium: 8.2 mg/dL — ABNORMAL LOW (ref 8.9–10.3)
Chloride: 108 mmol/L (ref 98–111)
Creatinine, Ser: 0.81 mg/dL (ref 0.44–1.00)
GFR, Estimated: >60 mL/min (ref >60)
Glucose, Bld: 130 mg/dL — ABNORMAL HIGH (ref 70–99)
Potassium: 4 mmol/L (ref 3.5–5.1)
Sodium: 139 mmol/L (ref 135–145)
Total Bilirubin: 3.2 mg/dL — ABNORMAL HIGH (ref 0.3–1.2)
Total Protein: 6.4 g/dL — ABNORMAL LOW (ref 6.5–8.1)

## 2022-07-23 LAB — CBC
HCT: 35.4 % — ABNORMAL LOW (ref 36.0–46.0)
Hemoglobin: 11.3 g/dL — ABNORMAL LOW (ref 12.0–15.0)
MCH: 26.9 pg (ref 26.0–34.0)
MCHC: 31.9 g/dL (ref 30.0–36.0)
MCV: 84.3 fL (ref 80.0–100.0)
Platelets: 131 10*3/uL — ABNORMAL LOW (ref 150–400)
RBC: 4.2 MIL/uL (ref 3.87–5.11)
RDW: 14.5 % (ref 11.5–15.5)
WBC: 6.9 10*3/uL (ref 4.0–10.5)
nRBC: 0 % (ref 0.0–0.2)

## 2022-07-23 MED ORDER — ONDANSETRON 4 MG PO TBDP: 4.0000 mg | ORAL_TABLET | Freq: Four times a day (QID) | ORAL | 0 refills | Status: DC | PRN

## 2022-07-23 MED ORDER — OXYCODONE HCL 5 MG PO TABS: 5.0000 mg | ORAL_TABLET | Freq: Four times a day (QID) | ORAL | 0 refills | Status: DC | PRN

## 2022-07-23 MED ORDER — IBUPROFEN 600 MG PO TABS: 600.0000 mg | ORAL_TABLET | Freq: Four times a day (QID) | ORAL | 0 refills | Status: DC | PRN

## 2022-07-23 NOTE — TOC Initial Note (Signed)
Transition of Care [facility]) - Initial/Assessment Note    Patient Details  Name: Jane Patrick MRN: [id number] Date of Birth: [birth year]  Transition of Care [facility]) CM/SW Contact:    Beverly Sessions, RN Phone Number: 07/23/2022, 8:55 AM  Clinical Narrative:                   Transition of Care [facility]) Screening Note   Patient Details  Name: Jane Patrick Date of Birth: 10/31/1960   Transition of Care [facility]) CM/SW Contact:    Beverly Sessions, RN Phone Number: 07/23/2022, 8:56 AM    Transition of Care Department [facility]) has reviewed patient and no TOC needs have been identified at this time. We will continue to monitor patient advancement through interdisciplinary progression rounds. If new patient transition needs arise, please place a TOC consult.         Patient Goals and CMS Choice            Expected Discharge Plan and Services         Expected Discharge Date: 07/23/22                                    Prior Living Arrangements/Services                       Activities of Daily Living Home Assistive Devices/Equipment: None ADL Screening (condition at time of admission) Patient's cognitive ability adequate to safely complete daily activities?: Yes Is the patient deaf or have difficulty hearing?: No Does the patient have difficulty seeing, even when wearing glasses/contacts?: No Does the patient have difficulty concentrating, remembering, or making decisions?: No Patient able to express need for assistance with ADLs?: Yes Does the patient have difficulty dressing or bathing?: No Independently performs ADLs?: Yes (appropriate for developmental age) Does the patient have difficulty walking or climbing stairs?: No Weakness of Legs: None Weakness of Arms/Hands: None  Permission Sought/Granted                  Emotional Assessment              Admission diagnosis:  Acute cholecystitis [K81.0] Elevated LFTs [R79.89] Patient  Active Problem List   Diagnosis Date Noted   Acute cholecystitis 07/22/2022   Elevated blood pressure reading 07/17/2022   Dyslipidemia 06/29/2022   Hyperglycemia 06/29/2022   Dyspnea on minimal exertion 04/07/2022   Precordial pain 04/07/2022   Hyperlipidemia due to dietary fat intake 04/07/2022   Esophageal dysphagia    Hypothyroidism in adult 08/15/2017   Atherosclerosis of aorta ([place]) 09/22/2016   Hiatal hernia 09/22/2016   Abnormal liver CT 09/11/2016   H/O herpes simplex type 2 infection 08/20/2015   Hematuria 02/19/2015   B12 deficiency 02/19/2015   Attention deficit hyperactivity disorder (ADHD), predominantly inattentive type 11/13/2014   COPD, mild ([place]) 11/13/2014   Major depression in remission ([place]) 11/13/2014   History of basal cell carcinoma 11/13/2014   GERD without esophagitis 11/13/2014   History of abnormal cervical Pap smear 11/13/2014   Migraine without aura and without status migrainosus, not intractable 11/13/2014   Vitamin D deficiency 11/13/2014   Liver lesion 10/04/2010   PCP:  Steele Sizer, MD Pharmacy:   [facility] [id number] [place] [address] Phone: [phone] Fax:  [phone]     Social Determinants of Health (SDOH) Social History: SDOH Screenings   Food Insecurity: No Food Insecurity (07/22/2022)  Housing: Low Risk  (07/22/2022)  Transportation Needs: No Transportation Needs (07/22/2022)  Utilities: Not At Risk (07/22/2022)  Alcohol Screen: Low Risk  (05/17/2019)  Depression (PHQ2-9): Medium Risk (06/29/2022)  Financial Resource Strain: Low Risk  (12/10/2017)  Physical Activity: Sufficiently Active (12/10/2017)  Social Connections: Somewhat Isolated (12/10/2017)  Stress: No Stress Concern Present (12/10/2017)  Tobacco Use: Medium Risk (07/22/2022)   SDOH Interventions:     Readmission Risk Interventions     No data to display

## 2022-07-23 NOTE — Plan of Care (Signed)
[place]

## 2022-07-23 NOTE — Discharge Summary (Signed)
[facility] SURGICAL DISCHARGE SUMMARY  Patient ID: Jane Patrick MRN: [id number] DOB/AGE: Oct 05, 1960 62 y.o.  Admit date: 07/22/2022 Discharge date: 07/23/2022  Discharge Diagnoses Patient Active Problem List   Diagnosis Date Noted   Acute cholecystitis 07/22/2022    Consultants None  Procedures 07/22/2022:  Robotic assisted laparoscopic cholecystectomy   HPI: 62 y.o. female presented to [facility] ED overnight for abdominal pain. Patient reports the acute onset of RUQ abdominal pain last night an hour or two after eating. Pain has been severe and persistent since. She reports associated chill and nausea with this. No fever, cough, CP, SOB, emesis, urinary change, bowel changes, nor juandice. She is unsure of any previous history of similar but thinks there may have been an episode a few years ago but was much more mild and resolved spontaneously. Only previous intra-abdominal surgery is an abdominal hysterectomy. Work up in the ED revealed a normal WBC at 4.1K, renal function was normal with sCr - 0.92, she did have AST and ALT elevation as well as hyperbilirubinemia to 2.0, and lipase was normal at 28. RUQ [place] was obtained and showed mild gallbladder wall thickening as well as sludge and stone. MRCP was obtained given hyperbilirubinemia and did not show choledocholithiasis.   Hospital Course: Informed consent was obtained and documented, and patient underwent uneventful robotic assisted laparoscopic cholecystectomy (Dr Christian Mate, 07/22/2022).  Post-operatively, patient did well. Advancement of patient's diet and ambulation were well-tolerated. The remainder of patient's hospital course was essentially unremarkable, and discharge planning was initiated accordingly with patient safely able to be discharged home with appropriate discharge instructions, pain control, and outpatient follow-up after all of her questions were answered to her expressed satisfaction.  She did have  increase in bilirubin level this morning, likely secondary to inflammation related to surgery. MRI on admission was negative. I did review signs and symptoms of obstructive juandice including yellowing of her eyes or skin, itching, acholic stools, worsening abdominal pain, etc. She is very understanding. I will check her bilirubin level in follow up.    Discharge Condition: Good   Physical Examination:  Constitutional: Well appearing female, NAD HEENT: Mild scleral icterus Pulmonary: Normal effort, no respiratory distress Gastrointestinal: Soft, non-tender, non-distended, no rebound/guarding Skin: Laparoscopic incisions are CDI with dermabond, no erythema or drainage    Allergies as of 07/23/2022       Reactions   Codeine    Pantoprazole    Flu like syptoms        Medication List     TAKE these medications    amphetamine-dextroamphetamine 10 MG tablet Commonly known as: ADDERALL Take 1-2 tablets (10-20 mg total) by mouth 2 (two) times daily. 2 in am and 1 in pm   Anoro Ellipta 62.5-25 MCG/ACT Aepb Generic drug: umeclidinium-vilanterol INHALE 1 PUFF INTO THE LUNGS DAILY   cholecalciferol 1000 units tablet Commonly known as: VITAMIN D Take 2,000 Units by mouth daily.   Combivent Respimat 20-100 MCG/ACT Aers respimat Generic drug: Ipratropium-Albuterol Inhale 1 puff into the lungs every 6 (six) hours as needed for wheezing.   DULoxetine 60 MG capsule Commonly known as: CYMBALTA TAKE 1 CAPSULE(60 MG) BY MOUTH DAILY   ibuprofen 600 MG tablet Commonly known as: ADVIL Take 1 tablet (600 mg total) by mouth every 6 (six) hours as needed.   levothyroxine 25 MCG tablet Commonly known as: SYNTHROID TAKE 2 TABLETS BY MOUTH 4 DAYS A WEEK, AND 1 TABLET FOR 3 DAYS A WEEK. TAKE DAILY BEFORE BREAKFAST.   omeprazole  40 MG capsule Commonly known as: PRILOSEC TAKE 1 CAPSULE(40 MG) BY MOUTH DAILY   ondansetron 4 MG disintegrating tablet Commonly known as: ZOFRAN-ODT Take 1  tablet (4 mg total) by mouth every 6 (six) hours as needed for nausea.   oxyCODONE 5 MG immediate release tablet Commonly known as: Oxy IR/ROXICODONE Take 1 tablet (5 mg total) by mouth every 6 (six) hours as needed for severe pain or breakthrough pain.   QUEtiapine 25 MG tablet Commonly known as: SEROquel Take 1 tablet (25 mg total) by mouth at bedtime.   SUMAtriptan 100 MG tablet Commonly known as: IMITREX Take 1 tablet (100 mg total) by mouth as needed for migraine. May repeat in 2 hours if headache persists or recurs.   topiramate 100 MG tablet Commonly known as: TOPAMAX Take 1 tablet (100 mg total) by mouth 2 (two) times daily.   traMADol 50 MG tablet Commonly known as: ULTRAM Take 1 tablet by mouth 2 (two) times daily as needed.   Ubrelvy 100 MG Tabs Generic drug: Ubrogepant Take 1 tablet (100 mg total) by mouth daily as needed.   valACYclovir 500 MG tablet Commonly known as: VALTREX TAKE 2 TABLETS BY MOUTH TWICE DAILY FOR 10 DAYS          Follow-up Information     Tylene Fantasia, PA-C. Schedule an appointment as soon as possible for a visit in 3 week(s).   Specialty: Physician Assistant Why: s/p laparoscopic cholecystectomy Contact information: [address] [place] [address] [phone]                  Time spent on discharge management including discussion of hospital course, clinical condition, outpatient instructions, prescriptions, and follow up with the patient and members of the medical team: >30 minutes  -- Edison Simon , PA-C [facility]  07/23/2022, 8:37 AM [phone] M-F: 7am - 4pm

## 2022-07-23 NOTE — Progress Notes (Signed)
PATIENT EDUCATION COMPLETED INCLUDING MEDS, FOLLOW UP APPOINTMENTS, AND S/S OF CONCERN. IV REMOVED WITHOUT COMPLICATIONS. PATIENT AND PERSONAL BELONGINGS TRANSPORTED TO THE MEDICAL MALL EXIT ACCOMPANIED BY TRANSPORT VOLUNTEERS VIA WHEEL CHAIR. DISCHARGED TO THE CARE OF FAMILY IN PRIVATE VEHICLE, PATIENT IN STABLE CONDITION AT TIME OF DISCHARGE.

## 2022-07-24 ENCOUNTER — Telehealth: Payer: Self-pay

## 2022-07-24 LAB — SURGICAL PATHOLOGY

## 2022-07-24 NOTE — Transitions of Care (Post Inpatient/ED Visit) (Signed)
   07/24/2022  Name: Jane Patrick MRN: [id number] DOB: 30-Oct-1960  Today's TOC FU Call Status: Today's TOC FU Call Status:: Successful TOC FU Call Competed TOC FU Call Complete Date: 07/24/22  Transition Care Management Follow-up Telephone Call Date of Discharge: 08/21/22 Discharge Facility: [facility]) Type of Discharge: Inpatient Admission Primary Inpatient Discharge Diagnosis:: cholecystitis How have you been since you were released from the hospital?: Better Any questions or concerns?: No  Items Reviewed: Did you receive and understand the discharge instructions provided?: Yes Medications obtained and verified?: Yes (Medications Reviewed) Any new allergies since your discharge?: No Dietary orders reviewed?: Yes Do you have support at home?: Yes People in Home: child(ren), adult, friend(s)  Home Care and Equipment/Supplies: [place] Ordered?: NA Any new equipment or medical supplies ordered?: NA  Functional Questionnaire: Do you need assistance with bathing/showering or dressing?: No Do you need assistance with meal preparation?: No Do you need assistance with eating?: No Do you have difficulty maintaining continence: No Do you need assistance with getting out of bed/getting out of a chair/moving?: No Do you have difficulty managing or taking your medications?: No  Folllow up appointments reviewed: PCP Follow-up appointment confirmed?: NA Specialist Hospital Follow-up appointment confirmed?: Yes Date of Specialist follow-up appointment?: 08/06/22 Follow-Up Specialty Provider:: Edison Simon Do you need transportation to your follow-up appointment?: No Do you understand care options if your condition(s) worsen?: Yes-patient verbalized understanding    [place], [place] Nurse Health Advisor Direct Dial [phone]

## 2022-07-27 NOTE — Anesthesia Postprocedure Evaluation (Signed)
Anesthesia Post Note  Patient: Jane Patrick  Procedure(s) Performed: XI ROBOTIC ASSISTED LAPAROSCOPIC CHOLECYSTECTOMY INDOCYANINE GREEN FLUORESCENCE IMAGING (ICG)  Patient location during evaluation: PACU Anesthesia Type: General Level of consciousness: awake and alert Pain management: pain level controlled Vital Signs Assessment: post-procedure vital signs reviewed and stable Respiratory status: spontaneous breathing, nonlabored ventilation, respiratory function stable and patient connected to nasal cannula oxygen Cardiovascular status: blood pressure returned to baseline and stable Postop Assessment: no apparent nausea or vomiting Anesthetic complications: no   No notable events documented.   Last Vitals:  Vitals:   07/23/22 0412 07/23/22 0826  BP: 112/64 132/86  Pulse: 78 80  Resp: 16   Temp: 36.6 C 36.5 C  SpO2: 96% 93%    Last Pain:  Vitals:   07/23/22 0826  TempSrc: Oral  PainSc:                  Arita Miss

## 2022-07-31 ENCOUNTER — Other Ambulatory Visit: Payer: Self-pay

## 2022-07-31 ENCOUNTER — Telehealth: Payer: Self-pay | Admitting: Surgery

## 2022-07-31 DIAGNOSIS — K810 Acute cholecystitis: Secondary | ICD-10-CM

## 2022-07-31 NOTE — Telephone Encounter (Signed)
Spoke with Edison Simon, PA-C and he would like the patient to have a CMP done prior to her office visit. Patient notified of such and will have this done.

## 2022-07-31 NOTE — Telephone Encounter (Signed)
Patient had robotic cholecystectomy done on 07/22/22 with Dr. Christian Mate.  Patient states that when she was discharged she was told to have some repeat blood work done prior to her post op visit with [place] on 3/14.  Patient wasn't given any information regarding this, wants to know if still needs to have labs done prior to her post op.  Please call her. Thank you.

## 2022-08-04 ENCOUNTER — Other Ambulatory Visit
Admission: RE | Admit: 2022-08-04 | Discharge: 2022-08-04 | Disposition: A | Discharge status: Home / Self Care | Payer: 59 | Attending: Physician Assistant | Admitting: Physician Assistant

## 2022-08-04 DIAGNOSIS — K810 Acute cholecystitis: Secondary | ICD-10-CM

## 2022-08-04 LAB — COMPREHENSIVE METABOLIC PANEL
ALT: 19 U/L (ref 0–44)
AST: 14 U/L — ABNORMAL LOW (ref 15–41)
Albumin: 4.1 g/dL (ref 3.5–5.0)
Alkaline Phosphatase: 136 U/L — ABNORMAL HIGH (ref 38–126)
Anion gap: 10 (ref 5–15)
BUN: 17 mg/dL (ref 6–20)
CO2: 24 mmol/L (ref 22–32)
Calcium: 9.5 mg/dL (ref 8.9–10.3)
Chloride: 106 mmol/L (ref 98–111)
Creatinine, Ser: 0.77 mg/dL (ref 0.44–1.00)
GFR, Estimated: >60 mL/min (ref >60)
Glucose, Bld: 119 mg/dL — ABNORMAL HIGH (ref 70–99)
Potassium: 3.6 mmol/L (ref 3.5–5.1)
Sodium: 140 mmol/L (ref 135–145)
Total Bilirubin: 0.7 mg/dL (ref 0.3–1.2)
Total Protein: 7.9 g/dL (ref 6.5–8.1)

## 2022-08-06 ENCOUNTER — Ambulatory Visit: Payer: 59 | Admitting: Physician Assistant

## 2022-08-06 ENCOUNTER — Encounter: Payer: Self-pay | Admitting: Physician Assistant

## 2022-08-06 VITALS — BP 131/84 | HR 88 | Temp 98.0°F | Ht 62.0 in | Wt 157.0 lb

## 2022-08-06 DIAGNOSIS — Z09 Encounter for follow-up examination after completed treatment for conditions other than malignant neoplasm: Secondary | ICD-10-CM

## 2022-08-06 DIAGNOSIS — K8000 Calculus of gallbladder with acute cholecystitis without obstruction: Secondary | ICD-10-CM

## 2022-08-06 DIAGNOSIS — K810 Acute cholecystitis: Secondary | ICD-10-CM

## 2022-08-06 NOTE — Patient Instructions (Signed)

## 2022-08-06 NOTE — Progress Notes (Addendum)
[facility] POST-OP OFFICE VISIT  08/06/2022  HPI: Jane Patrick is a 62 y.o. female 15 days s/p robotic assisted laparoscopic cholecystectomy for acute cholecystitis with Dr Christian Mate  She is doing very well She had abdominal pain x1 week which is now resolved No fever, chills, nausea, emesis, nor diarrhea Tolerating PO Incisions well healed No other complaints   Repeat CMP reviewed - Bilirubin normalized at 0.7   Vital signs: BP 131/84   Pulse 88   Temp 98 F (36.7 C)   Ht '5\' 2"'$  (1.575 m)   Wt 157 lb (71.2 kg)   SpO2 97%   BMI 28.72 kg/m    Physical Exam: Constitutional: Well appearing female, NAD Abdomen: Soft, non-tender, non-distended, no rebound/guarding Skin: Laparoscopic incisions are healing well, no erythema or drainage   Assessment/Plan: This is a 63 y.o. female 15 days s/p robotic assisted laparoscopic cholecystectomy for acute cholecystitis with Dr Christian Mate   - Pain control prn  - Reviewed wound care recommendation  - Reviewed lifting restrictions; 4 weeks total  - Reviewed surgical pathology; acute on chronic cholecystitis  - She can follow up on as needed basis; She understands to call with questions/concerns  -- Edison Simon, PA-C [facility] 08/06/2022, 3:07 PM M-F: 7am - 4pm

## 2022-09-16 ENCOUNTER — Other Ambulatory Visit: Payer: Self-pay | Admitting: Family Medicine

## 2022-09-16 DIAGNOSIS — K219 Gastro-esophageal reflux disease without esophagitis: Secondary | ICD-10-CM

## 2022-09-16 DIAGNOSIS — Z9889 Other specified postprocedural states: Secondary | ICD-10-CM

## 2022-09-27 ENCOUNTER — Other Ambulatory Visit: Payer: Self-pay | Admitting: Family Medicine

## 2022-09-27 DIAGNOSIS — F5104 Psychophysiologic insomnia: Secondary | ICD-10-CM

## 2022-09-28 ENCOUNTER — Other Ambulatory Visit: Payer: Self-pay | Admitting: Family Medicine

## 2022-09-28 DIAGNOSIS — F900 Attention-deficit hyperactivity disorder, predominantly inattentive type: Secondary | ICD-10-CM

## 2022-09-29 MED ORDER — AMPHETAMINE-DEXTROAMPHETAMINE 10 MG PO TABS: 10.0000 mg | ORAL_TABLET | Freq: Two times a day (BID) | ORAL | 0 refills | Status: DC

## 2022-10-12 ENCOUNTER — Encounter: Payer: Self-pay | Admitting: Family Medicine

## 2022-11-07 ENCOUNTER — Other Ambulatory Visit: Payer: Self-pay | Admitting: Family Medicine

## 2022-11-07 DIAGNOSIS — J449 Chronic obstructive pulmonary disease, unspecified: Secondary | ICD-10-CM

## 2022-11-22 ENCOUNTER — Other Ambulatory Visit: Payer: Self-pay | Admitting: Family Medicine

## 2022-11-22 DIAGNOSIS — F321 Major depressive disorder, single episode, moderate: Secondary | ICD-10-CM

## 2022-11-23 ENCOUNTER — Other Ambulatory Visit: Payer: Self-pay

## 2022-11-23 DIAGNOSIS — F321 Major depressive disorder, single episode, moderate: Secondary | ICD-10-CM

## 2022-11-23 MED ORDER — DULOXETINE HCL 60 MG PO CPEP: ORAL_CAPSULE | ORAL | 1 refills | Status: DC

## 2022-11-24 ENCOUNTER — Other Ambulatory Visit: Payer: Self-pay | Admitting: Family Medicine

## 2022-11-24 DIAGNOSIS — F321 Major depressive disorder, single episode, moderate: Secondary | ICD-10-CM

## 2022-11-24 MED ORDER — DULOXETINE HCL 60 MG PO CPEP: ORAL_CAPSULE | ORAL | 1 refills | Status: DC

## 2022-11-24 NOTE — Telephone Encounter (Signed)
Resending as provider originally ordered on 11/23/22.  Requested Prescriptions  Pending Prescriptions Disp Refills   DULoxetine (CYMBALTA) 60 MG capsule 90 capsule 1    Sig: TAKE 1 CAPSULE(60 MG) BY MOUTH DAILY     Psychiatry: Antidepressants - SNRI - duloxetine Passed - 11/24/2022  3:18 PM      Passed - Cr in normal range and within 360 days    Creat  Date Value Ref Range Status  03/26/2022 0.98 0.50 - 1.05 mg/dL Final   Creatinine, Ser  Date Value Ref Range Status  08/04/2022 0.77 0.44 - 1.00 mg/dL Final         Passed - eGFR is 30 or above and within 360 days    GFR, Est African American  Date Value Ref Range Status  01/11/2020 68 > OR = 60 mL/min/1.79m2 Final   GFR, Est Non African American  Date Value Ref Range Status  01/11/2020 59 (L) > OR = 60 mL/min/1.93m2 Final   GFR, Estimated  Date Value Ref Range Status  08/04/2022 >60 >60 mL/min Final    Comment:    (NOTE) Calculated using the CKD-EPI Creatinine Equation (2021)    eGFR  Date Value Ref Range Status  03/26/2022 66 > OR = 60 mL/min/1.85m2 Final         Passed - Completed PHQ-2 or PHQ-9 in the last 360 days      Passed - Last BP in normal range    BP Readings from Last 1 Encounters:  08/06/22 131/84         Passed - Valid encounter within last 6 months    Recent Outpatient Visits           4 months ago COPD, mild [facility])   [place] [facility] Alba Cory, MD   8 months ago COPD, mild [facility])   [place] [facility] [place], Danna Hefty, MD   11 months ago Attention deficit hyperactivity disorder (ADHD), predominantly inattentive type   [facility] Alba Cory, MD   1 year ago COPD, mild [facility])   [place] [facility] Alba Cory, MD   1 year ago Attention deficit hyperactivity disorder (ADHD), predominantly inattentive type   [facility] Alba Cory, MD       Future Appointments              In 1 month Carlynn Purl, Danna Hefty, MD [facility], [facility]

## 2022-11-24 NOTE — Telephone Encounter (Signed)
Medication Refill - Medication: DULoxetine (CYMBALTA) 60 MG capsule  Has the patient contacted their pharmacy? Yes.   Medication is showing it was sent to the pharmacy yesterday, showing transmission failure, pharmacy does not have the medication. Could medication be resent to the pharmacy?    Preferred Pharmacy (with phone number or street name): [id number] [place], [place] - [address] AT [facility] OF [address] [place], [place] [address] Phone: [phone]  Fax: [phone]  Has the patient been seen for an appointment in the last year OR does the patient have an upcoming appointment? Yes.    Agent: Please be advised that RX refills may take up to 3 business days. We ask that you follow-up with your pharmacy.

## 2022-12-18 ENCOUNTER — Other Ambulatory Visit: Payer: Self-pay | Admitting: Family Medicine

## 2022-12-18 DIAGNOSIS — E039 Hypothyroidism, unspecified: Secondary | ICD-10-CM

## 2022-12-21 NOTE — Telephone Encounter (Signed)
Requested Prescriptions  Pending Prescriptions Disp Refills   levothyroxine (SYNTHROID) 25 MCG tablet [Pharmacy Med Name: LEVOTHYROXINE 0.025MG  ( ) TAB] 150 tablet 0    Sig: TAKE 2 TABLETS BY MOUTH 4 DAYS A WEEK AND 1 TABLET FOR 3 DAYS A WEEK, TAKE DAILY BEFORE BREAKFAST     Endocrinology:  Hypothyroid Agents Passed - 12/18/2022  6:01 PM      Passed - TSH in normal range and within 360 days    TSH  Date Value Ref Range Status  03/26/2022 3.22 0.40 - 4.50 mIU/L Final         Passed - Valid encounter within last 12 months    Recent Outpatient Visits           5 months ago COPD, mild [facility])   [place] [facility] Alba Cory, MD   9 months ago COPD, mild [facility])   [facility] Alba Cory, MD   1 year ago Attention deficit hyperactivity disorder (ADHD), predominantly inattentive type   [facility] Alba Cory, MD   1 year ago COPD, mild [facility])   [place] [facility] Alba Cory, MD   1 year ago Attention deficit hyperactivity disorder (ADHD), predominantly inattentive type   [facility] Alba Cory, MD       Future Appointments             In 1 week Alba Cory, MD [facility], [facility]

## 2022-12-28 NOTE — Progress Notes (Deleted)
Name: Jane Patrick   MRN: [id number]    DOB: Oct 30, 1960   Date:12/28/2022       Progress Note  Subjective  Chief Complaint  Follow up   HPI  ADD : she has been taking Adderal now, taking it prn, She states sometimes 1 or 2 pills mostly once a day, occasionally takes two pills. No side effects   SOB: resolved once she bought a new place with her daughter .   Chronic back pain:  she is seeing [name] and had MRI done 03/30/2019, and showed L2- 3  Disc bulge , and also a hypointense bone lesion, repeat Feb 2021 showed stability . She continues to have daily pain described as aching like and average 2/10 , she is taking Tramadol prn .   Migraine headache: she is on Topamax for prevention,  usually triggered by barometric pressure changes, but having episodes now due to trying to cut on caffeine . Episodes are about 2-  3 times a month but had one episode last week that lasted a few days.  Throbbing and tightness associated with photophobia occasionally nausea    Herpes Type II: she had a small lesion when she was seen by [name] and culture was positive for herpes, she was given Valtrex, no other outbreaks    COPD Mild: No longer smoking - quit about 13 years ago. She uses Anoro daily , she uses combivent prn very seldom now. She was more SOB and saw cardiologist but symptoms improved. Echo normal   B12 deficiency: found in June 2016, she is taking supplementation She also has vitamin D deficiency and is taking supplements    Major Depression Currently  Duloxetine, no side effects. She has difficulty staying asleep. We will try low dose Seroquel . She denies crying spells or change in appetite Phq 9 is positive today. Broke up with boyfriend, living with her daughter now, they have a new place    Hypothyroidism: she is taking levothyroxine, no hair loss, no change in bowel movements Last TSH at goal. She feels tired but states likely from poor sleep   Pre diabetes: Last A1C 6 % ,  discussed low carb diet and regular physical activity    Atherosclerosis of aorta: explained that is a risk of heart disease but she is still not ready to start statin therapy, she saw the cardiologist but she states they didn't discuss it. Discussed taking aspirin 81 mg daily    Dysphagia/GERD: seen by Dr. Allegra Lai and had dilation done 08/2018 and is doing well since No dysphagia as long ash she takes PPI daily   Abnormal mammogram: she had biopsy and per patient normal results.  Patient Active Problem List   Diagnosis Date Noted   Acute cholecystitis 07/22/2022   Elevated blood pressure reading 07/17/2022   Dyslipidemia 06/29/2022   Hyperglycemia 06/29/2022   Dyspnea on minimal exertion 04/07/2022   Precordial pain 04/07/2022   Hyperlipidemia due to dietary fat intake 04/07/2022   Esophageal dysphagia    Hypothyroidism in adult 08/15/2017   Atherosclerosis of aorta (HCC) 09/22/2016   Hiatal hernia 09/22/2016   Abnormal liver CT 09/11/2016   H/O herpes simplex type 2 infection 08/20/2015   Hematuria 02/19/2015   B12 deficiency 02/19/2015   Attention deficit hyperactivity disorder (ADHD), predominantly inattentive type 11/13/2014   COPD, mild (HCC) 11/13/2014   Major depression in remission (HCC) 11/13/2014   History of basal cell carcinoma 11/13/2014   GERD without esophagitis 11/13/2014  History of abnormal cervical Pap smear 11/13/2014   Migraine without aura and without status migrainosus, not intractable 11/13/2014   Vitamin D deficiency 11/13/2014   Liver lesion 10/04/2010    Past Surgical History:  Procedure Laterality Date   ABDOMINAL HYSTERECTOMY  2004   partial   BREAST SURGERY     biopsies   CT CTA CORONARY W/CA SCORE W/CM &/OR WO/CM  04/13/2022   No extracardiac abnormalities.  Coronary Calcium Score 0.  Nonobstructive mild disease in the LAD (25 to 49%).  Otherwise no obstructive coronary disease with a right dominant system.   ESOPHAGOGASTRODUODENOSCOPY (EGD)  WITH PROPOFOL N/A 08/02/2018   Procedure: ESOPHAGOGASTRODUODENOSCOPY (EGD) WITH PROPOFOL;  Surgeon: [name], MD;  [facility] END[facility];  Service: Gastroenterology;  Laterality: N/A;   FRACTURE SURGERY     rode in femur   POLYPECTOMY     vocal chord polyps   TRANSTHORACIC ECHOCARDIOGRAM  04/13/2022   Normal LV size and function.  EF 60 to 65%.  No RWMA.  GR 1 DD.  Normal RV.  Normal aortic and mitral valves.  Normal RAP.--NORMAL STUDY    Family History  Problem Relation Age of Onset   Colon cancer Father    Breast cancer Sister    Diverticulitis Brother    Diverticulitis Brother    Heart attack Maternal Grandmother 58    Social History   Tobacco Use   Smoking status: Former    Current packs/day: 0.00    Average packs/day: 1 pack/day for 31.0 years (31.0 ttl pk-yrs)    Types: Cigarettes    Start date: 05/30/1975    Quit date: 05/25/2004    Years since quitting: 18.6    Passive exposure: Past   Smokeless tobacco: Former  Substance Use Topics   Alcohol use: No    Alcohol/week: 0.0 standard drinks of alcohol     Current Outpatient Medications:    amphetamine-dextroamphetamine (ADDERALL) 10 MG tablet, Take 1-2 tablets (10-20 mg total) by mouth 2 (two) times daily. 2 in am and 1 in pm, Disp: 90 tablet, Rfl: 0   ANORO ELLIPTA 62.5-25 MCG/ACT AEPB, INHALE 1 PUFF INTO THE LUNGS DAILY, Disp: 180 each, Rfl: 1   cholecalciferol (VITAMIN D) 1000 UNITS tablet, Take 2,000 Units by mouth daily., Disp: , Rfl:    DULoxetine (CYMBALTA) 60 MG capsule, TAKE 1 CAPSULE(60 MG) BY MOUTH DAILY, Disp: 90 capsule, Rfl: 1   ibuprofen (ADVIL) 600 MG tablet, Take 1 tablet (600 mg total) by mouth every 6 (six) hours as needed., Disp: 30 tablet, Rfl: 0   Ipratropium-Albuterol (COMBIVENT RESPIMAT) 20-100 MCG/ACT AERS respimat, Inhale 1 puff into the lungs every 6 (six) hours as needed for wheezing., Disp: 12 g, Rfl: 1   levothyroxine (SYNTHROID) 25 MCG tablet, TAKE 2 TABLETS BY MOUTH 4 DAYS A  WEEK AND 1 TABLET FOR 3 DAYS A WEEK, TAKE DAILY BEFORE BREAKFAST, Disp: 150 tablet, Rfl: 0   omeprazole (PRILOSEC) 40 MG capsule, TAKE 1 CAPSULE(40 MG) BY MOUTH DAILY, Disp: 90 capsule, Rfl: 0   ondansetron (ZOFRAN-ODT) 4 MG disintegrating tablet, Take 1 tablet (4 mg total) by mouth every 6 (six) hours as needed for nausea., Disp: 20 tablet, Rfl: 0   QUEtiapine (SEROQUEL) 25 MG tablet, TAKE 1 TABLET(25 MG) BY MOUTH AT BEDTIME, Disp: 90 tablet, Rfl: 0   SUMAtriptan (IMITREX) 100 MG tablet, Take 1 tablet (100 mg total) by mouth as needed for migraine. May repeat in 2 hours if headache persists or recurs., Disp: 18  tablet, Rfl: 1   topiramate (TOPAMAX) 100 MG tablet, Take 1 tablet (100 mg total) by mouth 2 (two) times daily., Disp: 180 tablet, Rfl: 1   traMADol (ULTRAM) 50 MG tablet, Take 1 tablet by mouth 2 (two) times daily as needed., Disp: , Rfl:    Ubrogepant (UBRELVY) 100 MG TABS, Take 1 tablet (100 mg total) by mouth daily as needed., Disp: 16 tablet, Rfl: 2   valACYclovir (VALTREX) 500 MG tablet, TAKE 2 TABLETS BY MOUTH TWICE DAILY FOR 10 DAYS, Disp: 20 tablet, Rfl: 0  Allergies  Allergen Reactions   Codeine    Pantoprazole     Flu like syptoms    I personally reviewed {Reviewed:[id number]} with the patient/caregiver today.   ROS  ***  Objective  There were no vitals filed for this visit.  There is no height or weight on file to calculate BMI.  Physical Exam ***  No results found for this or any previous visit (from the past 2160 hour(s)).  Diabetic Foot Exam: Diabetic Foot Exam - Simple   No data filed    ***  PHQ2/9:    06/29/2022    7:52 AM 03/26/2022    9:11 AM 12/19/2021    8:01 AM 09/17/2021    2:03 PM 05/06/2021   11:14 AM  Depression screen PHQ 2/9  Decreased Interest 2 0 0 1 1  Down, Depressed, Hopeless 1 0 0 2 0  PHQ - 2 Score 3 0 0 3 1  Altered sleeping 3 0 0 3 3  Tired, decreased energy 3 0 0 3 3  Change in appetite 0 0 0 3 0  Feeling bad or failure  about yourself  0 0 0 0 0  Trouble concentrating 0 0 0 3 0  Moving slowly or fidgety/restless 0 0 0 0 0  Suicidal thoughts 0 0 0 0 0  PHQ-9 Score 9 0 0 15 7  Difficult doing work/chores  Not difficult at all Not difficult at all Somewhat difficult Somewhat difficult    phq 9 is {gen pos [id number]} ***  Fall Risk:    06/29/2022    7:52 AM 03/26/2022    9:11 AM 12/19/2021    8:01 AM 09/17/2021    1:59 PM 05/06/2021   11:14 AM  Fall Risk   Falls in the past year? 0 0 0 0 0  Number falls in past yr: 0 0 0  0  Injury with Fall? 0 0 0  0  Risk for fall due to : No Fall Risks No Fall Risks No Fall Risks No Fall Risks No Fall Risks  Follow up Falls prevention discussed Education provided;Falls evaluation completed;Falls prevention discussed Falls prevention discussed;Education provided Falls prevention discussed Falls prevention discussed   ***   Functional Status Survey:   ***   Assessment & Plan  *** There are no diagnoses linked to this encounter.

## 2022-12-29 ENCOUNTER — Ambulatory Visit: Payer: 59 | Admitting: Family Medicine

## 2022-12-29 DIAGNOSIS — Z23 Encounter for immunization: Secondary | ICD-10-CM

## 2023-01-06 ENCOUNTER — Ambulatory Visit: Payer: 59 | Admitting: Family Medicine

## 2023-01-06 ENCOUNTER — Encounter: Payer: Self-pay | Admitting: Family Medicine

## 2023-01-06 DIAGNOSIS — F900 Attention-deficit hyperactivity disorder, predominantly inattentive type: Secondary | ICD-10-CM | POA: Diagnosis not present

## 2023-01-06 DIAGNOSIS — J449 Chronic obstructive pulmonary disease, unspecified: Secondary | ICD-10-CM | POA: Diagnosis not present

## 2023-01-06 DIAGNOSIS — E039 Hypothyroidism, unspecified: Secondary | ICD-10-CM

## 2023-01-06 DIAGNOSIS — G43009 Migraine without aura, not intractable, without status migrainosus: Secondary | ICD-10-CM

## 2023-01-06 DIAGNOSIS — K219 Gastro-esophageal reflux disease without esophagitis: Secondary | ICD-10-CM

## 2023-01-06 DIAGNOSIS — Z8619 Personal history of other infectious and parasitic diseases: Secondary | ICD-10-CM

## 2023-01-06 DIAGNOSIS — F5104 Psychophysiologic insomnia: Secondary | ICD-10-CM

## 2023-01-06 DIAGNOSIS — Z9889 Other specified postprocedural states: Secondary | ICD-10-CM

## 2023-01-06 MED ORDER — LEVOTHYROXINE SODIUM 25 MCG PO TABS: ORAL_TABLET | ORAL | 0 refills | Status: DC

## 2023-01-06 MED ORDER — TOPIRAMATE 100 MG PO TABS: 100.0000 mg | ORAL_TABLET | Freq: Two times a day (BID) | ORAL | 1 refills | Status: DC

## 2023-01-06 MED ORDER — QUETIAPINE FUMARATE 25 MG PO TABS: 25.0000 mg | ORAL_TABLET | Freq: Every day | ORAL | 1 refills | Status: DC

## 2023-01-06 MED ORDER — AMPHETAMINE-DEXTROAMPHETAMINE 10 MG PO TABS: 10.0000 mg | ORAL_TABLET | Freq: Two times a day (BID) | ORAL | 0 refills | Status: DC

## 2023-01-06 MED ORDER — OMEPRAZOLE 40 MG PO CPDR: 40.0000 mg | DELAYED_RELEASE_CAPSULE | Freq: Every day | ORAL | 1 refills | Status: DC

## 2023-01-06 MED ORDER — VALACYCLOVIR HCL 500 MG PO TABS: ORAL_TABLET | ORAL | 0 refills | Status: DC

## 2023-01-06 MED ORDER — COMBIVENT RESPIMAT 20-100 MCG/ACT IN AERS: 1.0000 | INHALATION_SPRAY | Freq: Four times a day (QID) | RESPIRATORY_TRACT | 1 refills | Status: DC | PRN

## 2023-01-06 MED ORDER — UBRELVY 100 MG PO TABS: 1.0000 | ORAL_TABLET | Freq: Every day | ORAL | 2 refills | Status: DC | PRN
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
## 2023-01-11 ENCOUNTER — Other Ambulatory Visit: Payer: Self-pay | Admitting: Family Medicine

## 2023-03-22 ENCOUNTER — Ambulatory Visit: Payer: Self-pay

## 2023-03-22 NOTE — Telephone Encounter (Signed)
     Chief Complaint: Bilateral heel pain. Requests referral to podiatry. Instructed she needs OV - declines. "If Dr. Carlynn Purl tells me to come in I will." Symptoms: Above Frequency: Several months, but getting worse. Pertinent Negatives: Patient denies  Disposition: [] ED /[] Urgent Care (no appt availability in office) / [] Appointment(In office/virtual)/ []  [facility]/ [] Home Care/ [x] Refused Recommended Disposition /[] [facility]/ []  Follow-up with PCP Additional Notes: Please advise pt.  Reason for Disposition  [1] SEVERE pain (e.g., excruciating, unable to do any normal activities) AND [2] not improved after 2 hours of pain medicine  Answer Assessment - Initial Assessment Questions 1. ONSET: "When did the pain start?"      Few months, but getting worse 2. LOCATION: "Where is the pain located?"      Both heels 3. PAIN: "How bad is the pain?"    (Scale 1-10; or mild, moderate, severe)  - MILD (1-3): doesn't interfere with normal activities.   - MODERATE (4-7): interferes with normal activities (e.g., work or school) or awakens from sleep, limping.   - SEVERE (8-10): excruciating pain, unable to do any normal activities, unable to walk.      Severe 4. WORK OR EXERCISE: "Has there been any recent work or exercise that involved this part of the body?"      No 5. CAUSE: "What do you think is causing the foot pain?"     Unsure 6. OTHER SYMPTOMS: "Do you have any other symptoms?" (e.g., leg pain, rash, fever, numbness)     No 7. PREGNANCY: "Is there any chance you are pregnant?" "When was your last menstrual period?"     No  Protocols used: Foot Pain-A-AH

## 2023-03-22 NOTE — Progress Notes (Unsigned)
Name: Jane Patrick   MRN: [id number]    DOB: 13-Dec-1960   Date:03/23/2023       Progress Note  Subjective  Chief Complaint  Referral  I connected with  [name]  on 03/23/23 at  8:20 AM EDT by a video enabled telemedicine application and verified that I am speaking with the correct person using two identifiers.  I discussed the limitations of evaluation and management by telemedicine and the availability of in person appointments. The patient expressed understanding and agreed to proceed with the virtual visit  Staff also discussed with the patient that there may be a patient responsible charge related to this service. Patient Location: home Provider Location: [facility] Additional Individuals present: alone  HPI  Heel Pain ; she noticed about one month ago and is getting progressively worse. Pain is present when standing or walking. Worse in am's when she first stands up. She has changed shoes and also has taken tylenol without help. No swelling noticed by patient. She did not change level of activity or shoe wear prior to the pain  Patient Active Problem List   Diagnosis Date Noted   Dyslipidemia 06/29/2022   Hyperglycemia 06/29/2022   Precordial pain 04/07/2022   Hyperlipidemia due to dietary fat intake 04/07/2022   Esophageal dysphagia    Hypothyroidism in adult 08/15/2017   Atherosclerosis of aorta (HCC) 09/22/2016   Hiatal hernia 09/22/2016   Abnormal liver CT 09/11/2016   H/O herpes simplex type 2 infection 08/20/2015   Hematuria 02/19/2015   B12 deficiency 02/19/2015   Attention deficit hyperactivity disorder (ADHD), predominantly inattentive type 11/13/2014   COPD, mild (HCC) 11/13/2014   Major depression in remission (HCC) 11/13/2014   History of basal cell carcinoma 11/13/2014   GERD without esophagitis 11/13/2014   History of abnormal cervical Pap smear 11/13/2014   Migraine without aura and without status migrainosus, not intractable 11/13/2014   Vitamin D  deficiency 11/13/2014   Liver lesion 10/04/2010    Past Surgical History:  Procedure Laterality Date   ABDOMINAL HYSTERECTOMY  2004   partial   BREAST SURGERY     biopsies   CT CTA CORONARY W/CA SCORE W/CM &/OR WO/CM  04/13/2022   No extracardiac abnormalities.  Coronary Calcium Score 0.  Nonobstructive mild disease in the LAD (25 to 49%).  Otherwise no obstructive coronary disease with a right dominant system.   ESOPHAGOGASTRODUODENOSCOPY (EGD) WITH PROPOFOL N/A 08/02/2018   Procedure: ESOPHAGOGASTRODUODENOSCOPY (EGD) WITH PROPOFOL;  Surgeon: [name], MD;  Location: [facility] ENDOSCOPY;  Service: Gastroenterology;  Laterality: N/A;   FRACTURE SURGERY     rode in femur   POLYPECTOMY     vocal chord polyps   ROBOTIC ASSISTED LAPAROSCOPIC CHOLECYSTECTOMY  07/22/2022   TRANSTHORACIC ECHOCARDIOGRAM  04/13/2022   Normal LV size and function.  EF 60 to 65%.  No RWMA.  GR 1 DD.  Normal RV.  Normal aortic and mitral valves.  Normal RAP.--NORMAL STUDY    Family History  Problem Relation Age of Onset   Colon cancer Father    Breast cancer Sister    Diverticulitis Brother    Diverticulitis Brother    Heart attack Maternal Grandmother 87    Social History   Socioeconomic History   Marital status: Media planner    Spouse name: Not on file   Number of children: 2   Years of education: Not on file   Highest education level: Associate degree: occupational, Scientist, product/process development, or vocational program  Occupational History  Occupation: Doctor, general practice   Tobacco Use   Smoking status: Former    Current packs/day: 0.00    Average packs/day: 1 pack/day for 31.0 years (31.0 ttl pk-yrs)    Types: Cigarettes    Start date: 05/30/1975    Quit date: 05/25/2004    Years since quitting: 18.8    Passive exposure: Past   Smokeless tobacco: Former  Building services engineer status: Never Used  Substance and Sexual Activity   Alcohol use: No    Alcohol/week: 0.0 standard drinks of alcohol    Drug use: No   Sexual activity: Yes    Partners: Male  Other Topics Concern   Not on file  Social History Narrative   Not on file   Social Determinants of Health   Financial Resource Strain: Low Risk  (12/10/2017)   Overall Financial Resource Strain (CARDIA)    Difficulty of Paying Living Expenses: Not hard at all  Food Insecurity: No Food Insecurity (07/22/2022)   Hunger Vital Sign    Worried About Running Out of Food in the Last Year: Never true    Ran Out of Food in the Last Year: Never true  Transportation Needs: No Transportation Needs (07/22/2022)   PRAPARE - Administrator, Civil Service (Medical): No    Lack of Transportation (Non-Medical): No  Physical Activity: Sufficiently Active (12/10/2017)   Exercise Vital Sign    Days of Exercise per Week: 7 days    Minutes of Exercise per Session: 30 min  Stress: No Stress Concern Present (12/10/2017)   [employer] of Occupational Health - Occupational Stress Questionnaire    Feeling of Stress : Not at all  Social Connections: Somewhat Isolated (12/10/2017)   Social Connection and Isolation Panel [NHANES]    Frequency of Communication with Friends and Family: More than three times a week    Frequency of Social Gatherings with Friends and Family: More than three times a week    Attends Religious Services: Never    Database administrator or Organizations: Yes    Attends Engineer, structural: More than 4 times per year    Marital Status: Widowed  Intimate Partner Violence: Not At Risk (07/22/2022)   Humiliation, Afraid, Rape, and Kick questionnaire    Fear of Current or Ex-Partner: No    Emotionally Abused: No    Physically Abused: No    Sexually Abused: No     Current Outpatient Medications:    amphetamine-dextroamphetamine (ADDERALL) 10 MG tablet, Take 1-2 tablets (10-20 mg total) by mouth 2 (two) times daily. 2 in am and 1 in pm, Disp: 90 tablet, Rfl: 0   ANORO ELLIPTA 62.5-25 MCG/ACT AEPB, INHALE 1  PUFF INTO THE LUNGS DAILY, Disp: 180 each, Rfl: 1   cholecalciferol (VITAMIN D) 1000 UNITS tablet, Take 2,000 Units by mouth daily., Disp: , Rfl:    DULoxetine (CYMBALTA) 60 MG capsule, TAKE 1 CAPSULE(60 MG) BY MOUTH DAILY, Disp: 90 capsule, Rfl: 1   ibuprofen (ADVIL) 600 MG tablet, Take 1 tablet (600 mg total) by mouth every 6 (six) hours as needed., Disp: 30 tablet, Rfl: 0   Ipratropium-Albuterol (COMBIVENT RESPIMAT) 20-100 MCG/ACT AERS respimat, Inhale 1 puff into the lungs every 6 (six) hours as needed for wheezing., Disp: 12 g, Rfl: 1   levothyroxine (SYNTHROID) 25 MCG tablet, TAKE 2 TABLETS BY MOUTH 4 DAYS A WEEK, AND 1 TABLET FOR 3 DAYS A WEEK. TAKE DAILY BEFORE BREAKFAST., Disp: 150 tablet, Rfl: 0  omeprazole (PRILOSEC) 40 MG capsule, Take 1 capsule (40 mg total) by mouth daily., Disp: 90 capsule, Rfl: 1   QUEtiapine (SEROQUEL) 25 MG tablet, Take 1 tablet (25 mg total) by mouth at bedtime., Disp: 90 tablet, Rfl: 1   topiramate (TOPAMAX) 100 MG tablet, Take 1 tablet (100 mg total) by mouth 2 (two) times daily., Disp: 180 tablet, Rfl: 1   traMADol (ULTRAM) 50 MG tablet, Take 1 tablet by mouth 2 (two) times daily as needed., Disp: , Rfl:    Ubrogepant (UBRELVY) 100 MG TABS, Take 1 tablet (100 mg total) by mouth daily as needed., Disp: 16 tablet, Rfl: 2   valACYclovir (VALTREX) 500 MG tablet, TAKE 2 TABLETS BY MOUTH TWICE DAILY FOR 10 DAYS, Disp: 20 tablet, Rfl: 0  Allergies  Allergen Reactions   Codeine    Pantoprazole     Flu like syptoms    I personally reviewed active problem list, medication list, allergies, family history, social history, health maintenance with the patient/caregiver today.   ROS  Ten systems reviewed and is negative except as mentioned in HPI    Objective  Virtual encounter, vitals not obtained.   Physical Exam  Awake, alert and oriented She showed me her foot and did not seem read or swollen    PHQ2/9:    03/23/2023    7:49 AM 01/06/2023    7:43  AM 06/29/2022    7:52 AM 03/26/2022    9:11 AM 12/19/2021    8:01 AM  Depression screen PHQ 2/9  Decreased Interest 1 1 2  0 0  Down, Depressed, Hopeless 0 0 1 0 0  PHQ - 2 Score 1 1 3  0 0  Altered sleeping 1 3 3  0 0  Tired, decreased energy 1 3 3  0 0  Change in appetite 0 0 0 0 0  Feeling bad or failure about yourself  0 0 0 0 0  Trouble concentrating 0 3 0 0 0  Moving slowly or fidgety/restless 0 0 0 0 0  Suicidal thoughts 0 0 0 0 0  PHQ-9 Score 3 10 9  0 0  Difficult doing work/chores    Not difficult at all Not difficult at all   PHQ-2/9 Result is negative.    Fall Risk:    03/23/2023    7:48 AM 01/06/2023    7:43 AM 06/29/2022    7:52 AM 03/26/2022    9:11 AM 12/19/2021    8:01 AM  Fall Risk   Falls in the past year? 0 1 0 0 0  Number falls in past yr: 0 0 0 0 0  Injury with Fall? 0 1 0 0 0  Risk for fall due to : No Fall Risks No Fall Risks No Fall Risks No Fall Risks No Fall Risks  Follow up Falls prevention discussed Falls prevention discussed Falls prevention discussed Education provided;Falls evaluation completed;Falls prevention discussed Falls prevention discussed;Education provided     Assessment & Plan  1. Heel pain, bilateral  - Ambulatory referral to Podiatry   Discussed stretching, never go bear footed, roll on ice   I discussed the assessment and treatment plan with the patient. The patient was provided an opportunity to ask questions and all were answered. The patient agreed with the plan and demonstrated an understanding of the instructions.  The patient was advised to call back or seek an in-person evaluation if the symptoms worsen or if the condition fails to improve as anticipated.  I provided 15  minutes of  non-face-to-face time during this encounter.

## 2023-03-22 NOTE — Telephone Encounter (Signed)
Lvm for pt to call back and schedule an appt with either Denny Peon or Leisa . Dr Carlynn Purl is booked

## 2023-03-23 ENCOUNTER — Telehealth (INDEPENDENT_AMBULATORY_CARE_PROVIDER_SITE_OTHER): Payer: 59 | Admitting: Family Medicine

## 2023-03-23 ENCOUNTER — Encounter: Payer: Self-pay | Admitting: Family Medicine

## 2023-03-23 DIAGNOSIS — M79671 Pain in right foot: Secondary | ICD-10-CM | POA: Diagnosis not present

## 2023-03-23 DIAGNOSIS — M79672 Pain in left foot: Secondary | ICD-10-CM

## 2023-03-23 MED ORDER — CELECOXIB 100 MG PO CAPS: 100.0000 mg | ORAL_CAPSULE | Freq: Two times a day (BID) | ORAL | 0 refills | Status: DC

## 2023-03-30 ENCOUNTER — Ambulatory Visit (INDEPENDENT_AMBULATORY_CARE_PROVIDER_SITE_OTHER): Payer: 59

## 2023-03-30 ENCOUNTER — Ambulatory Visit: Payer: 59

## 2023-03-30 ENCOUNTER — Encounter: Payer: Self-pay | Admitting: Podiatry

## 2023-03-30 ENCOUNTER — Ambulatory Visit: Payer: 59 | Admitting: Podiatry

## 2023-03-30 VITALS — Ht 62.0 in | Wt 159.0 lb

## 2023-03-30 DIAGNOSIS — M79671 Pain in right foot: Secondary | ICD-10-CM

## 2023-03-30 DIAGNOSIS — M79672 Pain in left foot: Secondary | ICD-10-CM

## 2023-03-30 DIAGNOSIS — M722 Plantar fascial fibromatosis: Secondary | ICD-10-CM | POA: Diagnosis not present

## 2023-03-30 NOTE — Progress Notes (Signed)
Subjective:  Patient ID: Jane Patrick, female    DOB: [birth year],  MRN: [id number]  Chief Complaint  Patient presents with   Foot Pain    Pt is here for chronic heel pain more right than left foot, no injury Pt states its been hurting for a few months, hard to walk on throughtout the day.    62 y.o. female presents with the above complaint.  Patient presents with bilateral heel pain that has been on for quite some time is progressive gotten worse worse with ambulation worse with pressure and more for few months.  Hard to walk throughout the day.  She would like to discuss treatment option has not seen and was prior to seeing me for this.   Review of Systems: Negative except as noted in the HPI. Denies N/V/F/Ch.  Past Medical History:  Diagnosis Date   Allergy    Asthma    COPD (chronic obstructive pulmonary disease) (HCC)    Generalized headaches    GERD (gastroesophageal reflux disease)     Current Outpatient Medications:    amphetamine-dextroamphetamine (ADDERALL) 10 MG tablet, Take 1-2 tablets (10-20 mg total) by mouth 2 (two) times daily. 2 in am and 1 in pm, Disp: 90 tablet, Rfl: 0   ANORO ELLIPTA 62.5-25 MCG/ACT AEPB, INHALE 1 PUFF INTO THE LUNGS DAILY, Disp: 180 each, Rfl: 1   celecoxib (CELEBREX) 100 MG capsule, Take 1 capsule (100 mg total) by mouth 2 (two) times daily., Disp: 60 capsule, Rfl: 0   cholecalciferol (VITAMIN D) 1000 UNITS tablet, Take 2,000 Units by mouth daily., Disp: , Rfl:    DULoxetine (CYMBALTA) 60 MG capsule, TAKE 1 CAPSULE(60 MG) BY MOUTH DAILY, Disp: 90 capsule, Rfl: 1   ibuprofen (ADVIL) 600 MG tablet, Take 1 tablet (600 mg total) by mouth every 6 (six) hours as needed., Disp: 30 tablet, Rfl: 0   Ipratropium-Albuterol (COMBIVENT RESPIMAT) 20-100 MCG/ACT AERS respimat, Inhale 1 puff into the lungs every 6 (six) hours as needed for wheezing., Disp: 12 g, Rfl: 1   levothyroxine (SYNTHROID) 25 MCG tablet, TAKE 2 TABLETS BY MOUTH 4 DAYS A WEEK, AND 1 TABLET  FOR 3 DAYS A WEEK. TAKE DAILY BEFORE BREAKFAST., Disp: 150 tablet, Rfl: 0   omeprazole (PRILOSEC) 40 MG capsule, Take 1 capsule (40 mg total) by mouth daily., Disp: 90 capsule, Rfl: 1   QUEtiapine (SEROQUEL) 25 MG tablet, Take 1 tablet (25 mg total) by mouth at bedtime., Disp: 90 tablet, Rfl: 1   topiramate (TOPAMAX) 100 MG tablet, Take 1 tablet (100 mg total) by mouth 2 (two) times daily., Disp: 180 tablet, Rfl: 1   traMADol (ULTRAM) 50 MG tablet, Take 1 tablet by mouth 2 (two) times daily as needed., Disp: , Rfl:    Ubrogepant (UBRELVY) 100 MG TABS, Take 1 tablet (100 mg total) by mouth daily as needed., Disp: 16 tablet, Rfl: 2   valACYclovir (VALTREX) 500 MG tablet, TAKE 2 TABLETS BY MOUTH TWICE DAILY FOR 10 DAYS, Disp: 20 tablet, Rfl: 0  Social History   Tobacco Use  Smoking Status Former   Current packs/day: 0.00   Average packs/day: 1 pack/day for 31.0 years (31.0 ttl pk-yrs)   Types: Cigarettes   Start date: 05/30/1975   Quit date: 05/25/2004   Years since quitting: 18.8   Passive exposure: Past  Smokeless Tobacco Former    Allergies  Allergen Reactions   Codeine    Pantoprazole     Flu like syptoms   Objective:  There  were no vitals filed for this visit. Body mass index is 29.08 kg/m. Constitutional Well developed. Well nourished.  Vascular Dorsalis pedis pulses palpable bilaterally. Posterior tibial pulses palpable bilaterally. Capillary refill normal to all digits.  No cyanosis or clubbing noted. Pedal hair growth normal.  Neurologic Normal speech. Oriented to person, place, and time. Epicritic sensation to light touch grossly present bilaterally.  Dermatologic Nails well groomed and normal in appearance. No open wounds. No skin lesions.  Orthopedic: Normal joint ROM without pain or crepitus bilaterally. No visible deformities. Tender to palpation at the calcaneal tuber bilaterally. No pain with calcaneal squeeze bilaterally. Ankle ROM diminished range of motion  bilaterally. Silfverskiold Test: positive bilaterally.   Radiographs: Taken and reviewed. No acute fractures or dislocations. No evidence of stress fracture.  Plantar heel spur absent. Posterior heel spur absent.   Assessment:   1. Plantar fasciitis of right foot   2. Plantar fasciitis of left foot    Plan:  Patient was evaluated and treated and all questions answered.  Plantar Fasciitis, bilaterally - XR reviewed as above.  - Educated on icing and stretching. Instructions given.  - Injection delivered to the plantar fascia as below. - DME: Plantar fascial brace dispensed to support the medial longitudinal arch of the foot and offload pressure from the heel and prevent arch collapse during weightbearing - Pharmacologic management: None  Procedure: Injection Tendon/Ligament Location: Bilateral plantar fascia at the glabrous junction; medial approach. Skin Prep: alcohol Injectate: 0.5 cc 0.5% marcaine plain, 0.5 cc of 1% Lidocaine, 0.5 cc kenalog 10. Disposition: Patient tolerated procedure well. Injection site dressed with a band-aid.  No follow-ups on file.

## 2023-04-12 NOTE — Progress Notes (Unsigned)
Name: Jane Patrick   MRN: [id number]    DOB: April 18, 1961   Date:04/13/2023       Progress Note  Subjective  Chief Complaint  Follow Up  HPI  ADD : she has been taking Adderal now, taking it prn, She states sometimes 1 or 2 pills mostly once a day, occasionally takes two pills.  She notices inability to focus without medication and needs to take to perform at work . She is due for refills   Chronic back pain:  she is seeing [name] and had MRI done 03/30/2019, and showed L2- 3  Disc bulge , and also a hypointense bone lesion, repeat Feb 2021 showed stability . She continues to have daily pain described as aching like and average 4/10 , she is taking Tramadol prn . She fell months ago and had to get injection on left hip , pain resolved now   Plantar fascitis: both feet, seen by podiatrist, had injection and wearing a brace now   Migraine headache: she is on Topamax for prevention,  usually triggered by barometric pressure changes Bernita Raisin works well for her, however to take it daily due to weather changes. We will try adding Nurtec every other day, take ubrelvy prn only.  Throbbing and tightness associated with photophobia occasionally nausea. She does not like taking Sumatriptan .    Herpes Type II: she had a small lesion when she was seen by [name] and culture was positive for herpes, she was given Valtrex, no other outbreaks . Unchanged    COPD Mild: No longer smoking - quit about 14 years ago. She uses Anoro daily , she uses combivent prn very seldom now. No cough or wheezing, denies SOB at this time . She needs refills of medication    B12 deficiency: found in June 2016, she is taking supplementation She also has vitamin D deficiency and is taking supplements . We will recheck level   Major Depression Currently  Duloxetine, no side effects. She states Seroquel has helped with sleep. . She denies crying spells or change in appetite Phq 9 is positive . She bought a house with her  daughter. Getting along well but states having more migraines, does not feel depressed    Hypothyroidism: she is taking levothyroxine, no hair loss, no change in bowel movements Last TSH at goal. Continue current dose for now We will recheck labs today   Pre diabetes: Last A1C 6 % , we will recheck level, denies polyphagia, polydipsia and or polyuria    Atherosclerosis of aorta: explained that is a risk of heart disease, she wants to hold off on taking medication for now but will reconsider this Fall   Dysphagia/GERD: seen by Dr. Allegra Lai and had dilation done 08/2018 and is doing well since No dysphagia as long ash she takes PPI daily and symptoms have been controlling    Patient Active Problem List   Diagnosis Date Noted   Dyslipidemia 06/29/2022   Hyperglycemia 06/29/2022   Precordial pain 04/07/2022   Hyperlipidemia due to dietary fat intake 04/07/2022   Esophageal dysphagia    Hypothyroidism in adult 08/15/2017   Atherosclerosis of aorta (HCC) 09/22/2016   Hiatal hernia 09/22/2016   Abnormal liver CT 09/11/2016   H/O herpes simplex type 2 infection 08/20/2015   Hematuria 02/19/2015   B12 deficiency 02/19/2015   Attention deficit hyperactivity disorder (ADHD), predominantly inattentive type 11/13/2014   COPD, mild (HCC) 11/13/2014   Major depression in remission (HCC) 11/13/2014  History of basal cell carcinoma 11/13/2014   GERD without esophagitis 11/13/2014   History of abnormal cervical Pap smear 11/13/2014   Migraine without aura and without status migrainosus, not intractable 11/13/2014   Vitamin D deficiency 11/13/2014   Liver lesion 10/04/2010    Past Surgical History:  Procedure Laterality Date   ABDOMINAL HYSTERECTOMY  2004   partial   BREAST SURGERY     biopsies   CT CTA CORONARY W/CA SCORE W/CM &/OR WO/CM  04/13/2022   No extracardiac abnormalities.  Coronary Calcium Score 0.  Nonobstructive mild disease in the LAD (25 to 49%).  Otherwise no obstructive  coronary disease with a right dominant system.   ESOPHAGOGASTRODUODENOSCOPY (EGD) WITH PROPOFOL N/A 08/02/2018   Procedure: ESOPHAGOGASTRODUODENOSCOPY (EGD) WITH PROPOFOL;  Surgeon: [name], MD;  Location: [facility] END[facility]COPY;  Service: Gastroenterology;  Laterality: N/A;   FRACTURE SURGERY     rode in femur   POLYPECTOMY     vocal chord polyps   ROBOTIC ASSISTED LAPAROSCOPIC CHOLECYSTECTOMY  07/22/2022   TRANSTHORACIC ECHOCARDIOGRAM  04/13/2022   Normal LV size and function.  EF 60 to 65%.  No RWMA.  GR 1 DD.  Normal RV.  Normal aortic and mitral valves.  Normal RAP.--NORMAL STUDY    Family History  Problem Relation Age of Onset   Colon cancer Father    Breast cancer Sister    Diverticulitis Brother    Diverticulitis Brother    Heart attack Maternal Grandmother 23    Social History   Tobacco Use   Smoking status: Former    Current packs/day: 0.00    Average packs/day: 1 pack/day for 31.0 years (31.0 ttl pk-yrs)    Types: Cigarettes    Start date: 05/30/1975    Quit date: 05/25/2004    Years since quitting: 18.8    Passive exposure: Past   Smokeless tobacco: Former  Substance Use Topics   Alcohol use: No    Alcohol/week: 0.0 standard drinks of alcohol     Current Outpatient Medications:    amphetamine-dextroamphetamine (ADDERALL) 10 MG tablet, Take 1-2 tablets (10-20 mg total) by mouth 2 (two) times daily. 2 in am and one pm, Disp: 90 tablet, Rfl: 0   amphetamine-dextroamphetamine (ADDERALL) 10 MG tablet, Take 1-2 tablets (10-20 mg total) by mouth 2 (two) times daily. 2 in am and one in pm, Disp: 60 tablet, Rfl: 0   cholecalciferol (VITAMIN D) 1000 UNITS tablet, Take 2,000 Units by mouth daily., Disp: , Rfl:    Ipratropium-Albuterol (COMBIVENT RESPIMAT) 20-100 MCG/ACT AERS respimat, Inhale 1 puff into the lungs every 6 (six) hours as needed for wheezing., Disp: 12 g, Rfl: 1   omeprazole (PRILOSEC) 40 MG capsule, Take 1 capsule (40 mg total) by mouth daily., Disp: 90  capsule, Rfl: 1   QUEtiapine (SEROQUEL) 25 MG tablet, Take 1 tablet (25 mg total) by mouth at bedtime., Disp: 90 tablet, Rfl: 1   Rimegepant Sulfate (NURTEC) 75 MG TBDP, Take 1 tablet (75 mg total) by mouth every other day., Disp: 16 tablet, Rfl: 2   topiramate (TOPAMAX) 100 MG tablet, Take 1 tablet (100 mg total) by mouth 2 (two) times daily., Disp: 180 tablet, Rfl: 1   traMADol (ULTRAM) 50 MG tablet, Take 1 tablet by mouth 2 (two) times daily as needed., Disp: , Rfl:    amphetamine-dextroamphetamine (ADDERALL) 10 MG tablet, Take 1-2 tablets (10-20 mg total) by mouth 2 (two) times daily. 2 in am and 1 in pm, Disp: 90 tablet, Rfl: 0  ANORO ELLIPTA 62.5-25 MCG/ACT AEPB, Inhale 1 puff into the lungs daily., Disp: 180 each, Rfl: 1   DULoxetine (CYMBALTA) 60 MG capsule, TAKE 1 CAPSULE(60 MG) BY MOUTH DAILY, Disp: 90 capsule, Rfl: 1   levothyroxine (SYNTHROID) 25 MCG tablet, TAKE 2 TABLETS BY MOUTH 4 DAYS A WEEK, AND 1 TABLET FOR 3 DAYS A WEEK. TAKE DAILY BEFORE BREAKFAST., Disp: 150 tablet, Rfl: 0   Ubrogepant (UBRELVY) 100 MG TABS, Take 1 tablet (100 mg total) by mouth daily as needed., Disp: 16 tablet, Rfl: 2   valACYclovir (VALTREX) 500 MG tablet, TAKE 2 TABLETS BY MOUTH TWICE DAILY FOR 10 DAYS, Disp: 20 tablet, Rfl: 0  Allergies  Allergen Reactions   Codeine    Pantoprazole     Flu like syptoms    I personally reviewed active problem list, medication list, allergies, family history, social history, health maintenance with the patient/caregiver today.   ROS  Ten systems reviewed and is negative except as mentioned in HPI    Objective  Vitals:   04/13/23 0856  BP: 122/80  Pulse: 91  Resp: 14  Temp: 97.7 F (36.5 C)  TempSrc: Oral  SpO2: 98%  Weight: 159 lb 3.2 oz (72.2 kg)  Height: 5\' 2"  (1.575 m)    Body mass index is 29.12 kg/m.  Physical Exam  Constitutional: Patient appears well-developed and well-nourished.  No distress.  HEENT: head atraumatic, normocephalic,  pupils equal and reactive to light, neck supple Cardiovascular: Normal rate, regular rhythm and normal heart sounds.  No murmur heard. No BLE edema. Pulmonary/Chest: Effort normal and breath sounds normal. No respiratory distress. Abdominal: Soft.  There is no tenderness. Psychiatric: Patient has a normal mood and affect. behavior is normal. Judgment and thought content normal.    PHQ2/9:    04/13/2023    8:58 AM 03/23/2023    7:49 AM 01/06/2023    7:43 AM 06/29/2022    7:52 AM 03/26/2022    9:11 AM  Depression screen PHQ 2/9  Decreased Interest 0 1 1 2  0  Down, Depressed, Hopeless 1 0 0 1 0  PHQ - 2 Score 1 1 1 3  0  Altered sleeping 3 1 3 3  0  Tired, decreased energy 3 1 3 3  0  Change in appetite 2 0 0 0 0  Feeling bad or failure about yourself  0 0 0 0 0  Trouble concentrating 2 0 3 0 0  Moving slowly or fidgety/restless 0 0 0 0 0  Suicidal thoughts 0 0 0 0 0  PHQ-9 Score 11 3 10 9  0  Difficult doing work/chores Not difficult at all    Not difficult at all    phq 9 is positive   Fall Risk:    04/13/2023    8:57 AM 03/23/2023    7:48 AM 01/06/2023    7:43 AM 06/29/2022    7:52 AM 03/26/2022    9:11 AM  Fall Risk   Falls in the past year? 1 0 1 0 0  Number falls in past yr: 0 0 0 0 0  Injury with Fall? 1 0 1 0 0  Risk for fall due to : History of fall(s) No Fall Risks No Fall Risks No Fall Risks No Fall Risks  Follow up Education provided;Falls evaluation completed;Falls prevention discussed Falls prevention discussed Falls prevention discussed Falls prevention discussed Education provided;Falls evaluation completed;Falls prevention discussed      Functional Status Survey: Is the patient deaf or have difficulty hearing?: No Does  the patient have difficulty seeing, even when wearing glasses/contacts?: No Does the patient have difficulty concentrating, remembering, or making decisions?: Yes Does the patient have difficulty walking or climbing stairs?: Yes Does the patient  have difficulty dressing or bathing?: No Does the patient have difficulty doing errands alone such as visiting a doctor's office or shopping?: No    Assessment & Plan  1. COPD, mild (HCC)  - ANORO ELLIPTA 62.5-25 MCG/ACT AEPB; Inhale 1 puff into the lungs daily.  Dispense: 180 each; Refill: 1  2. Stage 3a chronic kidney disease (HCC)  - CBC with Differential/Platelet - COMPLETE METABOLIC PANEL WITH GFR  3. Atherosclerosis of aorta (HCC)  - Lipid panel - Lipoprotein A (LPA) - CRP High sensitivity  4. Major depression, recurrent, chronic (HCC)  - DULoxetine (CYMBALTA) 60 MG capsule; TAKE 1 CAPSULE(60 MG) BY MOUTH DAILY  Dispense: 90 capsule; Refill: 1  5. Migraine without aura and without status migrainosus, not intractable  - Ubrogepant (UBRELVY) 100 MG TABS; Take 1 tablet (100 mg total) by mouth daily as needed.  Dispense: 16 tablet; Refill: 2  6. Attention deficit hyperactivity disorder (ADHD), predominantly inattentive type  - amphetamine-dextroamphetamine (ADDERALL) 10 MG tablet; Take 1-2 tablets (10-20 mg total) by mouth 2 (two) times daily. 2 in am and 1 in pm  Dispense: 90 tablet; Refill: 0 - amphetamine-dextroamphetamine (ADDERALL) 10 MG tablet; Take 1-2 tablets (10-20 mg total) by mouth 2 (two) times daily. 2 in am and one pm  Dispense: 90 tablet; Refill: 0 - amphetamine-dextroamphetamine (ADDERALL) 10 MG tablet; Take 1-2 tablets (10-20 mg total) by mouth 2 (two) times daily. 2 in am and one in pm  Dispense: 60 tablet; Refill: 0  7. Hypothyroidism in adult  - TSH - levothyroxine (SYNTHROID) 25 MCG tablet; TAKE 2 TABLETS BY MOUTH 4 DAYS A WEEK, AND 1 TABLET FOR 3 DAYS A WEEK. TAKE DAILY BEFORE BREAKFAST.  Dispense: 150 tablet; Refill: 0  8. Breast cancer screening by mammogram  - MM 3D SCREENING MAMMOGRAM BILATERAL BREAST; Future  9. Need for immunization against influenza  - Flu vaccine trivalent PF, 6mos and older(Flulaval,Afluria,Fluarix,Fluzone)  10. B12  deficiency  - B12 and Folate Panel  11. Hyperglycemia  - Hemoglobin A1c  12. Dyslipidemia  - Lipoprotein A (LPA) - CRP High sensitivity  13. H/O herpes simplex type 2 infection  - valACYclovir (VALTREX) 500 MG tablet; TAKE 2 TABLETS BY MOUTH TWICE DAILY FOR 10 DAYS  Dispense: 20 tablet; Refill: 0

## 2023-04-13 ENCOUNTER — Encounter: Payer: Self-pay | Admitting: Family Medicine

## 2023-04-13 ENCOUNTER — Ambulatory Visit: Payer: 59 | Admitting: Family Medicine

## 2023-04-13 VITALS — BP 122/80 | HR 91 | Temp 97.7°F | Resp 14 | Ht 62.0 in | Wt 159.2 lb

## 2023-04-13 DIAGNOSIS — E785 Hyperlipidemia, unspecified: Secondary | ICD-10-CM

## 2023-04-13 DIAGNOSIS — J449 Chronic obstructive pulmonary disease, unspecified: Secondary | ICD-10-CM

## 2023-04-13 DIAGNOSIS — N1831 Chronic kidney disease, stage 3a: Secondary | ICD-10-CM | POA: Diagnosis not present

## 2023-04-13 DIAGNOSIS — I700 Atherosclerosis of aorta: Secondary | ICD-10-CM

## 2023-04-13 DIAGNOSIS — F339 Major depressive disorder, recurrent, unspecified: Secondary | ICD-10-CM

## 2023-04-13 DIAGNOSIS — E039 Hypothyroidism, unspecified: Secondary | ICD-10-CM

## 2023-04-13 DIAGNOSIS — R739 Hyperglycemia, unspecified: Secondary | ICD-10-CM

## 2023-04-13 DIAGNOSIS — G43009 Migraine without aura, not intractable, without status migrainosus: Secondary | ICD-10-CM

## 2023-04-13 DIAGNOSIS — E538 Deficiency of other specified B group vitamins: Secondary | ICD-10-CM

## 2023-04-13 DIAGNOSIS — Z23 Encounter for immunization: Secondary | ICD-10-CM

## 2023-04-13 DIAGNOSIS — Z8619 Personal history of other infectious and parasitic diseases: Secondary | ICD-10-CM

## 2023-04-13 DIAGNOSIS — Z1231 Encounter for screening mammogram for malignant neoplasm of breast: Secondary | ICD-10-CM

## 2023-04-13 DIAGNOSIS — F900 Attention-deficit hyperactivity disorder, predominantly inattentive type: Secondary | ICD-10-CM

## 2023-04-13 MED ORDER — VALACYCLOVIR HCL 500 MG PO TABS: ORAL_TABLET | ORAL | 0 refills | Status: DC

## 2023-04-13 MED ORDER — UBRELVY 100 MG PO TABS: 1.0000 | ORAL_TABLET | Freq: Every day | ORAL | 2 refills | Status: DC | PRN

## 2023-04-13 MED ORDER — AMPHETAMINE-DEXTROAMPHETAMINE 10 MG PO TABS: 10.0000 mg | ORAL_TABLET | Freq: Two times a day (BID) | ORAL | 0 refills | Status: DC

## 2023-04-13 MED ORDER — ANORO ELLIPTA 62.5-25 MCG/ACT IN AEPB: 1.0000 | INHALATION_SPRAY | Freq: Every day | RESPIRATORY_TRACT | 1 refills | Status: DC

## 2023-04-13 MED ORDER — DULOXETINE HCL 60 MG PO CPEP: ORAL_CAPSULE | ORAL | 1 refills | Status: DC

## 2023-04-13 MED ORDER — NURTEC 75 MG PO TBDP: 1.0000 | ORAL_TABLET | ORAL | 2 refills | Status: DC

## 2023-04-13 MED ORDER — LEVOTHYROXINE SODIUM 25 MCG PO TABS: ORAL_TABLET | ORAL | 0 refills | Status: DC

## 2023-04-15 ENCOUNTER — Encounter: Payer: Self-pay | Admitting: Family Medicine

## 2023-04-16 ENCOUNTER — Other Ambulatory Visit: Payer: Self-pay | Admitting: Family Medicine

## 2023-04-19 LAB — LIPID PANEL
Cholesterol: 211 mg/dL — ABNORMAL HIGH (ref <200)
HDL: 57 mg/dL (ref >=50)
LDL Cholesterol (Calc): 133 mg/dL — ABNORMAL HIGH
Non-HDL Cholesterol (Calc): 154 mg/dL — ABNORMAL HIGH (ref <130)
Total CHOL/HDL Ratio: 3.7 (calc) (ref <5.0)
Triglycerides: 100 mg/dL (ref <150)

## 2023-04-19 LAB — CBC WITH DIFFERENTIAL/PLATELET
Absolute Lymphocytes: 1720 {cells}/uL (ref 850–3900)
Absolute Monocytes: 384 {cells}/uL (ref 200–950)
Basophils Absolute: 31 {cells}/uL (ref 0–200)
Basophils Relative: 0.5 %
Eosinophils Absolute: 110 {cells}/uL (ref 15–500)
Eosinophils Relative: 1.8 %
HCT: 43.6 % (ref 35.0–45.0)
Hemoglobin: 13.9 g/dL (ref 11.7–15.5)
MCH: 26.4 pg — ABNORMAL LOW (ref 27.0–33.0)
MCHC: 31.9 g/dL — ABNORMAL LOW (ref 32.0–36.0)
MCV: 82.9 fL (ref 80.0–100.0)
MPV: 9.6 fL (ref 7.5–12.5)
Monocytes Relative: 6.3 %
Neutro Abs: 3855 {cells}/uL (ref 1500–7800)
Neutrophils Relative %: 63.2 %
Platelets: 231 10*3/uL (ref 140–400)
RBC: 5.26 10*6/uL — ABNORMAL HIGH (ref 3.80–5.10)
RDW: 14.4 % (ref 11.0–15.0)
Total Lymphocyte: 28.2 %
WBC: 6.1 10*3/uL (ref 3.8–10.8)

## 2023-04-19 LAB — COMPLETE METABOLIC PANEL WITH GFR
AG Ratio: 1.8 (calc) (ref 1.0–2.5)
ALT: 9 U/L (ref 6–29)
AST: 9 U/L — ABNORMAL LOW (ref 10–35)
Albumin: 4.3 g/dL (ref 3.6–5.1)
Alkaline phosphatase (APISO): 110 U/L (ref 37–153)
BUN/Creatinine Ratio: 27 (calc) — ABNORMAL HIGH (ref 6–22)
BUN: 30 mg/dL — ABNORMAL HIGH (ref 7–25)
CO2: 29 mmol/L (ref 20–32)
Calcium: 9.2 mg/dL (ref 8.6–10.4)
Chloride: 106 mmol/L (ref 98–110)
Creat: 1.1 mg/dL — ABNORMAL HIGH (ref 0.50–1.05)
Globulin: 2.4 g/dL (ref 1.9–3.7)
Glucose, Bld: 106 mg/dL — ABNORMAL HIGH (ref 65–99)
Potassium: 4 mmol/L (ref 3.5–5.3)
Sodium: 141 mmol/L (ref 135–146)
Total Bilirubin: 0.4 mg/dL (ref 0.2–1.2)
Total Protein: 6.7 g/dL (ref 6.1–8.1)
eGFR: 57 mL/min/{1.73_m2} — ABNORMAL LOW (ref >=60)

## 2023-04-19 LAB — HEMOGLOBIN A1C
Hgb A1c MFr Bld: 6.1 %{Hb} — ABNORMAL HIGH (ref <5.7)
Mean Plasma Glucose: 128 mg/dL
eAG (mmol/L): 7.1 mmol/L

## 2023-04-19 LAB — TSH: TSH: 3.43 m[IU]/L (ref 0.40–4.50)

## 2023-04-19 LAB — B12 AND FOLATE PANEL
Folate: 7.4 ng/mL
Vitamin B-12: 368 pg/mL (ref 200–1100)

## 2023-04-19 LAB — LIPOPROTEIN A (LPA): Lipoprotein (a): <10 nmol/L (ref <75)

## 2023-04-19 LAB — HIGH SENSITIVITY CRP: hs-CRP: 8.4 mg/L — ABNORMAL HIGH

## 2023-04-29 ENCOUNTER — Encounter: Payer: Self-pay | Admitting: Podiatry

## 2023-04-29 ENCOUNTER — Ambulatory Visit: Payer: 59 | Admitting: Podiatry

## 2023-04-29 VITALS — Ht 62.0 in | Wt 159.0 lb

## 2023-04-29 DIAGNOSIS — M21962 Unspecified acquired deformity of left lower leg: Secondary | ICD-10-CM

## 2023-04-29 DIAGNOSIS — M21961 Unspecified acquired deformity of right lower leg: Secondary | ICD-10-CM | POA: Diagnosis not present

## 2023-04-29 DIAGNOSIS — M722 Plantar fascial fibromatosis: Secondary | ICD-10-CM

## 2023-04-29 NOTE — Progress Notes (Signed)
Subjective:  Patient ID: Jane Patrick, female    DOB: 1960-12-05,  MRN: [id number]  Chief Complaint  Patient presents with   Foot Pain    Pt is here to f/u on bilateral foot pain, pt states feet are getting better continues to wear PF brace on both feet.    62 y.o. female presents with the above complaint.  Patient presents for follow-up of bilateral plantar fasciitis she states she is doing a lot better 100% resolved with 1 injection.  No further pain noted pain scale 0 out of 10.  She would like to discuss orthotics and prevention technique  Review of Systems: Negative except as noted in the HPI. Denies N/V/F/Ch.  Past Medical History:  Diagnosis Date   Allergy    Asthma    COPD (chronic obstructive pulmonary disease) (HCC)    Generalized headaches    GERD (gastroesophageal reflux disease)     Current Outpatient Medications:    amphetamine-dextroamphetamine (ADDERALL) 10 MG tablet, Take 1-2 tablets (10-20 mg total) by mouth 2 (two) times daily. 2 in am and 1 in pm, Disp: 90 tablet, Rfl: 0   amphetamine-dextroamphetamine (ADDERALL) 10 MG tablet, Take 1-2 tablets (10-20 mg total) by mouth 2 (two) times daily. 2 in am and one pm, Disp: 90 tablet, Rfl: 0   amphetamine-dextroamphetamine (ADDERALL) 10 MG tablet, Take 1-2 tablets (10-20 mg total) by mouth 2 (two) times daily. 2 in am and one in pm, Disp: 60 tablet, Rfl: 0   ANORO ELLIPTA 62.5-25 MCG/ACT AEPB, Inhale 1 puff into the lungs daily., Disp: 180 each, Rfl: 1   cholecalciferol (VITAMIN D) 1000 UNITS tablet, Take 2,000 Units by mouth daily., Disp: , Rfl:    DULoxetine (CYMBALTA) 60 MG capsule, TAKE 1 CAPSULE(60 MG) BY MOUTH DAILY, Disp: 90 capsule, Rfl: 1   Ipratropium-Albuterol (COMBIVENT RESPIMAT) 20-100 MCG/ACT AERS respimat, Inhale 1 puff into the lungs every 6 (six) hours as needed for wheezing., Disp: 12 g, Rfl: 1   levothyroxine (SYNTHROID) 25 MCG tablet, TAKE 2 TABLETS BY MOUTH 4 DAYS A WEEK, AND 1 TABLET FOR 3 DAYS A WEEK.  TAKE DAILY BEFORE BREAKFAST., Disp: 150 tablet, Rfl: 0   omeprazole (PRILOSEC) 40 MG capsule, Take 1 capsule (40 mg total) by mouth daily., Disp: 90 capsule, Rfl: 1   QUEtiapine (SEROQUEL) 25 MG tablet, Take 1 tablet (25 mg total) by mouth at bedtime., Disp: 90 tablet, Rfl: 1   Rimegepant Sulfate (NURTEC) 75 MG TBDP, Take 1 tablet (75 mg total) by mouth every other day., Disp: 16 tablet, Rfl: 2   topiramate (TOPAMAX) 100 MG tablet, Take 1 tablet (100 mg total) by mouth 2 (two) times daily., Disp: 180 tablet, Rfl: 1   traMADol (ULTRAM) 50 MG tablet, Take 1 tablet by mouth 2 (two) times daily as needed., Disp: , Rfl:    Ubrogepant (UBRELVY) 100 MG TABS, Take 1 tablet (100 mg total) by mouth daily as needed., Disp: 16 tablet, Rfl: 2   valACYclovir (VALTREX) 500 MG tablet, TAKE 2 TABLETS BY MOUTH TWICE DAILY FOR 10 DAYS, Disp: 20 tablet, Rfl: 0  Social History   Tobacco Use  Smoking Status Former   Current packs/day: 0.00   Average packs/day: 1 pack/day for 31.0 years (31.0 ttl pk-yrs)   Types: Cigarettes   Start date: 05/30/1975   Quit date: 05/25/2004   Years since quitting: 18.9   Passive exposure: Past  Smokeless Tobacco Former    Allergies  Allergen Reactions   Codeine  Pantoprazole     Flu like syptoms   Objective:  There were no vitals filed for this visit. Body mass index is 29.08 kg/m. Constitutional Well developed. Well nourished.  Vascular Dorsalis pedis pulses palpable bilaterally. Posterior tibial pulses palpable bilaterally. Capillary refill normal to all digits.  No cyanosis or clubbing noted. Pedal hair growth normal.  Neurologic Normal speech. Oriented to person, place, and time. Epicritic sensation to light touch grossly present bilaterally.  Dermatologic Nails well groomed and normal in appearance. No open wounds. No skin lesions.  Orthopedic: Normal joint ROM without pain or crepitus bilaterally. No visible deformities. No further tender to palpation at  the calcaneal tuber bilaterally. No pain with calcaneal squeeze bilaterally. Ankle ROM diminished range of motion bilaterally. Silfverskiold Test: positive bilaterally.   Radiographs: Taken and reviewed. No acute fractures or dislocations. No evidence of stress fracture.  Plantar heel spur absent. Posterior heel spur absent.   Assessment:   1. Plantar fasciitis of right foot   2. Plantar fasciitis of left foot   3. Deformity of both feet     Plan:  Patient was evaluated and treated and all questions answered.  Plantar Fasciitis, bilaterally -Clinically healed and officially discharged from my care I discussed shoe gear modification and prevention techniques.  If any foot and ankle issues arise future she will come back and see me. -1 injection was done  Pes planovalgus -I explained to patient the etiology of pes planovalgus and relationship with Planter fasciitis and various treatment options were discussed.  Given patient foot structure in the setting of Planter fasciitis I believe patient will benefit from custom-made orthotics to help control the hindfoot motion support the arch of the foot and take the stress away from plantar fascial.  Patient agrees with the plan like to proceed with orthotics -Patient was casted for orthotics .  No follow-ups on file.

## 2023-05-11 ENCOUNTER — Ambulatory Visit: Payer: 59 | Admitting: Podiatry

## 2023-05-11 ENCOUNTER — Encounter: Payer: Self-pay | Admitting: Podiatry

## 2023-05-11 VITALS — Ht 62.0 in | Wt 159.0 lb

## 2023-05-11 DIAGNOSIS — M21961 Unspecified acquired deformity of right lower leg: Secondary | ICD-10-CM | POA: Diagnosis not present

## 2023-05-11 DIAGNOSIS — M21962 Unspecified acquired deformity of left lower leg: Secondary | ICD-10-CM

## 2023-05-11 DIAGNOSIS — M722 Plantar fascial fibromatosis: Secondary | ICD-10-CM | POA: Diagnosis not present

## 2023-05-11 NOTE — Progress Notes (Unsigned)
Subjective:  Patient ID: Jane Patrick, female    DOB: [birth year],  MRN: [id number]  Chief Complaint  Patient presents with   Foot Pain    Pt is here for bilateral foot pain states the right foot is worse then the left.    62 y.o. female presents with the above complaint.  Patient presents with complaint of bilateral plantar fasciitis.  She states that the pain started coming back again.  She wanted to discuss treatment options for this.  She states she did 1 injection but she was doing much better but now started coming back again.  Denies any other acute complaints   Review of Systems: Negative except as noted in the HPI. Denies N/V/F/Ch.  Past Medical History:  Diagnosis Date   Allergy    Asthma    COPD (chronic obstructive pulmonary disease) (HCC)    Generalized headaches    GERD (gastroesophageal reflux disease)     Current Outpatient Medications:    amphetamine-dextroamphetamine (ADDERALL) 10 MG tablet, Take 1-2 tablets (10-20 mg total) by mouth 2 (two) times daily. 2 in am and 1 in pm, Disp: 90 tablet, Rfl: 0   amphetamine-dextroamphetamine (ADDERALL) 10 MG tablet, Take 1-2 tablets (10-20 mg total) by mouth 2 (two) times daily. 2 in am and one pm, Disp: 90 tablet, Rfl: 0   amphetamine-dextroamphetamine (ADDERALL) 10 MG tablet, Take 1-2 tablets (10-20 mg total) by mouth 2 (two) times daily. 2 in am and one in pm, Disp: 60 tablet, Rfl: 0   ANORO ELLIPTA 62.5-25 MCG/ACT AEPB, Inhale 1 puff into the lungs daily., Disp: 180 each, Rfl: 1   cholecalciferol (VITAMIN D) 1000 UNITS tablet, Take 2,000 Units by mouth daily., Disp: , Rfl:    DULoxetine (CYMBALTA) 60 MG capsule, TAKE 1 CAPSULE(60 MG) BY MOUTH DAILY, Disp: 90 capsule, Rfl: 1   Ipratropium-Albuterol (COMBIVENT RESPIMAT) 20-100 MCG/ACT AERS respimat, Inhale 1 puff into the lungs every 6 (six) hours as needed for wheezing., Disp: 12 g, Rfl: 1   levothyroxine (SYNTHROID) 25 MCG tablet, TAKE 2 TABLETS BY MOUTH 4 DAYS A WEEK, AND 1  TABLET FOR 3 DAYS A WEEK. TAKE DAILY BEFORE BREAKFAST., Disp: 150 tablet, Rfl: 0   omeprazole (PRILOSEC) 40 MG capsule, Take 1 capsule (40 mg total) by mouth daily., Disp: 90 capsule, Rfl: 1   QUEtiapine (SEROQUEL) 25 MG tablet, Take 1 tablet (25 mg total) by mouth at bedtime., Disp: 90 tablet, Rfl: 1   Rimegepant Sulfate (NURTEC) 75 MG TBDP, Take 1 tablet (75 mg total) by mouth every other day., Disp: 16 tablet, Rfl: 2   topiramate (TOPAMAX) 100 MG tablet, Take 1 tablet (100 mg total) by mouth 2 (two) times daily., Disp: 180 tablet, Rfl: 1   traMADol (ULTRAM) 50 MG tablet, Take 1 tablet by mouth 2 (two) times daily as needed., Disp: , Rfl:    Ubrogepant (UBRELVY) 100 MG TABS, Take 1 tablet (100 mg total) by mouth daily as needed., Disp: 16 tablet, Rfl: 2   valACYclovir (VALTREX) 500 MG tablet, TAKE 2 TABLETS BY MOUTH TWICE DAILY FOR 10 DAYS, Disp: 20 tablet, Rfl: 0  Social History   Tobacco Use  Smoking Status Former   Current packs/day: 0.00   Average packs/day: 1 pack/day for 31.0 years (31.0 ttl pk-yrs)   Types: Cigarettes   Start date: 05/30/1975   Quit date: 05/25/2004   Years since quitting: 18.9   Passive exposure: Past  Smokeless Tobacco Former    Allergies  Allergen Reactions  Codeine    Pantoprazole     Flu like syptoms   Objective:  There were no vitals filed for this visit. Body mass index is 29.08 kg/m. Constitutional Well developed. Well nourished.  Vascular Dorsalis pedis pulses palpable bilaterally. Posterior tibial pulses palpable bilaterally. Capillary refill normal to all digits.  No cyanosis or clubbing noted. Pedal hair growth normal.  Neurologic Normal speech. Oriented to person, place, and time. Epicritic sensation to light touch grossly present bilaterally.  Dermatologic Nails well groomed and normal in appearance. No open wounds. No skin lesions.  Orthopedic: Normal joint ROM without pain or crepitus bilaterally. No visible deformities. Tender to  palpation at the calcaneal tuber bilaterally. No pain with calcaneal squeeze bilaterally. Ankle ROM diminished range of motion bilaterally. Silfverskiold Test: positive bilaterally.   Radiographs: Taken and reviewed. No acute fractures or dislocations. No evidence of stress fracture.  Plantar heel spur absent. Posterior heel spur absent.   Assessment:   1. Plantar fasciitis of right foot   2. Plantar fasciitis of left foot   3. Deformity of both feet     Plan:  Patient was evaluated and treated and all questions answered.  Plantar Fasciitis, bilaterally~recurrence - XR reviewed as above.  - Educated on icing and stretching. Instructions given.  -Second injection delivered to the plantar fascia as below. - DME: Plantar fascial brace dispensed to support the medial longitudinal arch of the foot and offload pressure from the heel and prevent arch collapse during weightbearing - Pharmacologic management: None  Procedure: Injection Tendon/Ligament Location: Bilateral plantar fascia at the glabrous junction; medial approach. Skin Prep: alcohol Injectate: 0.5 cc 0.5% marcaine plain, 0.5 cc of 1% Lidocaine, 0.5 cc kenalog 10. Disposition: Patient tolerated procedure well. Injection site dressed with a band-aid.  No follow-ups on file.

## 2023-05-14 ENCOUNTER — Other Ambulatory Visit: Payer: Self-pay | Admitting: Family Medicine

## 2023-05-14 DIAGNOSIS — I700 Atherosclerosis of aorta: Secondary | ICD-10-CM

## 2023-05-14 MED ORDER — PRAVASTATIN SODIUM 40 MG PO TABS: 40.0000 mg | ORAL_TABLET | Freq: Every day | ORAL | 1 refills | Status: DC

## 2023-05-28 ENCOUNTER — Other Ambulatory Visit: Payer: Self-pay | Admitting: Nurse Practitioner

## 2023-05-28 DIAGNOSIS — F339 Major depressive disorder, recurrent, unspecified: Secondary | ICD-10-CM

## 2023-05-31 NOTE — Telephone Encounter (Signed)
 Requested Prescriptions  Refused Prescriptions Disp Refills   DULoxetine  (CYMBALTA ) 60 MG capsule [Pharmacy Med Name: DULOXETINE  DR 60MG  CAPSULES] 90 capsule 1    Sig: TAKE 1 CAPSULE(60 MG) BY MOUTH DAILY     Psychiatry: Antidepressants - SNRI - duloxetine  Failed - 05/31/2023  3:59 PM      Failed - Cr in normal range and within 360 days    Creat  Date Value Ref Range Status  04/13/2023 1.10 (H) 0.50 - 1.05 mg/dL Final         Passed - eGFR is 30 or above and within 360 days    GFR, Est African American  Date Value Ref Range Status  01/11/2020 68 > OR = 60 mL/min/1.36m2 Final   GFR, Est Non African American  Date Value Ref Range Status  01/11/2020 59 (L) > OR = 60 mL/min/1.16m2 Final   GFR, Estimated  Date Value Ref Range Status  08/04/2022 >60 >60 mL/min Final    Comment:    (NOTE) Calculated using the CKD-EPI Creatinine Equation (2021)    eGFR  Date Value Ref Range Status  04/13/2023 57 (L) > OR = 60 mL/min/1.50m2 Final         Passed - Completed PHQ-2 or PHQ-9 in the last 360 days      Passed - Last BP in normal range    BP Readings from Last 1 Encounters:  04/13/23 122/80         Passed - Valid encounter within last 6 months    Recent Outpatient Visits           1 month ago COPD, mild [facility])   [facility] Glenard Mire, MD   2 months ago Heel pain, bilateral   [facility] [place], Mire, MD   4 months ago Attention deficit hyperactivity disorder (ADHD), predominantly inattentive type   [facility] Glenard Mire, MD   11 months ago COPD, mild [facility])   [place] Sowles, Krichna, MD   1 year ago COPD, mild [facility])   [facility] Sowles, Krichna, MD       Future Appointments             In 1 month [place], Krichna, MD [facility], [facility]

## 2023-06-03 ENCOUNTER — Encounter: Payer: Self-pay | Admitting: Family Medicine

## 2023-06-18 ENCOUNTER — Telehealth: Payer: Self-pay

## 2023-06-18 NOTE — Telephone Encounter (Signed)
[facility] are in [place].

## 2023-07-02 ENCOUNTER — Other Ambulatory Visit: Payer: Self-pay | Admitting: Family Medicine

## 2023-07-02 DIAGNOSIS — Z9889 Other specified postprocedural states: Secondary | ICD-10-CM

## 2023-07-02 DIAGNOSIS — K219 Gastro-esophageal reflux disease without esophagitis: Secondary | ICD-10-CM

## 2023-07-05 ENCOUNTER — Other Ambulatory Visit: Payer: Self-pay | Admitting: Family Medicine

## 2023-07-05 DIAGNOSIS — E039 Hypothyroidism, unspecified: Secondary | ICD-10-CM

## 2023-07-05 NOTE — Telephone Encounter (Signed)
 Requested Prescriptions  Pending Prescriptions Disp Refills   omeprazole  (PRILOSEC) 40 MG capsule [Pharmacy Med Name: OMEPRAZOLE  40MG  CAPSULES] 90 capsule 1    Sig: TAKE 1 CAPSULE(40 MG) BY MOUTH DAILY     Gastroenterology: Proton Pump Inhibitors Passed - 07/05/2023  8:31 AM      Passed - Valid encounter within last 12 months    Recent Outpatient Visits           2 months ago COPD, mild [facility])   [facility] Arleen Lacer, MD   3 months ago Heel pain, bilateral   [facility] Arleen Lacer, MD   6 months ago Attention deficit hyperactivity disorder (ADHD), predominantly inattentive type   [facility] Sowles, Krichna, MD   1 year ago COPD, mild [facility])   [facility] Sowles, Krichna, MD   1 year ago COPD, mild [facility])   [facility] Sowles, Krichna, MD       Future Appointments             In 1 week Sowles, Krichna, MD [facility] [place] [facility]

## 2023-07-06 ENCOUNTER — Other Ambulatory Visit: Payer: Self-pay | Admitting: Family Medicine

## 2023-07-06 ENCOUNTER — Encounter: Payer: Self-pay | Admitting: Family Medicine

## 2023-07-06 DIAGNOSIS — E039 Hypothyroidism, unspecified: Secondary | ICD-10-CM

## 2023-07-06 MED ORDER — LEVOTHYROXINE SODIUM 25 MCG PO TABS: ORAL_TABLET | ORAL | 0 refills | Status: DC

## 2023-07-06 NOTE — Telephone Encounter (Signed)
Requested medication (s) are due for refill today: routing for review  Requested medication (s) are on the active medication list: yes  Last refill:  04/13/23  Future visit scheduled: yes  Notes to clinic:  routing for review, of SIG instructions.     Requested Prescriptions  Pending Prescriptions Disp Refills   levothyroxine (SYNTHROID) 25 MCG tablet [Pharmacy Med Name: LEVOTHYROXINE 0.025MG  ( ) TAB] 150 tablet 0    Sig: TAKE 2 TABLETS BY MOUTH 4 DAYS A WEEK AND 1 TABLET FOR 3 DAYS A WEEK, TAKE EVERY DAY BEFORE BREAKFAST     Endocrinology:  Hypothyroid Agents Passed - 07/06/2023 10:21 AM      Passed - TSH in normal range and within 360 days    TSH  Date Value Ref Range Status  04/13/2023 3.43 0.40 - 4.50 mIU/L Final         Passed - Valid encounter within last 12 months    Recent Outpatient Visits           2 months ago COPD, mild [facility])   [facility] Alba Cory, MD   3 months ago Heel pain, bilateral    [facility] Alba Cory, MD   6 months ago Attention deficit hyperactivity disorder (ADHD), predominantly inattentive type   [facility] Alba Cory, MD   1 year ago COPD, mild [facility])   [facility] Al[facility] Cory, MD   1 year ago COPD, mild [facility])   [facility] Alba Cory, MD       Future Appointments             In 1 week Alba Cory, MD [facility], [facility]

## 2023-07-14 ENCOUNTER — Ambulatory Visit: Payer: Self-pay | Admitting: Family Medicine

## 2023-07-14 ENCOUNTER — Other Ambulatory Visit: Payer: Self-pay | Admitting: Family Medicine

## 2023-07-14 DIAGNOSIS — F5104 Psychophysiologic insomnia: Secondary | ICD-10-CM

## 2023-07-15 ENCOUNTER — Other Ambulatory Visit: Payer: 59

## 2023-07-23 ENCOUNTER — Encounter: Payer: Self-pay | Admitting: Family Medicine

## 2023-07-23 ENCOUNTER — Ambulatory Visit: Payer: 59 | Admitting: Family Medicine

## 2023-07-23 VITALS — BP 126/80 | HR 96 | Resp 16 | Ht 62.0 in | Wt 163.3 lb

## 2023-07-23 DIAGNOSIS — N1831 Chronic kidney disease, stage 3a: Secondary | ICD-10-CM

## 2023-07-23 DIAGNOSIS — G43009 Migraine without aura, not intractable, without status migrainosus: Secondary | ICD-10-CM

## 2023-07-23 DIAGNOSIS — E538 Deficiency of other specified B group vitamins: Secondary | ICD-10-CM

## 2023-07-23 DIAGNOSIS — K219 Gastro-esophageal reflux disease without esophagitis: Secondary | ICD-10-CM

## 2023-07-23 DIAGNOSIS — I700 Atherosclerosis of aorta: Secondary | ICD-10-CM

## 2023-07-23 DIAGNOSIS — J449 Chronic obstructive pulmonary disease, unspecified: Secondary | ICD-10-CM

## 2023-07-23 DIAGNOSIS — F339 Major depressive disorder, recurrent, unspecified: Secondary | ICD-10-CM | POA: Diagnosis not present

## 2023-07-23 DIAGNOSIS — F5104 Psychophysiologic insomnia: Secondary | ICD-10-CM

## 2023-07-23 DIAGNOSIS — F900 Attention-deficit hyperactivity disorder, predominantly inattentive type: Secondary | ICD-10-CM

## 2023-07-23 DIAGNOSIS — E039 Hypothyroidism, unspecified: Secondary | ICD-10-CM

## 2023-07-23 DIAGNOSIS — E785 Hyperlipidemia, unspecified: Secondary | ICD-10-CM

## 2023-07-23 MED ORDER — TOPIRAMATE 100 MG PO TABS: 100.0000 mg | ORAL_TABLET | Freq: Two times a day (BID) | ORAL | 1 refills | Status: DC

## 2023-07-23 MED ORDER — AMPHETAMINE-DEXTROAMPHETAMINE 10 MG PO TABS: 10.0000 mg | ORAL_TABLET | Freq: Two times a day (BID) | ORAL | 0 refills | Status: DC

## 2023-07-23 MED ORDER — QUETIAPINE FUMARATE 25 MG PO TABS
25.0000 mg | ORAL_TABLET | Freq: Every day | ORAL | 0 refills | Status: DC
Start: 2023-07-23 — End: 2023-10-21

## 2023-07-23 MED ORDER — NEXLIZET 180-10 MG PO TABS: 1.0000 | ORAL_TABLET | Freq: Every day | ORAL | 1 refills | Status: DC

## 2023-07-23 NOTE — Progress Notes (Signed)
 Name: Jane Patrick   MRN: [id number]    DOB: [birth year]   Date:07/23/2023       Progress Note  Subjective  Chief Complaint  Chief Complaint  Patient presents with   Medical Management of Chronic Issues   HPI   ADD : she has been taking Adderal now, taking it prn, She states sometimes 1 or 2 pills mostly once a day, occasionally takes two pills.  She notices inability to focus without medication and needs to take to perform at work  Stable, checked controlled drug database   Chronic back pain:  she is seeing [name] and had MRI done 03/30/2019, and showed L2- 3  Disc bulge , and also a hypointense bone lesion, repeat Feb 2021 showed stability . She continues to have daily pain described as aching like and average 4/10 , she is taking Tramadol prn . She states pain goes up to 7-8/10 when standing to cook.  Migraine headache: she is on Topamax for prevention and has been compliant lately, she noticed an improvement of symptoms, over the past few months had two severe episode Bernita Raisin works well for her, however to take it daily due to weather changes. She never started nurtec every other day   Herpes Type II: she had a small lesion when she was seen by [name] and culture was positive for herpes, she was given Valtrex, no other outbreaks.   COPD Mild: No longer smoking - quit about 14 years ago. She uses Anoro daily , she uses combivent prn very seldom now. No cough or wheezing, very seldom has SOB   B12 deficiency: found in June 2016, she is taking supplementation She also has vitamin D deficiency and is taking supplements .    Major Depression Currently  Duloxetine, no side effects. She states Seroquel is working well for sleep . She denies crying spells or change in appetite Phq 9 is positive . She bought a house with her daughter.    Hypothyroidism: she is taking levothyroxine, no hair loss, no change in bowel movements Last TSH at goal. Continue medication   Pre diabetes: Last  A1C 6 % , we will recheck level, denies polyphagia, polydipsia and or polyuria . Discussed low carbohydrate diet   Atherosclerosis of aorta/Dyslipidemia : explained that is a risk of heart disease, she tried pravastatin but stopped because it was affecting her cognition, we will try Nexlizet   Dysphagia/GERD: seen by Dr. Allegra Lai and had dilation done 08/2018 and is doing well since No dysphagia as long ash she takes PPI daily and symptoms have been controlling.   Patient Active Problem List   Diagnosis Date Noted   Dyslipidemia 06/29/2022   Hyperglycemia 06/29/2022   Precordial pain 04/07/2022   Hyperlipidemia due to dietary fat intake 04/07/2022   Esophageal dysphagia    Hypothyroidism in adult 08/15/2017   Atherosclerosis of aorta (HCC) 09/22/2016   Hiatal hernia 09/22/2016   Abnormal liver CT 09/11/2016   H/O herpes simplex type 2 infection 08/20/2015   Hematuria 02/19/2015   B12 deficiency 02/19/2015   Attention deficit hyperactivity disorder (ADHD), predominantly inattentive type 11/13/2014   COPD, mild (HCC) 11/13/2014   Major depression in remission (HCC) 11/13/2014   History of basal cell carcinoma 11/13/2014   GERD without esophagitis 11/13/2014   History of abnormal cervical Pap smear 11/13/2014   Migraine without aura and without status migrainosus, not intractable 11/13/2014   Vitamin D deficiency 11/13/2014   Liver lesion 10/04/2010  Past Surgical History:  Procedure Laterality Date   ABDOMINAL HYSTERECTOMY  2004   partial   BREAST SURGERY     biopsies   CT CTA CORONARY W/CA SCORE W/CM &/OR WO/CM  04/13/2022   No extracardiac abnormalities.  Coronary Calcium Score 0.  Nonobstructive mild disease in the LAD (25 to 49%).  Otherwise no obstructive coronary disease with a right dominant system.   ESOPHAGOGASTRODUODENOSCOPY (EGD) WITH PROPOFOL N/A 08/02/2018   Procedure: ESOPHAGOGASTRODUODENOSCOPY (EGD) WITH PROPOFOL;  Surgeon: [name], MD;  [facility]  ENDOSCOPY;  Service: Gastroenterology;  Laterality: N/A;   FRACTURE SURGERY     rode in femur   POLYPECTOMY     vocal chord polyps   ROBOTIC ASSISTED LAPAROSCOPIC CHOLECYSTECTOMY  07/22/2022   TRANSTHORACIC ECHOCARDIOGRAM  04/13/2022   Normal LV size and function.  EF 60 to 65%.  No RWMA.  GR 1 DD.  Normal RV.  Normal aortic and mitral valves.  Normal RAP.--NORMAL STUDY    Family History  Problem Relation Age of Onset   Colon cancer Father    Breast cancer Sister    Diverticulitis Brother    Diverticulitis Brother    Heart attack Maternal Grandmother 73    Social History   Tobacco Use   Smoking status: Former    Current packs/day: 0.00    Average packs/day: 1 pack/day for 31.0 years (31.0 ttl pk-yrs)    Types: Cigarettes    Start date: 05/30/1975    Quit date: 05/25/2004    Years since quitting: 19.1    Passive exposure: Past   Smokeless tobacco: Former  Substance Use Topics   Alcohol use: No    Alcohol/week: 0.0 standard drinks of alcohol     Current Outpatient Medications:    ANORO ELLIPTA 62.5-25 MCG/ACT AEPB, Inhale 1 puff into the lungs daily., Disp: 180 each, Rfl: 1   Bempedoic Acid-Ezetimibe (NEXLIZET) 180-10 MG TABS, Take 1 tablet by mouth daily at 12 noon., Disp: 90 tablet, Rfl: 1   cholecalciferol (VITAMIN D) 1000 UNITS tablet, Take 2,000 Units by mouth daily., Disp: , Rfl:    DULoxetine (CYMBALTA) 60 MG capsule, TAKE 1 CAPSULE(60 MG) BY MOUTH DAILY, Disp: 90 capsule, Rfl: 1   Ipratropium-Albuterol (COMBIVENT RESPIMAT) 20-100 MCG/ACT AERS respimat, Inhale 1 puff into the lungs every 6 (six) hours as needed for wheezing., Disp: 12 g, Rfl: 1   levothyroxine (SYNTHROID) 25 MCG tablet, TAKE 2 TABLETS BY MOUTH 4 DAYS A WEEK, AND 1 TABLET FOR 3 DAYS A WEEK. TAKE DAILY BEFORE BREAKFAST., Disp: 150 tablet, Rfl: 0   omeprazole (PRILOSEC) 40 MG capsule, TAKE 1 CAPSULE(40 MG) BY MOUTH DAILY, Disp: 90 capsule, Rfl: 1   Rimegepant Sulfate (NURTEC) 75 MG TBDP, Take 1 tablet (75  mg total) by mouth every other day., Disp: 16 tablet, Rfl: 2   topiramate (TOPAMAX) 100 MG tablet, Take 1 tablet (100 mg total) by mouth 2 (two) times daily., Disp: 180 tablet, Rfl: 1   traMADol (ULTRAM) 50 MG tablet, Take 1 tablet by mouth 2 (two) times daily as needed., Disp: , Rfl:    Ubrogepant (UBRELVY) 100 MG TABS, Take 1 tablet (100 mg total) by mouth daily as needed., Disp: 16 tablet, Rfl: 2   valACYclovir (VALTREX) 500 MG tablet, TAKE 2 TABLETS BY MOUTH TWICE DAILY FOR 10 DAYS, Disp: 20 tablet, Rfl: 0   amphetamine-dextroamphetamine (ADDERALL) 10 MG tablet, Take 1-2 tablets (10-20 mg total) by mouth 2 (two) times daily. 2 in am and one pm,  Disp: 90 tablet, Rfl: 0   amphetamine-dextroamphetamine (ADDERALL) 10 MG tablet, Take 1-2 tablets (10-20 mg total) by mouth 2 (two) times daily. 2 in am and one in pm, Disp: 60 tablet, Rfl: 0   amphetamine-dextroamphetamine (ADDERALL) 10 MG tablet, Take 1-2 tablets (10-20 mg total) by mouth 2 (two) times daily. 2 in am and 1 in pm, Disp: 90 tablet, Rfl: 0   QUEtiapine (SEROQUEL) 25 MG tablet, Take 1 tablet (25 mg total) by mouth at bedtime., Disp: 90 tablet, Rfl: 0  Allergies  Allergen Reactions   Codeine    Pantoprazole     Flu like syptoms    I personally reviewed active problem list, medication list, allergies, family history with the patient/caregiver today.   ROS  Ten systems reviewed and is negative except as mentioned in HPI    Objective  Vitals:   07/23/23 0810  BP: 126/80  Pulse: 96  Resp: 16  SpO2: 98%  Weight: 163 lb 4.8 oz (74.1 kg)  Height: 5\' 2"  (1.575 m)    Body mass index is 29.87 kg/m.  Physical Exam  Constitutional: Patient appears well-developed and well-nourished. No distress.  HEENT: head atraumatic, normocephalic, pupils equal and reactive to light, neck supple Cardiovascular: Normal rate, regular rhythm and normal heart sounds.  No murmur heard. No BLE edema. Pulmonary/Chest: Effort normal and breath  sounds normal. No respiratory distress. Abdominal: Soft.  There is no tenderness. Psychiatric: Patient has a normal mood and affect. behavior is normal. Judgment and thought content normal.   Diabetic Foot Exam:     PHQ2/9:    07/23/2023    8:09 AM 04/13/2023    8:58 AM 03/23/2023    7:49 AM 01/06/2023    7:43 AM 06/29/2022    7:52 AM  Depression screen PHQ 2/9  Decreased Interest 1 0 1 1 2   Down, Depressed, Hopeless 1 1 0 0 1  PHQ - 2 Score 2 1 1 1 3   Altered sleeping 2 3 1 3 3   Tired, decreased energy 2 3 1 3 3   Change in appetite 0 2 0 0 0  Feeling bad or failure about yourself  0 0 0 0 0  Trouble concentrating 0 2 0 3 0  Moving slowly or fidgety/restless 0 0 0 0 0  Suicidal thoughts 0 0 0 0 0  PHQ-9 Score 6 11 3 10 9   Difficult doing work/chores Not difficult at all Not difficult at all       phq 9 is negative  Fall Risk:    04/13/2023    8:57 AM 03/23/2023    7:48 AM 01/06/2023    7:43 AM 06/29/2022    7:52 AM 03/26/2022    9:11 AM  Fall Risk   Falls in the past year? 1 0 1 0 0  Number falls in past yr: 0 0 0 0 0  Injury with Fall? 1 0 1 0 0  Risk for fall due to : History of fall(s) No Fall Risks No Fall Risks No Fall Risks No Fall Risks  Follow up Education provided;Falls evaluation completed;Falls prevention discussed Falls prevention discussed Falls prevention discussed Falls prevention discussed Education provided;Falls evaluation completed;Falls prevention discussed     Assessment & Plan   1. Atherosclerosis of aorta (HCC) (Primary)  - Bempedoic Acid-Ezetimibe (NEXLIZET) 180-10 MG TABS; Take 1 tablet by mouth daily at 12 noon.  Dispense: 90 tablet; Refill: 1  2. COPD, mild (HCC)  Using Anoro and doing well   3.  Major depression, recurrent, chronic (HCC)  Continue medication, looking forward to going on vacation with a friend - [place]  4. Stage 3a chronic kidney disease (HCC)  Recheck level next visit  5. Hypothyroidism in adult  Continue  levothyroxine   6. Migraine without aura and without status migrainosus, not intractable  stable  7. B12 deficiency  Continue supplementation  8. Attention deficit hyperactivity disorder (ADHD), predominantly inattentive type  - amphetamine-dextroamphetamine (ADDERALL) 10 MG tablet; Take 1-2 tablets (10-20 mg total) by mouth 2 (two) times daily. 2 in am and one pm  Dispense: 90 tablet; Refill: 0 - amphetamine-dextroamphetamine (ADDERALL) 10 MG tablet; Take 1-2 tablets (10-20 mg total) by mouth 2 (two) times daily. 2 in am and one in pm  Dispense: 60 tablet; Refill: 0 - amphetamine-dextroamphetamine (ADDERALL) 10 MG tablet; Take 1-2 tablets (10-20 mg total) by mouth 2 (two) times daily. 2 in am and 1 in pm  Dispense: 90 tablet; Refill: 0  9. Dyslipidemia  - Bempedoic Acid-Ezetimibe (NEXLIZET) 180-10 MG TABS; Take 1 tablet by mouth daily at 12 noon.  Dispense: 90 tablet; Refill: 1  10. GERD without esophagitis  Stable  11. Psychophysiological insomnia  - QUEtiapine (SEROQUEL) 25 MG tablet; Take 1 tablet (25 mg total) by mouth at bedtime.  Dispense: 90 tablet; Refill: 0

## 2023-07-30 ENCOUNTER — Other Ambulatory Visit: Payer: 59

## 2023-09-13 LAB — HM MAMMOGRAPHY

## 2023-10-08 ENCOUNTER — Telehealth: Payer: Self-pay | Admitting: Pharmacy Technician

## 2023-10-08 ENCOUNTER — Other Ambulatory Visit (HOSPITAL_COMMUNITY): Payer: Self-pay

## 2023-10-08 NOTE — Telephone Encounter (Signed)
 Pharmacy Patient Advocate Encounter   Received notification from Onbase that prior authorization for OMEPRAZOLE  40 MG CAPSULES is required/requested.   Insurance verification completed.   The patient is insured through [facility] .   Per test claim: PA required and submitted KEY/EOC/Request #: [id number] from 10/08/23 to 10/07/24  PA Case ID #: [id number]

## 2023-10-21 ENCOUNTER — Ambulatory Visit: Payer: 59 | Admitting: Family Medicine

## 2023-10-21 ENCOUNTER — Encounter: Payer: Self-pay | Admitting: Family Medicine

## 2023-10-21 VITALS — BP 118/72 | HR 95 | Resp 16 | Ht 62.0 in | Wt 160.5 lb

## 2023-10-21 DIAGNOSIS — F900 Attention-deficit hyperactivity disorder, predominantly inattentive type: Secondary | ICD-10-CM

## 2023-10-21 DIAGNOSIS — N1831 Chronic kidney disease, stage 3a: Secondary | ICD-10-CM | POA: Diagnosis not present

## 2023-10-21 DIAGNOSIS — K219 Gastro-esophageal reflux disease without esophagitis: Secondary | ICD-10-CM | POA: Diagnosis not present

## 2023-10-21 DIAGNOSIS — A09 Infectious gastroenteritis and colitis, unspecified: Secondary | ICD-10-CM

## 2023-10-21 DIAGNOSIS — F5104 Psychophysiologic insomnia: Secondary | ICD-10-CM

## 2023-10-21 DIAGNOSIS — F339 Major depressive disorder, recurrent, unspecified: Secondary | ICD-10-CM

## 2023-10-21 DIAGNOSIS — I700 Atherosclerosis of aorta: Secondary | ICD-10-CM

## 2023-10-21 DIAGNOSIS — E538 Deficiency of other specified B group vitamins: Secondary | ICD-10-CM

## 2023-10-21 DIAGNOSIS — E559 Vitamin D deficiency, unspecified: Secondary | ICD-10-CM

## 2023-10-21 DIAGNOSIS — G43009 Migraine without aura, not intractable, without status migrainosus: Secondary | ICD-10-CM

## 2023-10-21 DIAGNOSIS — J449 Chronic obstructive pulmonary disease, unspecified: Secondary | ICD-10-CM

## 2023-10-21 DIAGNOSIS — E785 Hyperlipidemia, unspecified: Secondary | ICD-10-CM

## 2023-10-21 DIAGNOSIS — E039 Hypothyroidism, unspecified: Secondary | ICD-10-CM

## 2023-10-21 MED ORDER — QUETIAPINE FUMARATE 25 MG PO TABS: 25.0000 mg | ORAL_TABLET | Freq: Every day | ORAL | 1 refills | Status: AC

## 2023-10-21 MED ORDER — UBRELVY 100 MG PO TABS: 1.0000 | ORAL_TABLET | Freq: Every day | ORAL | 2 refills | Status: AC | PRN

## 2023-10-21 MED ORDER — ANORO ELLIPTA 62.5-25 MCG/ACT IN AEPB: 1.0000 | INHALATION_SPRAY | Freq: Every day | RESPIRATORY_TRACT | 1 refills | Status: DC

## 2023-10-21 MED ORDER — COMBIVENT RESPIMAT 20-100 MCG/ACT IN AERS: 1.0000 | INHALATION_SPRAY | Freq: Four times a day (QID) | RESPIRATORY_TRACT | 1 refills | Status: AC | PRN

## 2023-10-21 MED ORDER — AMPHETAMINE-DEXTROAMPHETAMINE 20 MG PO TABS
20.0000 mg | ORAL_TABLET | Freq: Every day | ORAL | 0 refills | Status: DC
Start: 2023-10-21 — End: 2024-01-13

## 2023-10-21 MED ORDER — DULOXETINE HCL 60 MG PO CPEP: ORAL_CAPSULE | ORAL | 1 refills | Status: DC

## 2023-10-21 MED ORDER — LEVOTHYROXINE SODIUM 25 MCG PO TABS: ORAL_TABLET | ORAL | 1 refills | Status: DC

## 2023-10-21 NOTE — Progress Notes (Signed)
 Name: Jane Patrick   MRN: [id number]    DOB: September 08, 1960   Date:10/21/2023       Progress Note  Subjective  Chief Complaint  Chief Complaint  Patient presents with   Medical Management of Chronic Issues   Discussed the use of AI scribe software for clinical note transcription with the patient, who gave verbal consent to proceed.  History of Present Illness Jane Patrick is a 63 year old female with COPD and prediabetes who presents with diarrhea and fatigue.  She has been experiencing diarrhea for about a week, which began after her grandson fell ill. Initially, she had watery stools eight to ten times a day. After taking Imodium, the frequency decreased to three or four times a day, but watery stools resumed this morning. No nausea, vomiting, or fever. She reports significant fatigue and weakness, and despite efforts to stay hydrated, she feels dehydrated. She has gradually been able to eat after not eating for three days.  She has a history of COPD and is currently using Anoro daily and Combivent  as needed, though she reports needing it less frequently. No daily coughing, wheezing, or shortness of breath, except when affected by allergies.  Her migraine headaches are managed with Topamax  100 mg twice daily, and she uses Ubrelvy  for acute episodes, which she describes as a 'sick throb' behind her eyes, often accompanied by nausea.  She experiences chronic back pain, particularly when standing or cooking, with a pain level of seven out of ten this morning. She takes Tramadol 50 mg twice daily as needed.  She has a history of herpes managed with Valtrex  during outbreaks, which are infrequent.  She takes B12 and vitamin D  supplements for a B12 deficiency and has a history of major depression, managed with duloxetine  and Seroquel  for sleep. She reports improved emotional well-being since moving in with her daughter.  She has hypothyroidism managed with levothyroxine , with no recent symptoms of  hair loss or dry skin. Her last A1c was 6.1, and she is trying to eat healthy and cut down on carbs to prevent diabetes.     Patient Active Problem List   Diagnosis Date Noted   Dyslipidemia 06/29/2022   Hyperglycemia 06/29/2022   Precordial pain 04/07/2022   Hyperlipidemia due to dietary fat intake 04/07/2022   Esophageal dysphagia    Hypothyroidism in adult 08/15/2017   Atherosclerosis of aorta (HCC) 09/22/2016   Hiatal hernia 09/22/2016   Abnormal liver CT 09/11/2016   H/O herpes simplex type 2 infection 08/20/2015   Hematuria 02/19/2015   B12 deficiency 02/19/2015   Attention deficit hyperactivity disorder (ADHD), predominantly inattentive type 11/13/2014   COPD, mild (HCC) 11/13/2014   Major depression in remission (HCC) 11/13/2014   History of basal cell carcinoma 11/13/2014   GERD without esophagitis 11/13/2014   History of abnormal cervical Pap smear 11/13/2014   Migraine without aura and without status migrainosus, not intractable 11/13/2014   Vitamin D  deficiency 11/13/2014   Liver lesion 10/04/2010    Past Surgical History:  Procedure Laterality Date   ABDOMINAL HYSTERECTOMY  2004   partial   BREAST SURGERY     biopsies   CT CTA CORONARY W/CA SCORE W/CM &/OR WO/CM  04/13/2022   No extracardiac abnormalities.  Coronary Calcium Score 0.  Nonobstructive mild disease in the LAD (25 to 49%).  Otherwise no obstructive coronary disease with a right dominant system.   ESOPHAGOGASTRODUODENOSCOPY (EGD) WITH PROPOFOL  N/A 08/02/2018   Procedure: ESOPHAGOGASTRODUODENOSCOPY (EGD) WITH PROPOFOL ;  Surgeon: Selena Daily, MD;  Location: [facility] ENDOSCOPY;  Service: Gastroenterology;  Laterality: N/A;   FRACTURE SURGERY     rode in femur   POLYPECTOMY     vocal chord polyps   ROBOTIC ASSISTED LAPAROSCOPIC CHOLECYSTECTOMY  07/22/2022   TRANSTHORACIC ECHOCARDIOGRAM  04/13/2022   Normal LV size and function.  EF 60 to 65%.  No RWMA.  GR 1 DD.  Normal RV.  Normal aortic and  mitral valves.  Normal RAP.--NORMAL STUDY    Family History  Problem Relation Age of Onset   Colon cancer Father    Breast cancer Sister    Diverticulitis Brother    Diverticulitis Brother    Heart attack Maternal Grandmother 14    Social History   Tobacco Use   Smoking status: Former    Current packs/day: 0.00    Average packs/day: 1 pack/day for 31.0 years (31.0 ttl pk-yrs)    Types: Cigarettes    Start date: 05/30/1975    Quit date: 05/25/2004    Years since quitting: 19.4    Passive exposure: Past   Smokeless tobacco: Former  Substance Use Topics   Alcohol use: No    Alcohol/week: 0.0 standard drinks of alcohol     Current Outpatient Medications:    amphetamine -dextroamphetamine  (ADDERALL) 10 MG tablet, Take 1-2 tablets (10-20 mg total) by mouth 2 (two) times daily. 2 in am and one pm, Disp: 90 tablet, Rfl: 0   amphetamine -dextroamphetamine  (ADDERALL) 10 MG tablet, Take 1-2 tablets (10-20 mg total) by mouth 2 (two) times daily. 2 in am and one in pm, Disp: 60 tablet, Rfl: 0   amphetamine -dextroamphetamine  (ADDERALL) 10 MG tablet, Take 1-2 tablets (10-20 mg total) by mouth 2 (two) times daily. 2 in am and 1 in pm, Disp: 90 tablet, Rfl: 0   ANORO ELLIPTA  62.5-25 MCG/ACT AEPB, Inhale 1 puff into the lungs daily., Disp: 180 each, Rfl: 1   cholecalciferol (VITAMIN D ) 1000 UNITS tablet, Take 2,000 Units by mouth daily., Disp: , Rfl:    DULoxetine  (CYMBALTA ) 60 MG capsule, TAKE 1 CAPSULE(60 MG) BY MOUTH DAILY, Disp: 90 capsule, Rfl: 1   Ipratropium-Albuterol  (COMBIVENT  RESPIMAT) 20-100 MCG/ACT AERS respimat, Inhale 1 puff into the lungs every 6 (six) hours as needed for wheezing., Disp: 12 g, Rfl: 1   levothyroxine  (SYNTHROID ) 25 MCG tablet, TAKE 2 TABLETS BY MOUTH 4 DAYS A WEEK, AND 1 TABLET FOR 3 DAYS A WEEK. TAKE DAILY BEFORE BREAKFAST., Disp: 150 tablet, Rfl: 0   omeprazole  (PRILOSEC) 40 MG capsule, TAKE 1 CAPSULE(40 MG) BY MOUTH DAILY, Disp: 90 capsule, Rfl: 1   QUEtiapine   (SEROQUEL ) 25 MG tablet, Take 1 tablet (25 mg total) by mouth at bedtime., Disp: 90 tablet, Rfl: 0   topiramate  (TOPAMAX ) 100 MG tablet, Take 1 tablet (100 mg total) by mouth 2 (two) times daily., Disp: 180 tablet, Rfl: 1   traMADol (ULTRAM) 50 MG tablet, Take 1 tablet by mouth 2 (two) times daily as needed., Disp: , Rfl:    Ubrogepant  (UBRELVY ) 100 MG TABS, Take 1 tablet (100 mg total) by mouth daily as needed., Disp: 16 tablet, Rfl: 2   valACYclovir  (VALTREX ) 500 MG tablet, TAKE 2 TABLETS BY MOUTH TWICE DAILY FOR 10 DAYS, Disp: 20 tablet, Rfl: 0   Bempedoic Acid-Ezetimibe (NEXLIZET ) 180-10 MG TABS, Take 1 tablet by mouth daily at 12 noon. (Patient not taking: Reported on 10/21/2023), Disp: 90 tablet, Rfl: 1  Allergies  Allergen Reactions   Codeine    Pantoprazole  Flu like syptoms    I personally reviewed active problem list, medication list, allergies, family history with the patient/caregiver today.   ROS  Ten systems reviewed and is negative except as mentioned in HPI    Objective Physical Exam CONSTITUTIONAL: Patient appears well-developed and well-nourished. No distress. HEENT: Head atraumatic, normocephalic, neck supple. CARDIOVASCULAR: Normal rate, regular rhythm and normal heart sounds. No murmur heard. No BLE edema. PULMONARY: Effort normal and breath sounds normal. No respiratory distress. ABDOMINAL: Abdomen is soft with normal bowel sounds. There is no tenderness or distention. MUSCULOSKELETAL: Normal gait. Without gross motor or sensory deficit. PSYCHIATRIC: Patient has a normal mood and affect. Behavior is normal. Judgment and thought content normal.  Vitals:   10/21/23 0857  BP: 118/72  Pulse: 95  Resp: 16  SpO2: 96%  Weight: 160 lb 8 oz (72.8 kg)  Height: 5\' 2"  (1.575 m)    Body mass index is 29.36 kg/m.  Recent Results (from the past 2160 hours)  HM MAMMOGRAPHY     Status: None   Collection Time: 09/13/23  9:59 AM  Result Value Ref Range   HM  Mammogram 0-4 Bi-Rad 0-4 Bi-Rad, Self Reported Normal    Comment: Abstracted by HIM    Diabetic Foot Exam:     PHQ2/9:    10/21/2023    8:56 AM 07/23/2023    8:09 AM 04/13/2023    8:58 AM 03/23/2023    7:49 AM 01/06/2023    7:43 AM  Depression screen PHQ 2/9  Decreased Interest 1 1 0 1 1  Down, Depressed, Hopeless 1 1 1  0 0  PHQ - 2 Score 2 2 1 1 1   Altered sleeping 2 2 3 1 3   Tired, decreased energy 0 2 3 1 3   Change in appetite 0 0 2 0 0  Feeling bad or failure about yourself  0 0 0 0 0  Trouble concentrating 1 0 2 0 3  Moving slowly or fidgety/restless 0 0 0 0 0  Suicidal thoughts 0 0 0 0 0  PHQ-9 Score 5 6 11 3 10   Difficult doing work/chores Somewhat difficult Not difficult at all Not difficult at all      phq 9 is positive  Fall Risk:    10/21/2023    8:53 AM 04/13/2023    8:57 AM 03/23/2023    7:48 AM 01/06/2023    7:43 AM 06/29/2022    7:52 AM  Fall Risk   Falls in the past year? 0 1 0 1 0  Number falls in past yr: 0 0 0 0 0  Injury with Fall? 0 1 0 1 0  Risk for fall due to : No Fall Risks History of fall(s) No Fall Risks No Fall Risks No Fall Risks  Follow up Falls prevention discussed;Education provided;Falls evaluation completed Education provided;Falls evaluation completed;Falls prevention discussed Falls prevention discussed Falls prevention discussed Falls prevention discussed      Assessment & Plan Acute gastroenteritis Acute gastroenteritis with watery diarrhea, fatigue, and weakness. Dehydration risk due to fluid intake difficulty. - Continue Imodium as needed. - Encourage hydration with electrolyte fluids. - Advise bland diet, avoid high sugar foods. - Avoid alcohol until recovery.  Chronic obstructive pulmonary disease (COPD) COPD well-controlled with Anoro and Combivent . Allergies may affect breathing. - Continue Anoro daily. - Refill Combivent  as needed.  Chronic back pain with L2-L3 disc bulge Chronic back pain managed with tramadol.  Pain varies with activity. No surgery or injections planned. - Continue tramadol  50 mg twice daily as needed.  Migraine Infrequent migraines triggered by barometric pressure changes. Managed with Topamax  and Ubrelvy . - Continue Topamax  100 mg twice daily. - Refill Ubrelvy  for acute episodes.  Major depressive disorder, recurrent, in partial remission Major depressive disorder in partial remission. Improved emotional well-being. PHQ-9 score decreased. - Continue duloxetine  as prescribed. - Continue Seroquel  for sleep.  Hypothyroidism Hypothyroidism managed with levothyroxine . No new symptoms reported. - Continue levothyroxine  as prescribed.  Prediabetes Prediabetes with A1c of 6.1%. Focus on diet and weight loss to prevent diabetes. - Continue dietary modifications to reduce carbohydrate intake.  Atherosclerosis of aorta Atherosclerosis with elevated LDL. Nexlizet  prescribed as non-statin alternative. Discussed non-statin nature and cost reduction voucher. - Start Nexlizet  with voucher. - Monitor lipid levels and cardiovascular risk factors.

## 2023-10-26 ENCOUNTER — Other Ambulatory Visit: Payer: Self-pay | Admitting: Family Medicine

## 2023-10-26 DIAGNOSIS — K219 Gastro-esophageal reflux disease without esophagitis: Secondary | ICD-10-CM

## 2023-10-26 DIAGNOSIS — Z9889 Other specified postprocedural states: Secondary | ICD-10-CM

## 2023-10-27 NOTE — Telephone Encounter (Signed)
 Too soon for refill.  Requested Prescriptions  Pending Prescriptions Disp Refills   omeprazole  (PRILOSEC) 40 MG capsule [Pharmacy Med Name: OMEPRAZOLE  40MG  CAPSULES] 90 capsule 1    Sig: TAKE 1 CAPSULE(40 MG) BY MOUTH DAILY     Gastroenterology: Proton Pump Inhibitors Passed - 10/27/2023  9:24 AM      Passed - Valid encounter within last 12 months    Recent Outpatient Visits           6 days ago Atherosclerosis of aorta [facility])   [facility] Arleen Lacer, MD   3 months ago Atherosclerosis of aorta [facility])   [facility] Arleen Lacer, MD       Future Appointments             In 2 months Ava Lei, Krich[place], MD [facility], [facility]

## 2023-11-22 ENCOUNTER — Telehealth: Payer: Self-pay

## 2023-11-22 ENCOUNTER — Telehealth: Payer: Self-pay | Admitting: Registered Nurse

## 2023-11-22 NOTE — Telephone Encounter (Signed)
 Good afternoon, Pt [id number] needs an appointment to see [facility]. thanks

## 2023-11-22 NOTE — Telephone Encounter (Signed)
 Walterine genre I had a message from shereka and she said to call this pt to put her on your schedule bc she needed refills I called her and she said she's never been seen by you and I also see she hasn't been seen in the clinic. Is this the correct patient?

## 2023-11-22 NOTE — Telephone Encounter (Signed)
 That is the wrong Corrin Pepper.

## 2023-12-23 ENCOUNTER — Telehealth: Payer: Self-pay

## 2023-12-23 NOTE — Telephone Encounter (Signed)
 Called pt to schedule orthotic pu. She said she called a while back to let us  know that she didn't want them because they are expensive. I gave her the option of a payment plan she opted out.

## 2023-12-31 ENCOUNTER — Other Ambulatory Visit: Payer: Self-pay | Admitting: Family Medicine

## 2023-12-31 DIAGNOSIS — J449 Chronic obstructive pulmonary disease, unspecified: Secondary | ICD-10-CM

## 2024-01-03 ENCOUNTER — Other Ambulatory Visit: Payer: Self-pay | Admitting: Family Medicine

## 2024-01-03 ENCOUNTER — Encounter: Payer: Self-pay | Admitting: Family Medicine

## 2024-01-03 MED ORDER — TOPIRAMATE 50 MG PO TABS: 100.0000 mg | ORAL_TABLET | Freq: Two times a day (BID) | ORAL | 0 refills | Status: DC

## 2024-01-13 ENCOUNTER — Ambulatory Visit: Payer: Self-pay | Admitting: Family Medicine

## 2024-01-13 ENCOUNTER — Encounter: Payer: Self-pay | Admitting: Family Medicine

## 2024-01-13 ENCOUNTER — Other Ambulatory Visit: Payer: Self-pay

## 2024-01-13 VITALS — BP 124/80 | HR 94 | Temp 98.2°F | Resp 16 | Ht 62.0 in | Wt 157.6 lb

## 2024-01-13 DIAGNOSIS — E039 Hypothyroidism, unspecified: Secondary | ICD-10-CM

## 2024-01-13 DIAGNOSIS — I2510 Atherosclerotic heart disease of native coronary artery without angina pectoris: Secondary | ICD-10-CM

## 2024-01-13 DIAGNOSIS — E785 Hyperlipidemia, unspecified: Secondary | ICD-10-CM

## 2024-01-13 DIAGNOSIS — R7303 Prediabetes: Secondary | ICD-10-CM

## 2024-01-13 DIAGNOSIS — K219 Gastro-esophageal reflux disease without esophagitis: Secondary | ICD-10-CM

## 2024-01-13 DIAGNOSIS — I700 Atherosclerosis of aorta: Secondary | ICD-10-CM

## 2024-01-13 DIAGNOSIS — G43009 Migraine without aura, not intractable, without status migrainosus: Secondary | ICD-10-CM

## 2024-01-13 DIAGNOSIS — F339 Major depressive disorder, recurrent, unspecified: Secondary | ICD-10-CM

## 2024-01-13 DIAGNOSIS — J449 Chronic obstructive pulmonary disease, unspecified: Secondary | ICD-10-CM

## 2024-01-13 DIAGNOSIS — N1831 Chronic kidney disease, stage 3a: Secondary | ICD-10-CM

## 2024-01-13 DIAGNOSIS — F900 Attention-deficit hyperactivity disorder, predominantly inattentive type: Secondary | ICD-10-CM

## 2024-01-13 MED ORDER — ROSUVASTATIN CALCIUM 5 MG PO TABS: 5.0000 mg | ORAL_TABLET | Freq: Every day | ORAL | 1 refills | Status: DC

## 2024-01-13 MED ORDER — AMPHETAMINE-DEXTROAMPHETAMINE 20 MG PO TABS: 20.0000 mg | ORAL_TABLET | Freq: Every day | ORAL | 0 refills | Status: DC

## 2024-01-13 MED ORDER — TRELEGY ELLIPTA 100-62.5-25 MCG/ACT IN AEPB: 1.0000 | INHALATION_SPRAY | Freq: Every day | RESPIRATORY_TRACT | 0 refills | Status: DC

## 2024-01-13 NOTE — Progress Notes (Signed)
 Name: Jane Patrick   MRN: [id number]    DOB: [birth year]   Date:01/13/2024       Progress Note  Subjective  Chief Complaint  Chief Complaint  Patient presents with   Medical Management of Chronic Issues   Discussed the use of AI scribe software for clinical note transcription with the patient, who gave verbal consent to proceed.  History of Present Illness Jane Patrick is a 63 year old female who presents for medication management due to lack of insurance.  She is experiencing difficulties managing her medications following a recent layoff on June 10th, which resulted in the loss of her insurance. She is unable to function without her stimulant medication, which she has been taking for ADD and major depression following the loss of her husband. Without the medication, she cannot focus, especially with upcoming job interviews.  She has been unable to afford her Anoro inhaler for COPD maintenance, which she has been without since the beginning of July. This has led to increased shortness of breath, particularly with activity. She has some Combivent  for rescue use, but it is not effective as a maintenance treatment. No increased cough or wheezing, and she does not bring up phlegm in the morning.  She has a history of high cholesterol, with a previous LDL of 133 in November of last year. She is not currently taking Nexlizet  medication due to cost concerns but willing to start a generic low dose statin   For her migraines, she has been taking Topamax , now at a reduced dose of 50 mg twice daily due to cost. She experiences migraines, which have been exacerbated by stress, and uses Ubrelvy  and cooling head wraps for relief. She reports a recent episode lasting four days, with symptoms including nausea and left-sided frontal headache.  She has prediabetes, with a previous A1c trending up but not specified. She is attempting to reduce sugar intake, particularly sweets, and does not report symptoms of  diabetes such as increased hunger, thirst, or urination.  Her hypothyroidism is managed with levothyroxine , taking 25 micrograms two tablets four days a week and one tablet the other days. She reports no symptoms of constipation or difficulty swallowing.  She has a history of major depression, which is chronic and recurrent. She is currently taking duloxetine  60 mg and has previously used Seroquel  for sleep, which she has stopped due to cost, discussed goodRx and switch pharmacies to get a better deal. She is experiencing stress from her job loss and financial difficulties.    Patient Active Problem List   Diagnosis Date Noted   Dyslipidemia 06/29/2022   Hyperglycemia 06/29/2022   Precordial pain 04/07/2022   Hyperlipidemia due to dietary fat intake 04/07/2022   Esophageal dysphagia    Hypothyroidism in adult 08/15/2017   Atherosclerosis of aorta (HCC) 09/22/2016   Hiatal hernia 09/22/2016   Abnormal liver CT 09/11/2016   H/O herpes simplex type 2 infection 08/20/2015   Hematuria 02/19/2015   B12 deficiency 02/19/2015   Attention deficit hyperactivity disorder (ADHD), predominantly inattentive type 11/13/2014   COPD, mild (HCC) 11/13/2014   Major depression in remission (HCC) 11/13/2014   History of basal cell carcinoma 11/13/2014   GERD without esophagitis 11/13/2014   History of abnormal cervical Pap smear 11/13/2014   Migraine without aura and without status migrainosus, not intractable 11/13/2014   Vitamin D  deficiency 11/13/2014   Liver lesion 10/04/2010    Past Surgical History:  Procedure Laterality Date   ABDOMINAL HYSTERECTOMY  2004  partial   BREAST SURGERY     biopsies   CT CTA CORONARY W/CA SCORE W/CM &/OR WO/CM  04/13/2022   No extracardiac abnormalities.  Coronary Calcium  Score 0.  Nonobstructive mild disease in the LAD (25 to 49%).  Otherwise no obstructive coronary disease with a right dominant system.   ESOPHAGOGASTRODUODENOSCOPY (EGD) WITH PROPOFOL  N/A  08/02/2018   Procedure: ESOPHAGOGASTRODUODENOSCOPY (EGD) WITH PROPOFOL ;  Surgeon: Unk Corinn Skiff, MD;  Location: [facility] ENDOSCOPY;  Service: Gastroenterology;  Laterality: N/A;   FRACTURE SURGERY     rode in femur   POLYPECTOMY     vocal chord polyps   ROBOTIC ASSISTED LAPAROSCOPIC CHOLECYSTECTOMY  07/22/2022   TRANSTHORACIC ECHOCARDIOGRAM  04/13/2022   Normal LV size and function.  EF 60 to 65%.  No RWMA.  GR 1 DD.  Normal RV.  Normal aortic and mitral valves.  Normal RAP.--NORMAL STUDY    Family History  Problem Relation Age of Onset   Colon cancer Father    Breast cancer Sister    Diverticulitis Brother    Diverticulitis Brother    Heart attack Maternal Grandmother 85    Social History   Tobacco Use   Smoking status: Former    Current packs/day: 0.00    Average packs/day: 1 pack/day for 31.0 years (31.0 ttl pk-yrs)    Types: Cigarettes    Start date: 05/30/1975    Quit date: 05/25/2004    Years since quitting: 19.6    Passive exposure: Past   Smokeless tobacco: Former  Substance Use Topics   Alcohol use: No    Alcohol/week: 0.0 standard drinks of alcohol     Current Outpatient Medications:    amphetamine -dextroamphetamine  (ADDERALL) 20 MG tablet, Take 1 tablet (20 mg total) by mouth daily with breakfast., Disp: 90 tablet, Rfl: 0   cholecalciferol (VITAMIN D ) 1000 UNITS tablet, Take 2,000 Units by mouth daily., Disp: , Rfl:    DULoxetine  (CYMBALTA ) 60 MG capsule, TAKE 1 CAPSULE(60 MG) BY MOUTH DAILY, Disp: 90 capsule, Rfl: 1   Ipratropium-Albuterol  (COMBIVENT  RESPIMAT) 20-100 MCG/ACT AERS respimat, Inhale 1 puff into the lungs every 6 (six) hours as needed for wheezing., Disp: 12 g, Rfl: 1   levothyroxine  (SYNTHROID ) 25 MCG tablet, TAKE 2 TABLETS BY MOUTH 4 DAYS A WEEK, AND 1 TABLET FOR 3 DAYS A WEEK. TAKE DAILY BEFORE BREAKFAST., Disp: 150 tablet, Rfl: 1   omeprazole  (PRILOSEC) 40 MG capsule, TAKE 1 CAPSULE(40 MG) BY MOUTH DAILY, Disp: 90 capsule, Rfl: 1   QUEtiapine   (SEROQUEL ) 25 MG tablet, Take 1 tablet (25 mg total) by mouth at bedtime., Disp: 90 tablet, Rfl: 1   topiramate  (TOPAMAX ) 50 MG tablet, Take 2 tablets (100 mg total) by mouth 2 (two) times daily., Disp: 360 tablet, Rfl: 0   traMADol (ULTRAM) 50 MG tablet, Take 1 tablet by mouth 2 (two) times daily as needed., Disp: , Rfl:    Ubrogepant  (UBRELVY ) 100 MG TABS, Take 1 tablet (100 mg total) by mouth daily as needed., Disp: 16 tablet, Rfl: 2   valACYclovir  (VALTREX ) 500 MG tablet, TAKE 2 TABLETS BY MOUTH TWICE DAILY FOR 10 DAYS, Disp: 20 tablet, Rfl: 0   ANORO ELLIPTA  62.5-25 MCG/ACT AEPB, Inhale 1 puff into the lungs daily. (Patient not taking: Reported on 01/13/2024), Disp: 180 each, Rfl: 1   Bempedoic Acid-Ezetimibe (NEXLIZET ) 180-10 MG TABS, Take 1 tablet by mouth daily at 12 noon. (Patient not taking: Reported on 10/21/2023), Disp: 90 tablet, Rfl: 1  Allergies  Allergen Reactions  Codeine    Pantoprazole     Flu like syptoms    I personally reviewed active problem list, medication list, allergies, family history with the patient/caregiver today.   ROS  Ten systems reviewed and is negative except as mentioned in HPI    Objective Physical Exam CONSTITUTIONAL: Patient appears well-developed and well-nourished.  No distress. HEENT: Head atraumatic, normocephalic, neck supple. CARDIOVASCULAR: Normal rate, regular rhythm and normal heart sounds.  No murmur heard. No BLE edema. PULMONARY: Effort normal and breath sounds normal. No respiratory distress. ABDOMINAL: There is no tenderness or distention. MUSCULOSKELETAL: Normal gait. Without gross motor or sensory deficit. PSYCHIATRIC: Patient has a normal mood and affect. behavior is normal. Judgment and thought content normal.  Vitals:   01/13/24 0852  BP: 124/80  Pulse: 94  Resp: 16  Temp: 98.2 F (36.8 C)  TempSrc: Oral  SpO2: 95%  Weight: 157 lb 9.6 oz (71.5 kg)  Height: 5' 2 (1.575 m)    Body mass index is 28.83  kg/m.    PHQ2/9:    10/21/2023    8:56 AM 07/23/2023    8:09 AM 04/13/2023    8:58 AM 03/23/2023    7:49 AM 01/06/2023    7:43 AM  Depression screen PHQ 2/9  Decreased Interest 1 1 0 1 1  Down, Depressed, Hopeless 1 1 1  0 0  PHQ - 2 Score 2 2 1 1 1   Altered sleeping 2 2 3 1 3   Tired, decreased energy 0 2 3 1 3   Change in appetite 0 0 2 0 0  Feeling bad or failure about yourself  0 0 0 0 0  Trouble concentrating 1 0 2 0 3  Moving slowly or fidgety/restless 0 0 0 0 0  Suicidal thoughts 0 0 0 0 0  PHQ-9 Score 5 6 11 3 10   Difficult doing work/chores Somewhat difficult Not difficult at all Not difficult at all      phq 9 is positive  Fall Risk:    10/21/2023    8:53 AM 04/13/2023    8:57 AM 03/23/2023    7:48 AM 01/06/2023    7:43 AM 06/29/2022    7:52 AM  Fall Risk   Falls in the past year? 0 1 0 1 0  Number falls in past yr: 0 0 0 0 0  Injury with Fall? 0 1 0 1 0  Risk for fall due to : No Fall Risks History of fall(s) No Fall Risks No Fall Risks No Fall Risks  Follow up Falls prevention discussed;Education provided;Falls evaluation completed Education provided;Falls evaluation completed;Falls prevention discussed Falls prevention discussed Falls prevention discussed Falls prevention discussed      Assessment & Plan Attention-deficit disorder and major depressive disorder, recurrent, chronic Stimulant and duloxetine  are essential for functioning, especially during stress from job loss. Considering trazodone for cost-effective sleep aid. - Continue duloxetine  60 mg daily. - Consider trazodone for sleep if needed. - Check seroquel  price at other pharmacies  - Send Adderall 20 mg  prescription to [facility] for cost savings. - Follow up in 3 months unless needed sooner.  Chronic obstructive pulmonary disease (COPD) Lack of Anoro has increased shortness of breath. Combivent  is insufficient for maintenance. Trelegy may be suitable if samples are available. - Provide  Trelegy samples  - Reassess if symptoms persist after resuming maintenance therapy.  Coronary artery disease without angina Unable to afford Nexlizet  or statins. LDL previously 133 mg/dL. Low-dose statin recommended to manage condition. -  Prescribe rosuvastatin  5 mg daily through GoodRx for cost savings. - Recommend taking 81 mg aspirin daily. - Monitor cholesterol levels and reassess in November when she has insurance.  Migraine Migraines exacerbated by stress. Ubrelvy  effective for acute episodes. Reduced Topamax  dose due to cost. - Continue Topamax  50 mg twice daily. - Continue using Ubrelvy  for acute episodes. - Use cooling devices for symptomatic relief.  Prediabetes A1c trending up. Actively reducing sugar intake and managing diet. - Continue dietary modifications to reduce sugar intake. - Monitor blood glucose levels and reassess A1c when insurance is available.  Hypothyroidism Current levothyroxine  dosing schedule effective. - Continue current levothyroxine  dosing schedule.  Gastroesophageal reflux disease (GERD) GERD well-controlled with omeprazole . - Continue omeprazole  as prescribed.

## 2024-01-20 ENCOUNTER — Encounter: Payer: Self-pay | Admitting: Family Medicine

## 2024-01-20 DIAGNOSIS — E039 Hypothyroidism, unspecified: Secondary | ICD-10-CM

## 2024-01-21 ENCOUNTER — Other Ambulatory Visit: Payer: Self-pay

## 2024-01-21 DIAGNOSIS — Z9889 Other specified postprocedural states: Secondary | ICD-10-CM

## 2024-01-21 DIAGNOSIS — K219 Gastro-esophageal reflux disease without esophagitis: Secondary | ICD-10-CM

## 2024-01-21 MED ORDER — OMEPRAZOLE 40 MG PO CPDR: 40.0000 mg | DELAYED_RELEASE_CAPSULE | Freq: Every day | ORAL | 0 refills | Status: DC

## 2024-01-28 MED ORDER — LEVOTHYROXINE SODIUM 25 MCG PO TABS: ORAL_TABLET | ORAL | 0 refills | Status: DC

## 2024-04-07 ENCOUNTER — Telehealth: Payer: Self-pay

## 2024-04-07 NOTE — Telephone Encounter (Signed)
 Patient called back. She did confirm with me that she does not want the Orthotics due to her insurance at the time not covering the Orthotics. She did decline the payment plan. As well as informing me that she currently does not have insurance. Unsure how you would like to proceed with this.

## 2024-04-07 NOTE — Telephone Encounter (Signed)
 I called patient to inform her the Orthotics are in at the [place] location. Unable to connect with patient. She was contacted earlier this year and it was documented in patients chart. Requested the patient give  us  a call back.

## 2024-04-13 ENCOUNTER — Other Ambulatory Visit: Payer: Self-pay | Admitting: Family Medicine

## 2024-04-13 DIAGNOSIS — Z9889 Other specified postprocedural states: Secondary | ICD-10-CM

## 2024-04-13 DIAGNOSIS — K219 Gastro-esophageal reflux disease without esophagitis: Secondary | ICD-10-CM

## 2024-04-14 NOTE — Telephone Encounter (Signed)
 Requested Prescriptions  Pending Prescriptions Disp Refills   topiramate  (TOPAMAX ) 50 MG tablet [Pharmacy Med Name: TOPIRAMATE  50 MG TABLET] 360 tablet 0    Sig: TAKE 2 TABLETS BY MOUTH 2 TIMES A DAY     Neurology: Anticonvulsants - topiramate  & zonisamide Failed - 04/14/2024  4:57 PM      Failed - Cr in normal range and within 360 days    Creat  Date Value Ref Range Status  04/13/2023 1.10 (H) 0.50 - 1.05 mg/dL Final         Failed - CO2 in normal range and within 360 days    CO2  Date Value Ref Range Status  04/13/2023 29 20 - 32 mmol/L Final         Failed - ALT in normal range and within 360 days    ALT  Date Value Ref Range Status  04/13/2023 9 6 - 29 U/L Final         Failed - AST in normal range and within 360 days    AST  Date Value Ref Range Status  04/13/2023 9 (L) 10 - 35 U/L Final         Passed - Completed PHQ-2 or PHQ-9 in the last 360 days      Passed - Valid encounter within last 12 months    Recent Outpatient Visits           3 months ago COPD, mild (HCC)   [facility] Glenard Mire, MD   5 months ago Atherosclerosis of aorta   [facility] Glenard Mire, MD   8 months ago Atherosclerosis of aorta   [facility] Glenard Mire, MD       Future Appointments             In 5 days Sowles, Krichna, MD [facility] [place]             omeprazole  (PRILOSEC) 40 MG capsule [Pharmacy Med Name: OMEPRAZOLE  DR 40 MG CAPSULE] 90 capsule 0    Sig: TAKE 1 CAPSULE BY MOUTH DAILY     Gastroenterology: Proton Pump Inhibitors Passed - 04/14/2024  4:57 PM      Passed - Valid encounter within last 12 months    Recent Outpatient Visits           3 months ago COPD, mild [facility] [place])   [facility] Glenard Mire, MD   5 months ago Atherosclerosis of aorta   [facility] Glenard Mire, MD   8  months ago Atherosclerosis of aorta   [facility] Glenard Mire, MD       Future Appointments             In 5 days Sowles, Krichna, MD [facility] [place]

## 2024-04-19 ENCOUNTER — Encounter: Payer: Self-pay | Admitting: Family Medicine

## 2024-04-19 ENCOUNTER — Ambulatory Visit: Payer: Self-pay | Admitting: Family Medicine

## 2024-04-19 VITALS — BP 136/84 | HR 80 | Resp 16 | Ht 62.0 in | Wt 155.5 lb

## 2024-04-19 DIAGNOSIS — F331 Major depressive disorder, recurrent, moderate: Secondary | ICD-10-CM

## 2024-04-19 DIAGNOSIS — F900 Attention-deficit hyperactivity disorder, predominantly inattentive type: Secondary | ICD-10-CM | POA: Diagnosis not present

## 2024-04-19 DIAGNOSIS — E039 Hypothyroidism, unspecified: Secondary | ICD-10-CM

## 2024-04-19 DIAGNOSIS — F5104 Psychophysiologic insomnia: Secondary | ICD-10-CM | POA: Diagnosis not present

## 2024-04-19 DIAGNOSIS — K219 Gastro-esophageal reflux disease without esophagitis: Secondary | ICD-10-CM | POA: Diagnosis not present

## 2024-04-19 DIAGNOSIS — E538 Deficiency of other specified B group vitamins: Secondary | ICD-10-CM | POA: Diagnosis not present

## 2024-04-19 DIAGNOSIS — I2510 Atherosclerotic heart disease of native coronary artery without angina pectoris: Secondary | ICD-10-CM | POA: Diagnosis not present

## 2024-04-19 DIAGNOSIS — N1831 Chronic kidney disease, stage 3a: Secondary | ICD-10-CM

## 2024-04-19 DIAGNOSIS — Z8619 Personal history of other infectious and parasitic diseases: Secondary | ICD-10-CM | POA: Diagnosis not present

## 2024-04-19 DIAGNOSIS — Z131 Encounter for screening for diabetes mellitus: Secondary | ICD-10-CM

## 2024-04-19 DIAGNOSIS — G43009 Migraine without aura, not intractable, without status migrainosus: Secondary | ICD-10-CM | POA: Diagnosis not present

## 2024-04-19 DIAGNOSIS — J449 Chronic obstructive pulmonary disease, unspecified: Secondary | ICD-10-CM | POA: Diagnosis not present

## 2024-04-19 DIAGNOSIS — E785 Hyperlipidemia, unspecified: Secondary | ICD-10-CM | POA: Diagnosis not present

## 2024-04-19 MED ORDER — LEVOTHYROXINE SODIUM 25 MCG PO TABS: ORAL_TABLET | ORAL | 0 refills | Status: DC

## 2024-04-19 MED ORDER — TOPIRAMATE 100 MG PO TABS: 100.0000 mg | ORAL_TABLET | Freq: Two times a day (BID) | ORAL | 1 refills | Status: AC

## 2024-04-19 MED ORDER — AMPHETAMINE-DEXTROAMPHETAMINE 20 MG PO TABS: 20.0000 mg | ORAL_TABLET | Freq: Every day | ORAL | 0 refills | Status: AC

## 2024-04-19 MED ORDER — TRAZODONE HCL 50 MG PO TABS: 25.0000 mg | ORAL_TABLET | Freq: Every evening | ORAL | 0 refills | Status: AC | PRN

## 2024-04-19 MED ORDER — ROSUVASTATIN CALCIUM 5 MG PO TABS: 5.0000 mg | ORAL_TABLET | Freq: Every day | ORAL | 1 refills | Status: AC

## 2024-04-19 MED ORDER — OMEPRAZOLE 40 MG PO CPDR: 40.0000 mg | DELAYED_RELEASE_CAPSULE | Freq: Every day | ORAL | 0 refills | Status: AC

## 2024-04-19 MED ORDER — VALACYCLOVIR HCL 500 MG PO TABS
500.0000 mg | ORAL_TABLET | Freq: Three times a day (TID) | ORAL | 0 refills | Status: AC
Start: 2024-04-19 — End: ?

## 2024-04-19 MED ORDER — DULOXETINE HCL 60 MG PO CPEP: ORAL_CAPSULE | ORAL | 1 refills | Status: AC

## 2024-04-19 NOTE — Progress Notes (Signed)
 Name: Jane Patrick   MRN: [id number]    DOB: 1961/05/03   Date:04/19/2024       Progress Note  Subjective  Chief Complaint  Chief Complaint  Patient presents with   Medical Management of Chronic Issues   Discussed the use of AI scribe software for clinical note transcription with the patient, who gave verbal consent to proceed.  History of Present Illness Jane Patrick is a 63 year old female with depression, ADHD, and COPD who presents for a follow-up and medication refills.  She has been unemployed for six months, affecting her ability to afford medications and health insurance. She is in the process of signing up for insurance through the healthcare marketplace.  Her depression was initially diagnosed after becoming a widow. She takes duloxetine  60 mg daily but continues to feel very down, depressed, or hopeless, with a lack of motivation and little pleasure in activities. She also experiences fatigue and low energy.  For ADHD, she takes immediate-release Adderall, which she finds effective for energy and motivation. She is currently out of Adderall and requests a refill. She does not experience side effects from this medication.  She has COPD and reports no cough, wheezing, or shortness of breath. She cannot afford inhalers and reports that Trelegy causes hoarseness. Anoro Ellipta  is her preferred inhaler, but it is expensive.  She takes rosuvastatin  for dyslipidemia, which she obtains affordably from [employer]. She also takes omeprazole  40 mg for reflux, with no current heartburn or indigestion.  She has a history of esophageal dilation but reports no current swallowing difficulties. She cannot afford Seroquel  for sleep but is open to trying trazodone  50 mg at night, as she now lives with her daughter and feels comfortable taking a sleep aid.  Her kidney function has been low, and she is due for blood work, including TSH and liver enzymes, due to her statin use. She takes  levothyroxine  for hypothyroidism, with no issues of hair loss or excessive dry skin, and follows a dosing schedule of two tablets four days a week and one tablet three days a week.  She experiences stress-related herpes outbreaks and requests a prescription for Valtrex , which she takes as needed for outbreaks. She describes feeling a burning sensation as a trigger for taking the medication.  She has a history of migraines and takes Topamax  for prevention, though she finds it expensive. She has some backup medication for acute treatment of migraines.    Patient Active Problem List   Diagnosis Date Noted   Dyslipidemia 06/29/2022   Hyperglycemia 06/29/2022   Precordial pain 04/07/2022   Hyperlipidemia due to dietary fat intake 04/07/2022   Esophageal dysphagia    Hypothyroidism in adult 08/15/2017   Atherosclerosis of aorta 09/22/2016   Hiatal hernia 09/22/2016   Abnormal liver CT 09/11/2016   H/O herpes simplex type 2 infection 08/20/2015   Hematuria 02/19/2015   B12 deficiency 02/19/2015   Attention deficit hyperactivity disorder (ADHD), predominantly inattentive type 11/13/2014   COPD, mild (HCC) 11/13/2014   Major depression in remission 11/13/2014   History of basal cell carcinoma 11/13/2014   GERD without esophagitis 11/13/2014   History of abnormal cervical Pap smear 11/13/2014   Migraine without aura and without status migrainosus, not intractable 11/13/2014   Vitamin D  deficiency 11/13/2014   Liver lesion 10/04/2010    Past Surgical History:  Procedure Laterality Date   ABDOMINAL HYSTERECTOMY  2004   partial   BREAST SURGERY     biopsies  CT CTA CORONARY W/CA SCORE W/CM &/OR WO/CM  04/13/2022   No extracardiac abnormalities.  Coronary Calcium  Score 0.  Nonobstructive mild disease in the LAD (25 to 49%).  Otherwise no obstructive coronary disease with a right dominant system.   ESOPHAGOGASTRODUODENOSCOPY (EGD) WITH PROPOFOL  N/A 08/02/2018   Procedure:  ESOPHAGOGASTRODUODENOSCOPY (EGD) WITH PROPOFOL ;  Surgeon: Unk Corinn Skiff, MD;  Location: [facility] ENDOSCOPY;  Service: Gastroenterology;  Laterality: N/A;   FRACTURE SURGERY     rode in femur   POLYPECTOMY     vocal chord polyps   ROBOTIC ASSISTED LAPAROSCOPIC CHOLECYSTECTOMY  07/22/2022   TRANSTHORACIC ECHOCARDIOGRAM  04/13/2022   Normal LV size and function.  EF 60 to 65%.  No RWMA.  GR 1 DD.  Normal RV.  Normal aortic and mitral valves.  Normal RAP.--NORMAL STUDY    Family History  Problem Relation Age of Onset   Colon cancer Father    Breast cancer Sister    Diverticulitis Brother    Diverticulitis Brother    Heart attack Maternal Grandmother 30    Social History   Tobacco Use   Smoking status: Former    Current packs/day: 0.00    Average packs/day: 1 pack/day for 31.0 years (31.0 ttl pk-yrs)    Types: Cigarettes    Start date: 05/30/1975    Quit date: 05/25/2004    Years since quitting: 19.9    Passive exposure: Past   Smokeless tobacco: Former  Substance Use Topics   Alcohol use: No    Alcohol/week: 0.0 standard drinks of alcohol     Current Outpatient Medications:    cholecalciferol (VITAMIN D ) 1000 UNITS tablet, Take 2,000 Units by mouth daily., Disp: , Rfl:    Ipratropium-Albuterol  (COMBIVENT  RESPIMAT) 20-100 MCG/ACT AERS respimat, Inhale 1 puff into the lungs every 6 (six) hours as needed for wheezing., Disp: 12 g, Rfl: 1   QUEtiapine  (SEROQUEL ) 25 MG tablet, Take 1 tablet (25 mg total) by mouth at bedtime., Disp: 90 tablet, Rfl: 1   traMADol (ULTRAM) 50 MG tablet, Take 1 tablet by mouth 2 (two) times daily as needed., Disp: , Rfl:    Ubrogepant  (UBRELVY ) 100 MG TABS, Take 1 tablet (100 mg total) by mouth daily as needed., Disp: 16 tablet, Rfl: 2   amphetamine -dextroamphetamine  (ADDERALL) 20 MG tablet, Take 1 tablet (20 mg total) by mouth daily with breakfast., Disp: 90 tablet, Rfl: 0   DULoxetine  (CYMBALTA ) 60 MG capsule, TAKE 1 CAPSULE(60 MG) BY MOUTH DAILY, Disp:  90 capsule, Rfl: 1   levothyroxine  (SYNTHROID ) 25 MCG tablet, TAKE 2 TABLETS BY MOUTH 4 DAYS A WEEK, AND 1 TABLET FOR 3 DAYS A WEEK. TAKE DAILY BEFORE BREAKFAST., Disp: 150 tablet, Rfl: 0   omeprazole  (PRILOSEC) 40 MG capsule, Take 1 capsule (40 mg total) by mouth daily., Disp: 90 capsule, Rfl: 0   rosuvastatin  (CRESTOR ) 5 MG tablet, Take 1 tablet (5 mg total) by mouth daily., Disp: 90 tablet, Rfl: 1   topiramate  (TOPAMAX ) 100 MG tablet, Take 1 tablet (100 mg total) by mouth 2 (two) times daily., Disp: 180 tablet, Rfl: 1   traZODone  (DESYREL ) 50 MG tablet, Take 0.5-1 tablets (25-50 mg total) by mouth at bedtime as needed for sleep., Disp: 90 tablet, Rfl: 0   valACYclovir  (VALTREX ) 500 MG tablet, Take 1 tablet (500 mg total) by mouth 3 (three) times daily., Disp: 30 tablet, Rfl: 0  Allergies  Allergen Reactions   Codeine    Pantoprazole     Flu like syptoms  I personally reviewed active problem list, medication list, allergies, family history with the patient/caregiver today.   ROS  Ten systems reviewed and is negative except as mentioned in HPI    Objective Physical Exam CONSTITUTIONAL: Patient appears well-developed and well-nourished.  No distress. HEENT: Head atraumatic, normocephalic, neck supple. CARDIOVASCULAR: Normal rate, regular rhythm and normal heart sounds.  No murmur heard. No BLE edema. PULMONARY: Effort normal and breath sounds normal. No respiratory distress. ABDOMINAL: There is no tenderness or distention. MUSCULOSKELETAL: Normal gait. Without gross motor or sensory deficit. PSYCHIATRIC: Patient has a normal mood and affect. behavior is normal. Judgment and thought content normal.  Vitals:   04/19/24 0827  BP: 136/84  Pulse: 80  Resp: 16  SpO2: 93%  Weight: 155 lb 8 oz (70.5 kg)  Height: 5' 2 (1.575 m)    Body mass index is 28.44 kg/m.    PHQ2/9:    04/19/2024    8:24 AM 10/21/2023    8:56 AM 07/23/2023    8:09 AM 04/13/2023    8:58 AM 03/23/2023     7:49 AM  Depression screen PHQ 2/9  Decreased Interest 3 1 1  0 1  Down, Depressed, Hopeless 3 1 1 1  0  PHQ - 2 Score 6 2 2 1 1   Altered sleeping 0 2 2 3 1   Tired, decreased energy 3 0 2 3 1   Change in appetite 0 0 0 2 0  Feeling bad or failure about yourself  0 0 0 0 0  Trouble concentrating 0 1 0 2 0  Moving slowly or fidgety/restless 0 0 0 0 0  Suicidal thoughts 0 0 0 0 0  PHQ-9 Score 9 5  6  11  3    Difficult doing work/chores Somewhat difficult Somewhat difficult Not difficult at all Not difficult at all      Data saved with a previous flowsheet row definition    phq 9 is negative  Fall Risk:    04/19/2024    8:24 AM 10/21/2023    8:53 AM 04/13/2023    8:57 AM 03/23/2023    7:48 AM 01/06/2023    7:43 AM  Fall Risk   Falls in the past year? 0 0 1 0 1  Number falls in past yr: 0 0 0 0 0  Injury with Fall? 0 0 1 0 1  Risk for fall due to : No Fall Risks No Fall Risks History of fall(s) No Fall Risks No Fall Risks  Follow up Falls evaluation completed Falls prevention discussed;Education provided;Falls evaluation completed Education provided;Falls evaluation completed;Falls prevention discussed Falls prevention discussed Falls prevention discussed    Assessment & Plan Chronic obstructive pulmonary disease (COPD) Mild COPD, intolerant to Trelegy, prefers Anoro Ellipta  but cost is an issue. - Discontinued Trelegy. - Advised to contact when insurance is obtained to resume Anoro Ellipta .  Attention-deficit hyperactivity disorder, predominantly inattentive type ADHD managed with Adderall, no side effects, medication depleted. - Refilled Adderall immediate release.  Chronic kidney disease, stage 3a Stage 3a CKD with declining kidney function, no NSAID use. - Ordered CBC, kidney function tests, liver enzymes, and lipid panel. - Advised to avoid NSAIDs.  Atherosclerotic heart disease of native coronary artery and hyperlipidemia Managed with rosuvastatin , no cost  issues. - Continue rosuvastatin .  Hypothyroidism Managed with levothyroxine , no symptoms of dysphagia, hair loss, or excessive dry skin. - Continue levothyroxine  regimen. - Ordered TSH test.  Major depressive disorder and insomnia Major depressive disorder with anhedonia, insomnia, unable to afford  Seroquel , trazodone  suggested for sleep. - Prescribed trazodone  50 mg at night for sleep. - Continue duloxetine  60 mg daily.  Gastroesophageal reflux disease (GERD) GERD managed with omeprazole , asymptomatic. - Continue omeprazole  40 mg.  Migraine Migraines managed with Topamax , cost issue, Ubrelvy  available for acute attacks. - Continue Topamax  for migraine prevention. - Use Ubrelvy  as needed for acute migraine attacks.  Herpes simplex virus infection (recurrent) Recurrent herpes simplex virus infection, stress as a trigger. - Prescribed Valtrex  500 mg TID for 30 days for outbreaks.  Vitamin B12 and folate deficiency Vitamin B12 and folate deficiency, inconsistent B12 supplement use due to cost. - Encouraged consistent use of B12 supplement.

## 2024-04-23 ENCOUNTER — Other Ambulatory Visit: Payer: Self-pay | Admitting: Family Medicine

## 2024-04-23 DIAGNOSIS — E039 Hypothyroidism, unspecified: Secondary | ICD-10-CM

## 2024-04-26 NOTE — Telephone Encounter (Signed)
.   Requested Prescriptions  Pending Prescriptions Disp Refills   levothyroxine  (SYNTHROID ) 25 MCG tablet [Pharmacy Med Name: LEVOTHYROXINE  0.025MG  ( ) TAB] 150 tablet 0    Sig: TAKE 2 TABLETS BY MOUTH 4 DAYS A WEEK AND TAKE 1 TABLET FOR 3 DAYS A WEEK. TAKE DAILY BEFORE BREAKFST     Endocrinology:  Hypothyroid Agents Failed - 04/26/2024  2:06 PM      Failed - TSH in normal range and within 360 days    TSH  Date Value Ref Range Status  04/13/2023 3.43 0.40 - 4.50 mIU/L Final         Passed - Valid encounter within last 12 months    Recent Outpatient Visits           1 week ago COPD, mild [facility])   [facility] Glenard Mire, MD   3 months ago COPD, mild [facility])   [facility] Glenard Mire, MD   6 months ago Atherosclerosis of aorta   [facility] Glenard Mire, MD   9 months ago Atherosclerosis of aorta   [facility] Sowles, Krichna, MD

## 2024-05-03 ENCOUNTER — Encounter: Payer: Self-pay | Admitting: Family Medicine

## 2024-05-03 MED ORDER — UMECLIDINIUM-VILANTEROL 62.5-25 MCG/ACT IN AEPB: 1.0000 | INHALATION_SPRAY | Freq: Every day | RESPIRATORY_TRACT | 2 refills | Status: AC

## 2024-06-01 ENCOUNTER — Ambulatory Visit: Payer: Self-pay | Admitting: Family Medicine

## 2024-06-01 LAB — CBC WITH DIFFERENTIAL/PLATELET
Absolute Lymphocytes: 1603 {cells}/uL (ref 850–3900)
Absolute Monocytes: 348 {cells}/uL (ref 200–950)
Basophils Absolute: 19 {cells}/uL (ref 0–200)
Basophils Relative: 0.4 %
Eosinophils Absolute: 61 {cells}/uL (ref 15–500)
Eosinophils Relative: 1.3 %
HCT: 43 % (ref 35.9–46.0)
Hemoglobin: 13.7 g/dL (ref 11.7–15.5)
MCH: 26.2 pg — ABNORMAL LOW (ref 27.0–33.0)
MCHC: 31.9 g/dL (ref 31.6–35.4)
MCV: 82.4 fL (ref 81.4–101.7)
MPV: 9.5 fL (ref 7.5–12.5)
Monocytes Relative: 7.4 %
Neutro Abs: 2670 {cells}/uL (ref 1500–7800)
Neutrophils Relative %: 56.8 %
Platelets: 222 Thousand/uL (ref 140–400)
RBC: 5.22 Million/uL — ABNORMAL HIGH (ref 3.80–5.10)
RDW: 14.6 % (ref 11.0–15.0)
Total Lymphocyte: 34.1 %
WBC: 4.7 Thousand/uL (ref 3.8–10.8)

## 2024-06-01 LAB — B12 AND FOLATE PANEL
Folate: 10 ng/mL
Vitamin B-12: 424 pg/mL (ref 200–1100)

## 2024-06-01 LAB — COMPREHENSIVE METABOLIC PANEL WITH GFR
AG Ratio: 1.8 (calc) (ref 1.0–2.5)
ALT: 9 U/L (ref 6–29)
AST: 12 U/L (ref 10–35)
Albumin: 4.5 g/dL (ref 3.6–5.1)
Alkaline phosphatase (APISO): 109 U/L (ref 37–153)
BUN: 22 mg/dL (ref 7–25)
CO2: 28 mmol/L (ref 20–32)
Calcium: 9 mg/dL (ref 8.6–10.4)
Chloride: 104 mmol/L (ref 98–110)
Creat: 0.95 mg/dL (ref 0.50–1.05)
Globulin: 2.5 g/dL (ref 1.9–3.7)
Glucose, Bld: 87 mg/dL (ref 65–99)
Potassium: 3.7 mmol/L (ref 3.5–5.3)
Sodium: 140 mmol/L (ref 135–146)
Total Bilirubin: 0.6 mg/dL (ref 0.2–1.2)
Total Protein: 7 g/dL (ref 6.1–8.1)
eGFR: 68 mL/min/1.73m2

## 2024-06-01 LAB — LIPID PANEL
Cholesterol: 146 mg/dL
HDL: 46 mg/dL — ABNORMAL LOW
LDL Cholesterol (Calc): 76 mg/dL
Non-HDL Cholesterol (Calc): 100 mg/dL
Total CHOL/HDL Ratio: 3.2 (calc)
Triglycerides: 143 mg/dL

## 2024-06-01 LAB — HEMOGLOBIN A1C
Hgb A1c MFr Bld: 6.1 % — ABNORMAL HIGH
Mean Plasma Glucose: 128 mg/dL
eAG (mmol/L): 7.1 mmol/L

## 2024-06-01 LAB — TSH: TSH: 1.75 m[IU]/L (ref 0.40–4.50)

## 2024-06-08 ENCOUNTER — Telehealth: Payer: Self-pay

## 2024-06-08 NOTE — Telephone Encounter (Signed)
 Called patient to follow up she states she does not want the orthotics, I told her I would write a note and let Trish know.

## 2024-10-13 ENCOUNTER — Ambulatory Visit: Payer: Self-pay | Admitting: Family Medicine
